# Patient Record
Sex: Male | Born: 1965
Health system: Southern US, Community
[De-identification: ages and names within clinical notes are randomized; demographics above are authoritative.]

## PROBLEM LIST (undated history)

## (undated) DIAGNOSIS — F32A Depression, unspecified: Secondary | ICD-10-CM

## (undated) DIAGNOSIS — T753XXA Motion sickness, initial encounter: Secondary | ICD-10-CM

## (undated) DIAGNOSIS — F419 Anxiety disorder, unspecified: Secondary | ICD-10-CM

## (undated) DIAGNOSIS — M792 Neuralgia and neuritis, unspecified: Secondary | ICD-10-CM

## (undated) DIAGNOSIS — R29898 Other symptoms and signs involving the musculoskeletal system: Secondary | ICD-10-CM

## (undated) DIAGNOSIS — K645 Perianal venous thrombosis: Secondary | ICD-10-CM

## (undated) DIAGNOSIS — E785 Hyperlipidemia, unspecified: Secondary | ICD-10-CM

## (undated) DIAGNOSIS — M5416 Radiculopathy, lumbar region: Secondary | ICD-10-CM

## (undated) DIAGNOSIS — R454 Irritability and anger: Secondary | ICD-10-CM

## (undated) DIAGNOSIS — T7840XA Allergy, unspecified, initial encounter: Secondary | ICD-10-CM

## (undated) DIAGNOSIS — R42 Dizziness and giddiness: Secondary | ICD-10-CM

## (undated) DIAGNOSIS — G545 Neuralgic amyotrophy: Secondary | ICD-10-CM

## (undated) DIAGNOSIS — M1990 Unspecified osteoarthritis, unspecified site: Secondary | ICD-10-CM

## (undated) DIAGNOSIS — M5126 Other intervertebral disc displacement, lumbar region: Secondary | ICD-10-CM

## (undated) DIAGNOSIS — I10 Essential (primary) hypertension: Secondary | ICD-10-CM

## (undated) DIAGNOSIS — Z972 Presence of dental prosthetic device (complete) (partial): Secondary | ICD-10-CM

## (undated) DIAGNOSIS — F329 Major depressive disorder, single episode, unspecified: Secondary | ICD-10-CM

## (undated) HISTORY — DX: Hyperlipidemia, unspecified: E78.5

## (undated) HISTORY — DX: Essential (primary) hypertension: I10

## (undated) HISTORY — DX: Major depressive disorder, single episode, unspecified: F32.9

## (undated) HISTORY — DX: Allergy, unspecified, initial encounter: T78.40XA

## (undated) HISTORY — DX: Perianal venous thrombosis: K64.5

## (undated) HISTORY — DX: Anxiety disorder, unspecified: F41.9

## (undated) HISTORY — DX: Irritability and anger: R45.4

## (undated) HISTORY — DX: Depression, unspecified: F32.A

---

## 1998-12-15 ENCOUNTER — Encounter: Payer: Self-pay | Admitting: Family Medicine

## 1998-12-15 LAB — CONVERTED CEMR LAB
Blood Glucose, Fasting: 87 mg/dL
RBC count: 5.25 10*6/uL
TSH: 0.74 microintl units/mL

## 1999-11-17 ENCOUNTER — Encounter: Payer: Self-pay | Admitting: Family Medicine

## 1999-11-17 LAB — CONVERTED CEMR LAB: Rapid Strep: POSITIVE

## 2000-07-22 ENCOUNTER — Encounter: Payer: Self-pay | Admitting: Family Medicine

## 2000-07-22 LAB — CONVERTED CEMR LAB: TSH: 1.08 microintl units/mL

## 2004-05-08 ENCOUNTER — Ambulatory Visit: Payer: Self-pay | Admitting: Family Medicine

## 2004-10-16 ENCOUNTER — Ambulatory Visit: Payer: Self-pay | Admitting: Family Medicine

## 2004-10-30 ENCOUNTER — Ambulatory Visit: Payer: Self-pay | Admitting: Family Medicine

## 2005-03-22 ENCOUNTER — Ambulatory Visit: Payer: Self-pay | Admitting: Family Medicine

## 2005-10-29 ENCOUNTER — Ambulatory Visit: Payer: Self-pay | Admitting: Family Medicine

## 2005-10-29 LAB — CONVERTED CEMR LAB
Blood Glucose, Fasting: 101 mg/dL
TSH: 1.29 microintl units/mL

## 2005-12-05 ENCOUNTER — Ambulatory Visit: Payer: Self-pay | Admitting: Family Medicine

## 2005-12-05 LAB — CONVERTED CEMR LAB: Rapid Strep: NEGATIVE

## 2007-08-13 ENCOUNTER — Ambulatory Visit: Payer: Self-pay | Admitting: Family Medicine

## 2007-08-13 ENCOUNTER — Encounter: Payer: Self-pay | Admitting: Family Medicine

## 2007-08-13 DIAGNOSIS — F341 Dysthymic disorder: Secondary | ICD-10-CM

## 2007-08-13 DIAGNOSIS — T7840XA Allergy, unspecified, initial encounter: Secondary | ICD-10-CM | POA: Insufficient documentation

## 2007-08-13 DIAGNOSIS — R0602 Shortness of breath: Secondary | ICD-10-CM

## 2007-08-17 ENCOUNTER — Emergency Department (HOSPITAL_COMMUNITY): Admission: EM | Admit: 2007-08-17 | Discharge: 2007-08-17 | Payer: Self-pay | Admitting: Emergency Medicine

## 2007-08-17 ENCOUNTER — Encounter: Payer: Self-pay | Admitting: Family Medicine

## 2007-08-21 ENCOUNTER — Ambulatory Visit: Payer: Self-pay | Admitting: Family Medicine

## 2007-09-03 ENCOUNTER — Encounter: Payer: Self-pay | Admitting: Family Medicine

## 2007-10-16 ENCOUNTER — Encounter: Payer: Self-pay | Admitting: Family Medicine

## 2007-11-19 ENCOUNTER — Encounter: Payer: Self-pay | Admitting: Family Medicine

## 2007-12-17 ENCOUNTER — Telehealth: Payer: Self-pay | Admitting: Family Medicine

## 2007-12-22 ENCOUNTER — Telehealth: Payer: Self-pay | Admitting: Family Medicine

## 2008-05-05 ENCOUNTER — Encounter: Payer: Self-pay | Admitting: Family Medicine

## 2008-05-31 ENCOUNTER — Ambulatory Visit: Payer: Self-pay | Admitting: Family Medicine

## 2008-08-26 ENCOUNTER — Ambulatory Visit: Payer: Self-pay | Admitting: Family Medicine

## 2008-12-16 ENCOUNTER — Ambulatory Visit: Payer: Self-pay | Admitting: Family Medicine

## 2008-12-27 ENCOUNTER — Telehealth: Payer: Self-pay | Admitting: Family Medicine

## 2009-04-27 ENCOUNTER — Ambulatory Visit: Payer: Self-pay | Admitting: Family Medicine

## 2009-05-04 ENCOUNTER — Ambulatory Visit: Payer: Self-pay | Admitting: Family Medicine

## 2009-05-04 DIAGNOSIS — E785 Hyperlipidemia, unspecified: Secondary | ICD-10-CM

## 2009-05-06 ENCOUNTER — Ambulatory Visit: Payer: Self-pay | Admitting: Family Medicine

## 2009-05-09 LAB — CONVERTED CEMR LAB
AST: 21 units/L (ref 0–37)
Albumin: 4.4 g/dL (ref 3.5–5.2)
Alkaline Phosphatase: 47 units/L (ref 39–117)
BUN: 18 mg/dL (ref 6–23)
Basophils Absolute: 0 10*3/uL (ref 0.0–0.1)
CO2: 30 meq/L (ref 19–32)
Calcium: 9.4 mg/dL (ref 8.4–10.5)
Cholesterol: 234 mg/dL — ABNORMAL HIGH (ref 0–200)
Creatinine, Ser: 1 mg/dL (ref 0.4–1.5)
Direct LDL: 173.9 mg/dL
Eosinophils Absolute: 0 10*3/uL (ref 0.0–0.7)
GFR calc non Af Amer: 86.33 mL/min (ref >60)
Glucose, Bld: 111 mg/dL — ABNORMAL HIGH (ref 70–99)
HDL: 45.7 mg/dL (ref >39.00)
Hemoglobin: 14.2 g/dL (ref 13.0–17.0)
Lymphocytes Relative: 13.5 % (ref 12.0–46.0)
MCHC: 33.3 g/dL (ref 30.0–36.0)
Monocytes Relative: 4.4 % (ref 3.0–12.0)
Neutro Abs: 5.7 10*3/uL (ref 1.4–7.7)
Neutrophils Relative %: 81.8 % — ABNORMAL HIGH (ref 43.0–77.0)
Platelets: 288 10*3/uL (ref 150.0–400.0)
RDW: 12.2 % (ref 11.5–14.6)
Sodium: 138 meq/L (ref 135–145)
Total Bilirubin: 0.9 mg/dL (ref 0.3–1.2)
Triglycerides: 86 mg/dL (ref 0.0–149.0)

## 2009-05-11 ENCOUNTER — Ambulatory Visit: Payer: Self-pay | Admitting: Family Medicine

## 2009-06-10 ENCOUNTER — Ambulatory Visit: Payer: Self-pay | Admitting: Family Medicine

## 2009-06-12 LAB — CONVERTED CEMR LAB
ALT: 26 units/L (ref 0–53)
AST: 25 units/L (ref 0–37)

## 2009-06-27 ENCOUNTER — Ambulatory Visit: Payer: Self-pay | Admitting: Family Medicine

## 2009-06-27 DIAGNOSIS — K645 Perianal venous thrombosis: Secondary | ICD-10-CM

## 2009-06-27 DIAGNOSIS — H60399 Other infective otitis externa, unspecified ear: Secondary | ICD-10-CM | POA: Insufficient documentation

## 2009-07-25 ENCOUNTER — Ambulatory Visit: Payer: Self-pay | Admitting: Family Medicine

## 2009-07-25 LAB — CONVERTED CEMR LAB
AST: 26 units/L (ref 0–37)
Total CHOL/HDL Ratio: 5
VLDL: 13.4 mg/dL (ref 0.0–40.0)

## 2009-07-28 ENCOUNTER — Ambulatory Visit: Payer: Self-pay | Admitting: Family Medicine

## 2009-11-01 ENCOUNTER — Encounter (INDEPENDENT_AMBULATORY_CARE_PROVIDER_SITE_OTHER): Payer: Self-pay | Admitting: *Deleted

## 2009-11-04 ENCOUNTER — Ambulatory Visit: Payer: Self-pay | Admitting: Family Medicine

## 2009-11-04 DIAGNOSIS — R454 Irritability and anger: Secondary | ICD-10-CM | POA: Insufficient documentation

## 2010-01-20 ENCOUNTER — Telehealth: Payer: Self-pay | Admitting: Family Medicine

## 2010-01-20 ENCOUNTER — Encounter: Payer: Self-pay | Admitting: Family Medicine

## 2010-03-14 ENCOUNTER — Ambulatory Visit: Payer: Self-pay | Admitting: Family Medicine

## 2010-03-14 DIAGNOSIS — J019 Acute sinusitis, unspecified: Secondary | ICD-10-CM

## 2010-04-25 NOTE — Assessment & Plan Note (Signed)
Summary: INNER EAR/CLE   Vital Signs:  Patient profile:   45 year old male Weight:      216 pounds Temp:     98.5 degrees F oral Pulse rate:   64 / minute Pulse rhythm:   regular BP sitting:   108 / 68  (left arm) Cuff size:   large  Vitals Entered By: Sydell Axon LPN (June 27, 1608 12:24 PM) CC: Severe pain in right ear   History of Present Illness: His right ear hurts when he bites and swallows.  He has no fever or chills, no headache no congestion, no ST, no rhinitis. He has some hay fever and sneezing. No cough. Since reading Protein [employer]wer, he started using it and has lost lots of weight and has pain with buring and knife/needle sticking on the rectum. It is a sensation he has all the time. Sittiing hurts, not sure if this started with the dietary changes. He is eating lots of protein and trying to limit carbs to 40mg . Hard to do.  He thinks he is getting plenty of fiber but admits to having a hard stool occasionally.  Problems Prior to Update: 1)  Health Maintenance Exam  (ICD-V70.0) 2)  Special Screening Malignant Neoplasm of Prostate  (ICD-V76.44) 3)  Hyperlipidemia  (ICD-272.4) 4)  Asthma (DR [facility])  (ICD-493.90) 5)  Allergic Rhinitis, Vasomotor  (ICD-477.9) 6)  Depression/anxiety  (ICD-300.4) 7)  Sob  (ICD-786.05) 8)  Allergy, Environmental  (ICD-995.3)  Medications Prior to Update: 1)  Fluoxetine Hcl 20 Mg  Caps (Fluoxetine Hcl) .... Take One By Mouth Daily 2)  Advair Diskus 100-50 Mcg/dose Aepb (Fluticasone-Salmeterol) .... One Inh Two Times A Day 3)  Optivar 0.05 % Soln (Azelastine Hcl) .... One Drop Each Eye As Needed 4)  Proair Hfa 108 (90 Base) Mcg/act Aers (Albuterol Sulfate) .... As Needed 5)  Prednisone 10 Mg Tabs (Prednisone) .... 5 Tabs By Mouth Today, Then Decrease By 1 Every Two Days Until Finally On 1/2 Tab For Four Days. 6)  Simvastatin 40 Mg Tabs (Simvastatin) .... One Tab By Mouth At Night  Allergies: No Known Drug Allergies  Physical  Exam  General:  Well-developed,well-nourished,in no acute distress; alert,appropriate and cooperative throughout examination, noncongested. Head:  normocephalic, atraumatic, and no abnormalities observed. Sinuses NT. Eyes:  Conjunctiva clear bilaterally.  Ears:  External ear exam shows no significant lesions or deformities but has significant discomfort with mobilization of the pinna on the right. .  Otoscopic examination reveals clear canal on the left, mildly inflame rweasonably torturous canal on the right, tympanic membranes are intact bilaterally without bulging, retraction, inflammation or discharge. Hearing is grossly normal bilaterally. Nose:  nares are clear. Mouth:  pharynx pink and moist, no erythema, and no exudates.   Lungs:  Normal respiratory effort, chest expands symmetrically. Lungs are clear to auscultation, no crackles or wheezes. Heart:  Normal rate and regular rhythm. S1 and S2 normal without gallop, murmur, click, rub or other extra sounds. Abdomen:  Bowel sounds positive,abdomen soft and non-tender without masses, organomegaly or hernias noted. Rectal:  No external abnormalities noted. Normal sphincter tone. No rectal masses but significant discomfort with digital exam.  Anoscopy shows fairly circumferentail internal hemm with irritation in the cana, c/w discomfort he experiences in location.   Impression & Recommendations:  Problem # 1:  OTITIS EXTERNA, RIGHT (ICD-380.10) Assessment New  Use Cortisporin Otic His updated medication list for this problem includes:    Cortisporin 3.5-10000-1 Soln (Neomycin-polymyxin-hc) .Marland Kitchen... 2 gtts right  ear three times a day for minimum of one week.  Discussed symptomatic treatment and preventive measures.   Problem # 2:  HEMORRHOIDS, INTERNAL, THROMBOSED (ICD-455.1) Assessment: New  Use Ab=nusol and Sitz baths. Add more fiober to diet...suggested Citrucel.  Orders: Anoscopy (28413)  Complete Medication List: 1)  Fluoxetine  Hcl 20 Mg Caps (Fluoxetine hcl) .... Take one by mouth daily 2)  Advair Diskus 100-50 Mcg/dose Aepb (Fluticasone-salmeterol) .... One inh two times a day 3)  Optivar 0.05 % Soln (Azelastine hcl) .... One drop each eye as needed 4)  Proair Hfa 108 (90 Base) Mcg/act Aers (Albuterol sulfate) .... As needed 5)  Simvastatin 40 Mg Tabs (Simvastatin) .... One tab by mouth at night 6)  Anusol-hc 25 Mg Supp (Hydrocortisone acetate) .... One supp per rectum for one week then as needed 7)  Cortisporin 3.5-10000-1 Soln (Neomycin-polymyxin-hc) .... 2 gtts right ear three times a day for minimum of one week.  Patient Instructions: 1)  Use Cortisporin as dir. 2)  Use anusol HC and Citrucel, as dir. Prescriptions: CORTISPORIN 3.5-10000-1 SOLN (NEOMYCIN-POLYMYXIN-HC) 2 gtts right ear three times a day for minimum of one week.  #1 bottle x 0   Entered and Authorized by:   Shaune Leeks MD   Signed by:   Shaune Leeks MD on 06/27/2009   Method used:   [facility]        [facility]  [id number]ret[facility]       [address]       [place], [place]  [address]       Ph: [phone] or [phone]       Fax: [phone]   RxID:   [id number] ANUSOL-HC 25 MG SUPP (HYDROCORTISONE ACETATE) one supp per rectum for one week then as needed  #30 x 6   Entered and Authorized by:   Shaune Leeks MD   Signed by:   Shaune Leeks MD on 06/27/2009   Method used:   [facility]        [facility]  [address] (retail[facility]       [address]       [place], [place]  [address]       Ph: [phone] or [phone]       Fax: [phone]   RxID:   [id number]   Current Allergies (reviewed today): No known allergies

## 2010-04-25 NOTE — Assessment & Plan Note (Signed)
Summary: ONE WEEK FOLLOW UP / LFW   Vital Signs:  Patient profile:   45 year old male Weight:      223 pounds Temp:     98.5 degrees F oral Pulse rate:   76 / minute Pulse rhythm:   regular BP sitting:   130 / 80  (left arm) Cuff size:   large  Vitals Entered By: Sydell Axon LPN (May 04, 2009 8:50 AM) CC: One week follow-up   History of Present Illness: Pt here for f/u RAD and URI. He is breathing much better and starting to get congestion up. He took Ceftin and prednisone. He has been on Asmanex but still has mild wheeze. I had him use Advair for one week. He also would like to get his Chol checked next week. Has appt.  Problems Prior to Update: 1)  Sinusitis - Acute-nos  (ICD-461.9) 2)  L Achilles Tendinitis  (ICD-726.71) 3)  Benign Positional Vertigo  (ICD-386.11) 4)  Asthma (DR [facility])  (ICD-493.90) 5)  Allergic Rhinitis, Vasomotor  (ICD-477.9) 6)  Vasovagal Syncope  (ICD-780.2) 7)  Depression/anxiety  (ICD-300.4) 8)  Sob  (ICD-786.05) 9)  Allergy, Environmental  (ICD-995.3)  Medications Prior to Update: 1)  Fluoxetine Hcl 20 Mg  Caps (Fluoxetine Hcl) .... Take One By Mouth Daily 2)  Asmanex 60 Metered Doses 220 Mcg/inh Aepb (Mometasone Furoate) .... As Needed 3)  Optivar 0.05 % Soln (Azelastine Hcl) .... One Drop Each Eye As Needed 4)  Proair Hfa 108 (90 Base) Mcg/act Aers (Albuterol Sulfate) .... As Needed 5)  Ceftin 500 Mg Tabs (Cefuroxime Axetil) .... One Tab By Mouth Two Times A Day 6)  Prednisone 50 Mg Tabs (Prednisone) .... One Tab By Mouth in Am  Allergies: No Known Drug Allergies  Physical Exam  General:  Well-developed,well-nourished,in no acute distress; alert,appropriate and cooperative throughout examination, congested...greatly improved. Head:  normocephalic, atraumatic, and no abnormalities observed. Sinuses NT. Eyes:  Conjunctiva clear bilaterally.  Ears:  R ear normal and L ear normal.   Nose:  nares are minimally  boggy but clear Mouth:   pharynx pink and moist, no erythema, and no exudates.   Neck:  No deformities, masses, or tenderness noted. Lungs:  CTA with very min fine exp wheeze on left (worse on forced exp) no prolonged exp phase or labored breathing  no rales or crackles Heart:  Normal rate and regular rhythm. S1 and S2 normal without gallop, murmur, click, rub or other extra sounds.   Impression & Recommendations:  Problem # 1:  SINUSITIS - ACUTE-NOS (ICD-461.9) Assessment Improved Resolved. His updated medication list for this problem includes:    Ceftin 500 Mg Tabs (Cefuroxime axetil) ..... One tab by mouth two times a day  Problem # 2:  ASTHMA (DR [facility]) (ICD-493.90) Assessment: Improved  Pulse again with steroid. Change Asmanex to Advair. The following medications were removed from the medication list:    Prednisone 50 Mg Tabs (Prednisone) ..... One tab by mouth in am His updated medication list for this problem includes:    Advair Diskus 100-50 Mcg/dose Aepb (Fluticasone-salmeterol) ..... One inh two times a day    Proair Hfa 108 (90 Base) Mcg/act Aers (Albuterol sulfate) .Marland Kitchen... As needed    Prednisone 10 Mg Tabs (Prednisone) .Marland KitchenMarland KitchenMarland KitchenMarland Kitchen 5 tabs by mouth today, then decrease by 1 every two days until finally on 1/2 tab for four days.  Pulmonary Functions Reviewed: O2 sat: 99 (04/27/2009)  Problem # 3:  HYPERLIPIDEMIA (ICD-272.4) Assessment: New Will recheck  next week and discuss when here.  Problem # 4:  DEPRESSION/ANXIETY (ICD-300.4) Assessment: Improved Doing well at present.  Complete Medication List: 1)  Fluoxetine Hcl 20 Mg Caps (Fluoxetine hcl) .... Take one by mouth daily 2)  Advair Diskus 100-50 Mcg/dose Aepb (Fluticasone-salmeterol) .... One inh two times a day 3)  Optivar 0.05 % Soln (Azelastine hcl) .... One drop each eye as needed 4)  Proair Hfa 108 (90 Base) Mcg/act Aers (Albuterol sulfate) .... As needed 5)  Ceftin 500 Mg Tabs (Cefuroxime axetil) .... One tab by mouth two times a  day 6)  Prednisone 10 Mg Tabs (Prednisone) .... 5 tabs by mouth today, then decrease by 1 every two days until finally on 1/2 tab for four days.  Patient Instructions: 1)  RTC for fasting labs prior to next appt next week. Prescriptions: PREDNISONE 10 MG TABS (PREDNISONE) 5 tabs by mouth today, then decrease by 1 every two days until finally on 1/2 tab for four days.  #27 x 0   Entered and Authorized by:   Shaune Leeks MD   Signed by:   Shaune Leeks MD on 05/04/2009   Method used:   [facility]        [facility]  [address] [facility]       [address]       [place], [place]  [address]       Ph: [phone] or [phone]       Fax: [phone]   RxID:   [id number] ADVAIR DISKUS 100-50 MCG/DOSE AEPB (FLUTICASONE-SALMETEROL) one inh two times a day  #1 discus x 12   Entered and Authorized by:   Shaune Leeks MD   Signed by:   Shaune Leeks MD on 05/04/2009   Method used:   [facility]        [facility]  [address] ([facility]ail)       [address]       [place], [place]  [address]       Ph: [phone] or [phone]       Fax: [phone]   RxID:   [id number]   Current Allergies (reviewed today): No known allergies

## 2010-04-25 NOTE — Assessment & Plan Note (Signed)
Summary: discuss meds/alc   Vital Signs:  Patient profile:   45 year old male Height:      75 inches Weight:      216.75 pounds BMI:     27.19 Temp:     98.2 degrees F oral Pulse rate:   80 / minute Pulse rhythm:   regular BP sitting:   110 / 80  (left arm) Cuff size:   large  Vitals Entered By: Delilah Shan CMA Duncan Dull) (11/16/09 4:08 PM) CC: Discuss meds   History of Present Illness: H/o depression after sig family upheaval several years ago, on prozac since then.  No depressive symptoms now.  No h/o mania.  continues to have episodic irritability.   "I'll have a drop in sugar or something and I get very irritable."  "I get heated all over, like I have a short fuse."  Worse after lifting weights/exertion or if eating high carb diet.  Predates the SSRI use.  Present in all of adulthood.  It gets better after eating.  No documented hypoglycemia.  At this point, it is unclear how much benefit patient is getting from SSRI.   Allergies: No Known Drug Allergies  Past History:  Family History: Last updated: 11-16-2009 Father:DECEASED Mar 19, 1998 CHRISTMAS EVE ::ETOH/CAD/ DM--COMPLICATIONS  Mother: A 67  CATARACTS Siblings: 7 BROTHERS// 4 SISTERS SOME with HTN  SOME w/ Kidney Probs CV: +PGF CABG X 2 //  +FATHER HBP:+ FATHER ; +SISTERS; + OLDER BROTHER DM: + FATHER GOUT/ARTHRITIS: PROSTATE CANCER: BREAST/OVARIAN/UTERINE CANCER: NEGATIVE COLON CANCER: DEPRESSION : NEGATIVE FATHERS SIDE ETOH ABUSE: + FATHER OTHER: NEGATIVE STROKE HYPOGYLCEMIA : + SELF AND 2 SISTERS  Social History: Last updated: 11/16/09 Marital Status: Married LIVES WITH WIFE Children: 1 CHILD Occupation: ELECTRICAL ENGINEER  --[place] non smoker minimal alcohol  [employer] since 1993  Past Medical History: depression rhinitis due to chemicals at work  Family History: Father:DECEASED March 19, 1998 CHRISTMAS EVE ::ETOH/CAD/ DM--COMPLICATIONS  Mother: A 29  CATARACTS Siblings: 7 BROTHERS// 4 SISTERS  SOME with HTN  SOME w/ Kidney Probs CV: +PGF CABG X 2 //  +FATHER HBP:+ FATHER ; +SISTERS; + OLDER BROTHER DM: + FATHER GOUT/ARTHRITIS: PROSTATE CANCER: BREAST/OVARIAN/UTERINE CANCER: NEGATIVE COLON CANCER: DEPRESSION : NEGATIVE FATHERS SIDE ETOH ABUSE: + FATHER OTHER: NEGATIVE STROKE HYPOGYLCEMIA : + SELF AND 2 SISTERS  Social History: Reviewed history from 12/16/2008 and no changes required. 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  Otherwise negative.    Physical Exam  General:  GEN: nad, alert and oriented, affect wnl HEENT: mucous membranes moist, rhinitis noted NECK: supple w/o LA CV: rrr.  no murmur PULM: ctab, no inc wob ABD: soft, +bs EXT: no edema SKIN: no acute rash    Impression & Recommendations:  Problem # 1:  IRRITABILITY (UUV-253.66) Dec SSRI and monitor glucose during events.  I am unsure if this is related to low glucose or another cause.   No SI/HI. OK for outpatient follow up. He will report back with symptoms.  At this point, patient would like to lower dose of SSRI  and I think this is appropriate.   Complete Medication List: 1)  Fluoxetine Hcl 20 Mg Caps (Fluoxetine hcl) .... Take 1/2 tab by mouth daily 2)  Advair Diskus 100-50 Mcg/dose Aepb (Fluticasone-salmeterol) .... One inh two times a day 3)  Optivar 0.05 %  Soln (Azelastine hcl) .... One drop each eye as needed 4)  Proair Hfa 108 (90 Base) Mcg/act Aers (Albuterol sulfate) .... As needed 5)  Simvastatin 40 Mg Tabs (Simvastatin) .... Take 1/2  by mouth at night  Patient Instructions: 1)  Check your sugar if you notice symptoms and cut the fluoxetine to 10mg  a day (1/2 of a 20mg  tab).  Call back and let me know how you are doing in a few weeks.  Current Allergies (reviewed today): No known allergies

## 2010-04-25 NOTE — Assessment & Plan Note (Signed)
Summary: LAB FIRST THEN APPT FOR CHOL/JRR   Vital Signs:  Patient profile:   45 year old male Weight:      212.75 pounds Temp:     98.8 degrees F oral Pulse rate:   64 / minute Pulse rhythm:   regular BP sitting:   108 / 74  (left arm) Cuff size:   large  Vitals Entered By: Sydell Axon LPN (Jul 29, 1322 3:23 PM) CC: Follow-up on cholesterol, taking only 1/2 of his Simvastatin   History of Present Illness: Pt here for followup of chol after starting on Simvastatin. However he is omnly on 1/2 pill as he also started careful Carb restriction via [employer] and has been very compliant. He has no complaints today and feels well.  Problems Prior to Update: 1)  Hemorrhoids, Internal, Thrombosed  (ICD-455.1) 2)  Otitis Externa, Right  (ICD-380.10) 3)  Health Maintenance Exam  (ICD-V70.0) 4)  Special Screening Malignant Neoplasm of Prostate  (ICD-V76.44) 5)  Hyperlipidemia  (ICD-272.4) 6)  Asthma (DR [facility])  (ICD-493.90) 7)  Allergic Rhinitis, Vasomotor  (ICD-477.9) 8)  Depression/anxiety  (ICD-300.4) 9)  Sob  (ICD-786.05) 10)  Allergy, Environmental  (ICD-995.3)  Medications Prior to Update: 1)  Fluoxetine Hcl 20 Mg  Caps (Fluoxetine Hcl) .... Take One By Mouth Daily 2)  Advair Diskus 100-50 Mcg/dose Aepb (Fluticasone-Salmeterol) .... One Inh Two Times A Day 3)  Optivar 0.05 % Soln (Azelastine Hcl) .... One Drop Each Eye As Needed 4)  Proair Hfa 108 (90 Base) Mcg/act Aers (Albuterol Sulfate) .... As Needed 5)  Simvastatin 40 Mg Tabs (Simvastatin) .... One Tab By Mouth At Night 6)  Anusol-Hc 25 Mg Supp (Hydrocortisone Acetate) .... One Supp Per Rectum For One Week Then As Needed 7)  Cortisporin 3.5-10000-1 Soln (Neomycin-Polymyxin-Hc) .... 2 Gtts Right Ear Three Times A Day For Minimum of One Week.  Allergies: No Known Drug Allergies  Physical Exam  General:  Well-developed,well-nourished,in no acute distress; alert,appropriate and cooperative throughout examination,  noncongested. Head:  normocephalic, atraumatic, and no abnormalities observed. Sinuses NT. Eyes:  Conjunctiva clear bilaterally.  Ears:  External ear exam shows no significant lesions or deformities but has significant discomfort with mobilization of the pinna on the right. .  Otoscopic examination reveals clear canal on the left, mildly inflame rweasonably torturous canal on the right, tympanic membranes are intact bilaterally without bulging, retraction, inflammation or discharge. Hearing is grossly normal bilaterally. Nose:  nares are clear. Mouth:  pharynx pink and moist, no erythema, and no exudates.   Neck:  No deformities, masses, or tenderness noted. Lungs:  Normal respiratory effort, chest expands symmetrically. Lungs are clear to auscultation, no crackles or wheezes. Heart:  Normal rate and regular rhythm. S1 and S2 normal without gallop, murmur, click, rub or other extra sounds.   Impression & Recommendations:  Problem # 1:  HYPERLIPIDEMIA (ICD-272.4) Trigs and LDL better, HDL lower but he is willing to incr tioi whole pill. He has had no probs with Simvastatin. Will recheck 9-10/11. He feels vbetter on the low carb diet and has lost signif weight. His updated medication list for this problem includes:    Simvastatin 40 Mg Tabs (Simvastatin) .Marland Kitchen... Take 1/2  by mouth at night  Labs Reviewed: SGOT: 26 (07/25/2009)   SGPT: 28 (07/25/2009)   HDL:38.80 (07/25/2009), 45.70 (05/06/2009)  Chol:204 (07/25/2009), 234 (05/06/2009)  Trig:67.0 (07/25/2009), 86.0 (05/06/2009)  Complete Medication List: 1)  Fluoxetine Hcl 20 Mg Caps (Fluoxetine hcl) .... Take one by mouth  daily 2)  Advair Diskus 100-50 Mcg/dose Aepb (Fluticasone-salmeterol) .... One inh two times a day 3)  Optivar 0.05 % Soln (Azelastine hcl) .... One drop each eye as needed 4)  Proair Hfa 108 (90 Base) Mcg/act Aers (Albuterol sulfate) .... As needed 5)  Simvastatin 40 Mg Tabs (Simvastatin) .... Take 1/2  by mouth at  night 6)  Anusol-hc 25 Mg Supp (Hydrocortisone acetate) .... One supp per rectum for one week then as needed  Patient Instructions: 1)  RTC 9-10/11 for recheck SGOT,SGPT, Chol Prof prior. He will call.  Current Allergies (reviewed today): No known allergies

## 2010-04-25 NOTE — Assessment & Plan Note (Signed)
Summary: DISCUSS CHOLESTEROL / LFW   Vital Signs:  Patient profile:   45 year old male Weight:      225.75 pounds Temp:     98.4 degrees F oral Pulse rate:   60 / minute Pulse rhythm:   regular BP sitting:   116 / 80  (left arm) Cuff size:   large  Vitals Entered By: Sydell Axon LPN (2009-06-04 8:45 AM) CC: Discuss choloesterol   History of Present Illness: Pt here for followup from breathing difficulties. Will do Comp Exam now, had labs done. His breathing is great. His asthma is related to a particular room at [facility] and has learned to avoid it. This last time he had to be in there 12 hrs a day for a few and had his latest exacerbation.  Preventive Screening-Counseling & Management  Alcohol-Tobacco     Alcohol drinks/day: 0     Smoking Status: current     Packs/Day: 0.5     Year Started: 1986     Year Quit: 2006     Pack years: 15  Caffeine-Diet-Exercise     Caffeine use/day: 5     Does Patient Exercise: no  Problems Prior to Update: 1)  Health Maintenance Exam  (ICD-V70.0) 2)  Special Screening Malignant Neoplasm of Prostate  (ICD-V76.44) 3)  Hyperlipidemia  (ICD-272.4) 4)  Asthma (DR [facility])  (ICD-493.90) 5)  Allergic Rhinitis, Vasomotor  (ICD-477.9) 6)  Depression/anxiety  (ICD-300.4) 7)  Sob  (ICD-786.05) 8)  Allergy, Environmental  (ICD-995.3)  Medications Prior to Update: 1)  Fluoxetine Hcl 20 Mg  Caps (Fluoxetine Hcl) .... Take One By Mouth Daily 2)  Advair Diskus 100-50 Mcg/dose Aepb (Fluticasone-Salmeterol) .... One Inh Two Times A Day 3)  Optivar 0.05 % Soln (Azelastine Hcl) .... One Drop Each Eye As Needed 4)  Proair Hfa 108 (90 Base) Mcg/act Aers (Albuterol Sulfate) .... As Needed 5)  Ceftin 500 Mg Tabs (Cefuroxime Axetil) .... One Tab By Mouth Two Times A Day 6)  Prednisone 10 Mg Tabs (Prednisone) .... 5 Tabs By Mouth Today, Then Decrease By 1 Every Two Days Until Finally On 1/2 Tab For Four Days.  Allergies: No Known Drug Allergies  Past  History:  Family History: Last updated: 04-Jun-2009 Father:DECEASED 03-30-1998 CHRISTMAS EVE ::ETOH/CAD/ DM--COMPLICATIONS  Mother: A 67  CATARRACTS Siblings: 7 BROTHERS// 4 SISTERS SOME with HTN  SOME w/ Kidney Probs CV: +PGF CABG X 2 //  +FATHER HBP:+ FATHER ; +SISTERS; + OLDER BROTHER DM: + FATHER GOUT/ARTHRITIS: PROSTATE CANCER: BREAST/OVARIAN/UTERINE CANCER: NEGATIVE COLON CANCER: DEPRESSION : NEGATIVE FATHERS SIDE ETOH ABUSE: + FATHER OTHER: NEGATIVE STROKE HYPOGYLCEMIA : + SELF AND 2 SISTERS  Social History: Last updated: 12/16/2008 Marital Status: Married LIVES WITH WIFE Children: 1 CHILD Occupation: ELECTRICAL ENGINEER  --[place] non smoker  Risk Factors: Alcohol Use: 0 (Jun 04, 2009) Caffeine Use: 5 (2009-06-04) Exercise: no (06/04/2009)  Risk Factors: Smoking Status: current (2009-06-04) Packs/Day: 0.5 (Jun 04, 2009)  Family History: Father:DECEASED 1998-03-30 CHRISTMAS EVE ::ETOH/CAD/ DM--COMPLICATIONS  Mother: A 59  CATARRACTS Siblings: 7 BROTHERS// 4 SISTERS SOME with HTN  SOME w/ Kidney Probs CV: +PGF CABG X 2 //  +FATHER HBP:+ FATHER ; +SISTERS; + OLDER BROTHER DM: + FATHER GOUT/ARTHRITIS: PROSTATE CANCER: BREAST/OVARIAN/UTERINE CANCER: NEGATIVE COLON CANCER: DEPRESSION : NEGATIVE FATHERS SIDE ETOH ABUSE: + FATHER OTHER: NEGATIVE STROKE HYPOGYLCEMIA : + SELF AND 2 SISTERS  Social History: Packs/Day:  0.5 Caffeine use/day:  5 Does Patient Exercise:  no  Review of Systems General:  Denies chills,  fatigue, fever, sweats, weakness, and weight loss. Eyes:  Denies blurring, discharge, and eye pain. ENT:  Denies decreased hearing, ear discharge, earache, and ringing in ears. CV:  Denies chest pain or discomfort, fainting, fatigue, palpitations, shortness of breath with exertion, and swelling of feet. Resp:  Complains of shortness of breath and wheezing; denies cough; occas with RAD. GI:  Denies abdominal pain, bloody stools, change in bowel habits,  constipation, dark tarry stools, diarrhea, indigestion, loss of appetite, nausea, vomiting, vomiting blood, and yellowish skin color. GU:  Denies discharge, dysuria, and urinary frequency. MS:  Denies joint pain, joint redness, joint swelling, low back pain, muscle aches, cramps, and stiffness. Derm:  Denies dryness, itching, and rash. Neuro:  Denies numbness, poor balance, tingling, and tremors.  Physical Exam  General:  Well-developed,well-nourished,in no acute distress; alert,appropriate and cooperative throughout examination, congested...greatly improved and basck to normal.. Head:  normocephalic, atraumatic, and no abnormalities observed. Sinuses NT. Eyes:  Conjunctiva clear bilaterally.  Ears:  R ear normal and L ear normal.   Nose:  nares are clear. Mouth:  pharynx pink and moist, no erythema, and no exudates.   Neck:  No deformities, masses, or tenderness noted. Chest Wall:  No deformities, masses, tenderness or gynecomastia noted. Breasts:  No masses or gynecomastia noted Lungs:  Normal respiratory effort, chest expands symmetrically. Lungs are clear to auscultation, no crackles or wheezes. Heart:  Normal rate and regular rhythm. S1 and S2 normal without gallop, murmur, click, rub or other extra sounds. Abdomen:  Bowel sounds positive,abdomen soft and non-tender without masses, organomegaly or hernias noted. Rectal:  No external abnormalities noted. Normal sphincter tone. No rectal masses or tenderness. G neg. Genitalia:  Testes bilaterally descended without nodularity, tenderness or masses. No scrotal masses or lesions. No penis lesions or urethral discharge. Prostate:  Prostate gland firm and smooth, no enlargement, nodularity, tenderness, mass, asymmetry or induration. 10-20gms. Msk:  No deformity or scoliosis noted of thoracic or lumbar spine.   Pulses:  R and L carotid,radial,femoral,dorsalis pedis and posterior tibial pulses are full and equal bilaterally Extremities:  No  clubbing, cyanosis, edema, or deformity noted with normal full range of motion of all joints.   Neurologic:  No cranial nerve deficits noted. Station and gait are normal. Sensory, motor and coordinative functions appear intact. Skin:  Intact without suspicious lesions or rashes, multiple cystic-like lesions under the skin in various locations. Cervical Nodes:  No lymphadenopathy noted Inguinal Nodes:  No significant adenopathy Psych:  Cognition and judgment appear intact. Alert and cooperative with normal attention span and concentration. No apparent delusions, illusions, hallucinations   Impression & Recommendations:  Problem # 1:  HEALTH MAINTENANCE EXAM (ICD-V70.0) Assessment Comment Only Tdap today.  Problem # 2:  HYPERLIPIDEMIA (ICD-272.4) Assessment: Deteriorated  Start Simva 40 His updated medication list for this problem includes:    Simvastatin 40 Mg Tabs (Simvastatin) ..... One tab by mouth at night  Labs Reviewed: SGOT: 21 (05/06/2009)   SGPT: 29 (05/06/2009)   HDL:45.70 (05/06/2009)  Chol:234 (05/06/2009)  Trig:86.0 (05/06/2009)  Problem # 3:  SPECIAL SCREENING MALIGNANT NEOPLASM OF PROSTATE (ICD-V76.44) Assessment: Unchanged Stable PSA and exam.  Problem # 4:  ASTHMA (DR [facility]) (ICD-493.90) Assessment: Improved  Back to nml. Cont Advair. Avoid room at work. His updated medication list for this problem includes:    Advair Diskus 100-50 Mcg/dose Aepb (Fluticasone-salmeterol) ..... One inh two times a day    Proair Hfa 108 (90 Base) Mcg/act Aers (Albuterol sulfate) .Marland Kitchen... As needed  Prednisone 10 Mg Tabs (Prednisone) .Marland KitchenMarland KitchenMarland KitchenMarland Kitchen 5 tabs by mouth today, then decrease by 1 every two days until finally on 1/2 tab for four days.  Pulmonary Functions Reviewed: O2 sat: 99 (04/27/2009)  Problem # 5:  DEPRESSION/ANXIETY (ICD-300.4) Assessment: Unchanged Stable and well controlled.  Complete Medication List: 1)  Fluoxetine Hcl 20 Mg Caps (Fluoxetine hcl) .... Take one by  mouth daily 2)  Advair Diskus 100-50 Mcg/dose Aepb (Fluticasone-salmeterol) .... One inh two times a day 3)  Optivar 0.05 % Soln (Azelastine hcl) .... One drop each eye as needed 4)  Proair Hfa 108 (90 Base) Mcg/act Aers (Albuterol sulfate) .... As needed 5)  Prednisone 10 Mg Tabs (Prednisone) .... 5 tabs by mouth today, then decrease by 1 every two days until finally on 1/2 tab for four days. 6)  Simvastatin 40 Mg Tabs (Simvastatin) .... One tab by mouth at night  Other Orders: Tdap => 72yrs IM (65784) Admin 1st Vaccine (69629)  Patient Instructions: 1)  SGOT, SGPT 272.  4  6 WEEKS 2)  See me in 3 mos, SGOT, SGPT, CHOL PROFILE  272. 4    prior  Prescriptions: SIMVASTATIN 40 MG TABS (SIMVASTATIN) one tab by mouth at night  #30 x 12   Entered and Authorized by:   Shaune Leeks MD   Signed by:   Shaune Leeks MD on 05/11/2009   Method used:   [facility]        [facility]  [place] (retail)       [address]       [place], [place]  [address]       Ph: [phone] or [phone]       Fax: [phone]   RxID:   [id number]   Current Allergies (reviewed today): No known allergies    Tetanus/Td Vaccine    Vaccine Type: Tdap    Site: left deltoid    Mfr: GlaxoSmithKline    Dose: 0.5 ml    Route: IM    Given by: Sydell Axon LPN    Exp. Date: 05/21/2011    Lot #: [id number]    VIS given: 02/11/07 version given May 11, 2009.

## 2010-04-25 NOTE — Letter (Signed)
Summary: Dean Bishop letter  [place] [facility]  [address] [place], [place] [address]   Phone: [phone]  Fax: [phone]       11/01/2009 MRN: [id number]  [place] [address] [place], [place]  [address]  Dear Dean Bishop,  [facility] [place], and [facility] announce the retirement of Dean Bishop, M.D., from full-time practice at the [facility] office effective September 22, 2009 and his plans of returning part-time.  It is important to Dean Bishop and to our practice that you understand that [facility] has seven physicians in our office for your health care needs.  We will continue to offer the same exceptional care that you have today.    Dean Bishop has spoken to many of you about his plans for retirement and returning part-time in the fall.   We will continue to work with you through the transition to schedule appointments for you in the office and meet the high standards that Dean Bishop is committed to.   Again, it is with great pleasure that we share the news that Dean Bishop will return to [facility] in October of 2011 with a reduced schedule.    If you have any questions, or would like to request an appointment with one of our physicians, please call us at [phone] and press the option for Scheduling an appointment.  We take pleasure in providing you with excellent patient care and look forward to seeing you at your next office visit.  Our [facility] Physicians are:  Dean Bishop, M.D. Dean Bishop, M.D. Dean Bishop, M.D. Dean Bishop, M.D. Dean Bishop, M.D. Dean Bishop, M.D. We proudly welcomed Dean Bishop, M.D. and Dean Bishop, M.D. to the practice in July/August 2011.  Sincerely,  [facility]

## 2010-04-25 NOTE — Assessment & Plan Note (Signed)
Summary: SINUS INFECTION/RBH   Vital Signs:  Patient profile:   45 year old male Weight:      227 pounds O2 Sat:      99 % on Room air Temp:     97.7 degrees F oral Pulse rate:   88 / minute Pulse rhythm:   regular BP sitting:   112 / 72  (left arm) Cuff size:   large  Vitals Entered By: Sydell Axon LPN (April 27, 2009 9:17 AM)  O2 Flow:  Room air CC: Asthma, head congestion, difficulty breathing and lungs burn   History of Present Illness: Pt being seen as same day visit for congestion. He sees Dr Barnetta Chapel, seen last time last summer. He is having sinus congestion which then goes into his lungs. He uses Asthmanex regularly daily, then he has been ubsing Albuterol last night and again this AM. He has used it as much as 4 times a day over the wknd. He has no fever or chills, heaqdache frontally and periorbitally, no ear pain, no rhinitis but nasal congestion greenish yellow when he blows, no ST, cough productive greenish gray, lots of SOB with wheezing, no N/V.    Problems Prior to Update: 1)  L Achilles Tendinitis  (ICD-726.71) 2)  Benign Positional Vertigo  (ICD-386.11) 3)  Asthma (DR [facility])  (ICD-493.90) 4)  Allergic Rhinitis, Vasomotor  (ICD-477.9) 5)  Vasovagal Syncope  (ICD-780.2) 6)  Depression/anxiety  (ICD-300.4) 7)  Sob  (ICD-786.05) 8)  Allergy, Environmental  (ICD-995.3)  Medications Prior to Update: 1)  Fluoxetine Hcl 20 Mg  Caps (Fluoxetine Hcl) .... Take One By Mouth Daily 2)  Asmanex 60 Metered Doses 220 Mcg/inh Aepb (Mometasone Furoate) .... As Needed 3)  Optivar 0.05 % Soln (Azelastine Hcl) .... One Drop Each Eye As Needed 4)  Proair Hfa 108 (90 Base) Mcg/act Aers (Albuterol Sulfate) .... As Needed 5)  Zithromax Z-Pak 250 Mg Tabs (Azithromycin) .... Take By Mouth As Directed 6)  Prednisone 10 Mg Tabs (Prednisone) .... Take By Mouth As Directed  Allergies: No Known Drug Allergies  Physical Exam  General:  Well-developed,well-nourished,in no acute  distress; alert,appropriate and cooperative throughout examination, congested Head:  normocephalic, atraumatic, and no abnormalities observed. Sinuses t3ender max. Eyes:  Conjunctiva clear bilaterally.  Ears:  R ear normal and L ear normal.   Nose:  nares are boggy but clear Mouth:  pharynx pink and moist, no erythema, and no exudates.   Neck:  No deformities, masses, or tenderness noted. Lungs:  CTA with min fine exp wheeze (worse on forced exp) no prolonged exp phase or labored breathing  harsh bs with occ rhonchi at bases  no rales or crackles Heart:  Normal rate and regular rhythm. S1 and S2 normal without gallop, murmur, click, rub or other extra sounds.   Impression & Recommendations:  Problem # 1:  SINUSITIS - ACUTE-NOS (ICD-461.9) Assessment New  See instructions. The following medications were removed from the medication list:    Zithromax Z-pak 250 Mg Tabs (Azithromycin) .Marland Kitchen... Take by mouth as directed His updated medication list for this problem includes:    Ceftin 500 Mg Tabs (Cefuroxime axetil) ..... One tab by mouth two times a day  Instructed on treatment. Call if symptoms persist or worsen.   Problem # 2:  ASTHMA (DR [facility]) (ICD-493.90) Assessment: Deteriorated  See instructions. The following medications were removed from the medication list:    Prednisone 10 Mg Tabs (Prednisone) .Marland Kitchen... Take by mouth as directed His updated  medication list for this problem includes:    Asmanex 60 Metered Doses 220 Mcg/inh Aepb (Mometasone furoate) .Marland Kitchen... As needed    Proair Hfa 108 (90 Base) Mcg/act Aers (Albuterol sulfate) .Marland Kitchen... As needed    Prednisone 50 Mg Tabs (Prednisone) ..... One tab by mouth in am  Pulmonary Functions Reviewed: O2 sat: 99 (04/27/2009)  Complete Medication List: 1)  Fluoxetine Hcl 20 Mg Caps (Fluoxetine hcl) .... Take one by mouth daily 2)  Asmanex 60 Metered Doses 220 Mcg/inh Aepb (Mometasone furoate) .... As needed 3)  Optivar 0.05 % Soln  (Azelastine hcl) .... One drop each eye as needed 4)  Proair Hfa 108 (90 Base) Mcg/act Aers (Albuterol sulfate) .... As needed 5)  Ceftin 500 Mg Tabs (Cefuroxime axetil) .... One tab by mouth two times a day 6)  Prednisone 50 Mg Tabs (Prednisone) .... One tab by mouth in am  Patient Instructions: 1)  Take Ceftin  2)  Take Guaifenesin by [facility] or [facility] and getting MUCOUS RELIEF EXPECTORANT (400mg ), take 11/2 tabs by mouth AM and NOON. 3)  Drink lots of fluids anytime taking Guaifenesin.  4)  Take prednisone 5)  Use Advair foir one week  6)  25 mins spent with pt. Prescriptions: PREDNISONE 50 MG TABS (PREDNISONE) one tab by mouth in AM  #5 x 0   Entered and Authorized by:   Shaune Leeks MD   Signed by:   Shaune Leeks MD on 04/27/2009   Method used:   [facility]        [facility]  [place] (retail)       [address]       [place], [place]  [address]       Ph: [phone] or [phone]       Fax: [phone]   RxID:   [id number] CEFTIN 500 MG TABS (CEFUROXIME AXETIL) one tab by mouth two times a day  #20 x 0   Entered and Authorized by:   Shaune Leeks MD   Signed by:   Shaune Leeks MD on 04/27/2009   Method used:   [facility]        [facility]  [place] (retail)       [address]       [place], [place]  [address]       Ph: [phone] or [phone]       Fax: [phone]   RxID:   [id number]   Current Allergies (reviewed today): No known allergies

## 2010-04-25 NOTE — Miscellaneous (Signed)
Summary: update med list Fluoxetine 10mg   Medications Added FLUOXETINE HCL 10 MG CAPS (FLUOXETINE HCL) Take 1 capsule by mouth once a day       Clinical Lists Changes  Medications: Added new medication of FLUOXETINE HCL 10 MG CAPS (FLUOXETINE HCL) Take 1 capsule by mouth once a day - Signed Removed medication of FLUOXETINE HCL 20 MG  CAPS (FLUOXETINE HCL) Take 1/2 tab by mouth daily Rx of FLUOXETINE HCL 10 MG CAPS (FLUOXETINE HCL) Take 1 capsule by mouth once a day;  #90 x 3;  Signed;  Entered by: Lewanda Rife LPN;  Authorized by: Crawford Givens MD;  Method used: [facility]oned [facility]  [place] [place], [place]  [address], Ph: [phone] or [phone], Fax: [phone]    Prescriptions: FLUOXETINE HCL 10 MG CAPS (FLUOXETINE HCL) Take 1 capsule by mouth once a day  #90 x 3   Entered by:   Lewanda Rife LPN   Authorized by:   Crawford Givens MD   Signed by:   Lewanda Rife LPN on 13/24/4010   Method used:   Telephoned to ...       [facility]  [place] (retail)       [address]       [place], [place]  [address]       Ph: [phone] or [phone]       Fax: [phone]   RxID:   [id number]   Current Allergies: No known allergies

## 2010-04-25 NOTE — Progress Notes (Signed)
Summary: Fluoxetine HCL 20mg  verify instructions and needs refill  Phone Note Refill Request Call back at [phone] Message from:  [facility] on January 20, 2010 10:54 AM  Refills Requested: Medication #1:  FLUOXETINE HCL 20 MG  CAPS Take 1/2 tab by mouth daily [facility] request refill on Fluoxetine Hcl 20mg  with instructions one capsule by mouth daily. When pt saw Dr Para March on 11/04/09 the med list changed instructions to 1/2 tablet by mouth daily. There was no last refill date sent by pharmacy.Please advise.    Method Requested: Telephone to Pharmacy Initial call taken by: Lewanda Rife LPN,  January 20, 2010 10:56 AM  Follow-up for Phone Call        this should be fluoxetine 10mg  a day (1/2 of a 20mg  tab).  please send in #[phone] with 3rf thanks.  Follow-up by: Crawford Givens MD,  January 20, 2010 1:22 PM  Additional Follow-up for Phone Call Additional follow up Details #1::        [facility] spoke with Leonette Most as instructed. Leonette Most said Fluoxetine does not come in tablet form only capsule. Fluoxetine 10mg  one capsule by mouth daily #90 x3. Lewanda Rife LPN  January 20, 2010 1:39 PM

## 2010-04-27 NOTE — Assessment & Plan Note (Signed)
Summary: ?SINUS INFECTION/CLE   Vital Signs:  Patient profile:   45 year old male Height:      75 inches Weight:      226.50 pounds BMI:     28.41 Temp:     98.2 degrees F oral Pulse rate:   76 / minute Pulse rhythm:   regular BP sitting:   130 / 90  (left arm) Cuff size:   large  Vitals Entered By: Delilah Shan CMA [facility]) (March 14, 2010 11:12 AM) CC: ? sinus infection   History of Present Illness: R ear pain.  Pain in R max sinus area and forehead, ST.  Muscles sore in neck.  Still with good range of motion in neck.  Feeling hot off and on.  Getting worse over the last few days.  Pain with talking.  No known fevers.  Took some otc sinus meds for itching eyes with some relief.  Taking tylenol.  No cough.  No sputum.  Sx worse at night.    Allergies (verified): No Known Drug Allergies  Review of Systems       See HPI.  Otherwise negative.    Physical Exam  General:  GEN: nad, alert and oriented HEENT: mucous membranes moist, TM w/o erythema, nasal epithelium injected, OP with cobblestoning NECK: supple w/o LA CV: rrr. PULM: ctab, no inc wob ABD: soft, +bs EXT: no edema  R max sinus tender to palpation    Impression & Recommendations:  Problem # 1:  SINUSITIS - ACUTE-NOS (ICD-461.9) Start antibiotics and supportive tx o/w.  D/w patient and he understood.  Nontoxic.  Okay for outpatient follow up.   His updated medication list for this problem includes:    Amoxicillin 875 Mg Tabs (Amoxicillin) .Marland Kitchen... 1 by mouth two times a day  Orders: Prescription Created Electronically [id number])  Complete Medication List: 1)  Advair Diskus 100-50 Mcg/dose Aepb (Fluticasone-salmeterol) .... One inh two times a day 2)  Optivar 0.05 % Soln (Azelastine hcl) .... One drop each eye as needed 3)  Proair Hfa 108 (90 Base) Mcg/act Aers (Albuterol sulfate) .... As needed 4)  Simvastatin 40 Mg Tabs (Simvastatin) .... Take 1/2  by mouth at night 5)  Fluoxetine Hcl 10 Mg Caps (Fluoxetine  hcl) .... Take 1 capsule by mouth once a day 6)  Amoxicillin 875 Mg Tabs (Amoxicillin) .Marland Kitchen.. 1 by mouth two times a day  Patient Instructions: 1)  Start the antibiotics today.   2)  Get plenty of rest, drink lots of clear liquids, and use Tylenol or Ibuprofen for fever and comfort. Let us know if you aren't improving.  Take care.  Prescriptions: AMOXICILLIN 875 MG TABS (AMOXICILLIN) 1 by mouth two times a day  #20 x 0   Entered and Authorized by:   Crawford Givens MD   Signed by:   Crawford Givens MD on 03/14/2010   Method used:   [facility]        [facility]  [id number]ail)       [address]       [place], [place]  [address]       Ph: [phone] or [phone]       Fax: [phone]   RxID:   [id number]    Orders Added: 1)  Est. Patient Level III [01601] 2)  Prescription Created Electronically [phone]    Current Allergies (reviewed today): No known allergies

## 2010-06-15 ENCOUNTER — Encounter: Payer: Self-pay | Admitting: Family Medicine

## 2010-06-15 ENCOUNTER — Ambulatory Visit (INDEPENDENT_AMBULATORY_CARE_PROVIDER_SITE_OTHER): Payer: 59 | Admitting: Family Medicine

## 2010-06-15 VITALS — BP 118/70 | HR 80 | Temp 98.4°F | Ht 74.0 in | Wt 217.1 lb

## 2010-06-15 DIAGNOSIS — H6690 Otitis media, unspecified, unspecified ear: Secondary | ICD-10-CM | POA: Insufficient documentation

## 2010-06-15 MED ORDER — AMOXICILLIN 875 MG PO TABS: 875.0000 mg | ORAL_TABLET | Freq: Two times a day (BID) | ORAL | Status: DC

## 2010-06-15 NOTE — Progress Notes (Signed)
Usually happens once or twice a year.  It happens after being in certain areas [place].  Has been in those areas and cough/sob was increasing.  Needing inhaler several times a day.  Wheezing more- some better with SABA.  Tired.  This is typical for an exacerbation for the patient.  Occ temps in upper 90s, but not >100.4.  Some sputum, better today.  Prev with discolored sputum. L ear pain last few days.   PMH and SH reviewed  ROS: See HPI, otherwise noncontributory.  Meds, vitals, and allergies reviewed.   GEN: nad, alert and oriented HEENT: mucous membranes moist, L tm with erythema and bulging, R tm wnl, L max sinus ttp, OP with cobblestoning NECK: supple w/o LA CV: rrr. PULM: ctab, no inc wob EXT: no edema SKIN: no acute rash

## 2010-06-15 NOTE — Patient Instructions (Signed)
Start the antibiotics today and try to rest over the weekend.  Use your inhalers and let me know if you aren't improving.  Take care.

## 2010-06-15 NOTE — Assessment & Plan Note (Signed)
Supportive tx and start abx today.  Fu prn.  Out of work today and tomorrow.  Okay to keep using inhalers.  Will not use systemic steroids since he is controlling his sx with prn SABA use in addition to advair.  Nontoxic and okay for outpatient fu.

## 2010-08-11 ENCOUNTER — Other Ambulatory Visit: Payer: Self-pay | Admitting: Family Medicine

## 2010-08-15 ENCOUNTER — Telehealth: Payer: Self-pay | Admitting: *Deleted

## 2010-08-15 MED ORDER — SCOPOLAMINE 1 MG/3DAYS TD PT72: 1.0000 | MEDICATED_PATCH | TRANSDERMAL | Status: DC

## 2010-08-15 NOTE — Telephone Encounter (Signed)
Pt is going [place] fishing for 2 days and is asking that transderm scop patches be called [facility].  He takes meclizine for dizziness but say that doesn't help with his motion sickness.

## 2010-08-15 NOTE — Telephone Encounter (Signed)
Sent!

## 2010-10-17 ENCOUNTER — Encounter: Payer: Self-pay | Admitting: Family Medicine

## 2010-10-17 ENCOUNTER — Ambulatory Visit (INDEPENDENT_AMBULATORY_CARE_PROVIDER_SITE_OTHER): Payer: 59 | Admitting: Family Medicine

## 2010-10-17 VITALS — BP 122/88 | HR 84 | Temp 98.5°F | Wt 218.0 lb

## 2010-10-17 DIAGNOSIS — R454 Irritability and anger: Secondary | ICD-10-CM

## 2010-10-17 NOTE — Progress Notes (Signed)
Had been working long hours.  More irritable last night.  Inc in stress at today, there was a series of problems related to trouble shooting a task at work.  He got more frustrated.  "I'm not normally like this."  "I had to walk off, I was so frustrated."  His sugar was 105 during an episode.  Neither one of [place] now think it's sugar related.  This was similar to prev episodes.    No SI/HI.  We talked about possible dx, likely anxiety/irritability.  He is an admitted perfectionist.  His goal is to prevent these episodes from happening.    Meds, vitals, and allergies reviewed.   ROS: See HPI.  Otherwise, noncontributory.  GEN: nad, alert and oriented HEENT: mucous membranes moist NECK: supple w/o LA, no tmg CV: rrr.  no murmur PULM: ctab, no inc wob EXT: no edema SKIN: no acute rash Affect wnl, judgement intact.

## 2010-10-17 NOTE — Patient Instructions (Signed)
I'll check your thyroid labs and then send notice.  Take care.  I would look into the EAP at work.  Glad to see you today.

## 2010-10-18 ENCOUNTER — Encounter: Payer: Self-pay | Admitting: Family Medicine

## 2010-10-18 ENCOUNTER — Telehealth: Payer: Self-pay | Admitting: Family Medicine

## 2010-10-18 LAB — TSH: TSH: 0.44 u[IU]/mL (ref 0.35–5.50)

## 2010-10-18 MED ORDER — FLUOXETINE HCL 20 MG PO TABS: 20.0000 mg | ORAL_TABLET | Freq: Every day | ORAL | Status: DC

## 2010-10-18 NOTE — Telephone Encounter (Signed)
.  left message to have patient return my call.  

## 2010-10-18 NOTE — Assessment & Plan Note (Signed)
Check TSH, address if abnormal.  If wnl, likely dx is anxiety/fatigue related.  He'll check with EAP, consider inc in SSRI and work to decrease sleep disruptions, work stress. No SI/HI. >25 min spent with face to face with patient, >50% counseling.

## 2010-10-18 NOTE — Telephone Encounter (Signed)
Please call pt.  TSH wnl.  I think this is likely anxiety, not a thyroid problem.  I would increase the fluoxetine to 20mg  a day.  He can take 2 of the 10mg  tabs at the same time of day to finish up what he has.  If he needs as new rx, please call in 20mg  tabs, 1 a day, #90 with 1rf.  I would have him look into the EAP at work like we talked about.  Thanks.

## 2010-10-19 MED ORDER — FLUOXETINE HCL 20 MG PO TABS: 20.0000 mg | ORAL_TABLET | Freq: Every day | ORAL | Status: DC

## 2010-10-19 NOTE — Telephone Encounter (Signed)
Spoke with patient and advised results rx sent to pharmacy by e-script  

## 2010-12-20 LAB — DIFFERENTIAL
Basophils Relative: 1
Eosinophils Relative: 6 — ABNORMAL HIGH
Monocytes Relative: 7
Neutrophils Relative %: 53

## 2010-12-20 LAB — CBC
HCT: 42.9
Hemoglobin: 14.8
MCHC: 34.4
MCV: 85
Platelets: 244
RBC: 5.05
RDW: 13.2
WBC: 5.5

## 2010-12-20 LAB — URINALYSIS, ROUTINE W REFLEX MICROSCOPIC
Bilirubin Urine: NEGATIVE
Glucose, UA: NEGATIVE
Hgb urine dipstick: NEGATIVE
Ketones, ur: NEGATIVE
Nitrite: NEGATIVE
Protein, ur: NEGATIVE
Specific Gravity, Urine: 1.009
Urobilinogen, UA: 0.2
pH: 6.5

## 2010-12-20 LAB — POCT CARDIAC MARKERS
CKMB, poc: <1 — ABNORMAL LOW
Myoglobin, poc: 63.6
Operator id: 285491
Troponin i, poc: <0.05

## 2010-12-20 LAB — POCT I-STAT, CHEM 8
Calcium, Ion: 1.07 — ABNORMAL LOW
Chloride: 107
Glucose, Bld: 95
HCT: 45
Hemoglobin: 15.3
TCO2: 26

## 2010-12-20 LAB — POTASSIUM: Potassium: 4

## 2011-01-08 ENCOUNTER — Other Ambulatory Visit: Payer: Self-pay | Admitting: Family Medicine

## 2011-01-08 NOTE — Telephone Encounter (Signed)
Sent!

## 2011-01-08 NOTE — Telephone Encounter (Signed)
will route to PCP and [place].

## 2011-01-18 ENCOUNTER — Encounter: Payer: Self-pay | Admitting: Family Medicine

## 2011-01-18 ENCOUNTER — Ambulatory Visit (INDEPENDENT_AMBULATORY_CARE_PROVIDER_SITE_OTHER): Payer: 59 | Admitting: Family Medicine

## 2011-01-18 VITALS — BP 140/90 | HR 60 | Temp 98.0°F | Wt 226.8 lb

## 2011-01-18 DIAGNOSIS — R030 Elevated blood-pressure reading, without diagnosis of hypertension: Secondary | ICD-10-CM

## 2011-01-18 DIAGNOSIS — M609 Myositis, unspecified: Secondary | ICD-10-CM

## 2011-01-18 DIAGNOSIS — I10 Essential (primary) hypertension: Secondary | ICD-10-CM | POA: Insufficient documentation

## 2011-01-18 DIAGNOSIS — IMO0001 Reserved for inherently not codable concepts without codable children: Secondary | ICD-10-CM

## 2011-01-18 NOTE — Progress Notes (Signed)
  Subjective:    Patient ID: Dean Bishop, male    DOB: [birth year], 45 y.o.   MRN: [id number]  HPI Former pt of mine now Dr Lianne Bushy who is here for acute appt for since yesterday morning has arthritic discomfort of arms and ache of the arms with no change in activity or intake except he ran out of chol medication last week. He has not started it back again yet. Taking 3 IBP in the AM resolves the problem, 3 more mid afternoon resolves again but then has sxs in the middle of the night. Night before he had sxs at 2AM, last night 3AM. He has not done no activity with arms other than typing. He denies bites, ticks etc. No rash, no swelling. No shots lately.  He quit drinking diet Mountain Dew 3 a day, 16oz two weeks ago but he says they were decaff diet mountain dew.   Review of Systems  Constitutional: Negative for fever, chills, diaphoresis, appetite change, fatigue and unexpected weight change.  HENT: Negative for hearing loss, ear pain, tinnitus and ear discharge.   Eyes: Negative for pain, discharge and visual disturbance.  Respiratory: Negative for cough, shortness of breath and wheezing.   Cardiovascular: Negative for chest pain and palpitations.       No SOB w/ exertion  Gastrointestinal: Negative for nausea, vomiting, abdominal pain, diarrhea, constipation and blood in stool.       No heartburn or swallowing problems.  Genitourinary: Negative for dysuria, frequency and difficulty urinating.       No nocturia  Musculoskeletal: Positive for myalgias (throughout both arms, right > left.). Negative for back pain and arthralgias.  Skin: Negative for rash.       No itching or dryness.  Neurological: Negative for tremors and numbness.       No tingling or balance problems.  Hematological: Negative for adenopathy. Does not bruise/bleed easily.  Psychiatric/Behavioral: Negative for dysphoric mood and agitation.       Objective:   Physical Exam  Constitutional: He is oriented to person,  place, and time. He appears well-developed and well-nourished. No distress.  HENT:  Head: Normocephalic and atraumatic.  Right Ear: External ear normal.  Left Ear: External ear normal.  Nose: Nose normal.  Mouth/Throat: Oropharynx is clear and moist.  Eyes: Conjunctivae and EOM are normal. Pupils are equal, round, and reactive to light. Right eye exhibits no discharge. Left eye exhibits no discharge.  Neck: Normal range of motion. Neck supple.  Cardiovascular: Normal rate and regular rhythm.   Pulmonary/Chest: Effort normal and breath sounds normal. He has no wheezes.  Musculoskeletal: Normal range of motion. He exhibits no edema and no tenderness.       Strength nml but testing triggered his sxs of arms bilat mildly.  Lymphadenopathy:    He has no cervical adenopathy.  Neurological: He is alert and oriented to person, place, and time. He has normal reflexes. He displays normal reflexes. No cranial nerve deficit. He exhibits normal muscle tone. Coordination normal.  Skin: He is not diaphoretic.          Assessment & Plan:

## 2011-01-18 NOTE — Progress Notes (Signed)
Addended by: Alvina Chou on: 01/18/2011 04:12 PM   Modules accepted: Orders

## 2011-01-18 NOTE — Patient Instructions (Addendum)
RTC one week for recheck.Take IBP 200mg  3 tabs after brfst lunch and supper. Labs today. Ice any area of acute focal irritation when they occur. Recheck BP next time.

## 2011-01-18 NOTE — Progress Notes (Signed)
Addended by: Alvina Chou on: 01/18/2011 03:11 PM   Modules accepted: Orders

## 2011-01-18 NOTE — Assessment & Plan Note (Signed)
Will recheck BP next week and start treating if remains elevated. BP Readings from Last 3 Encounters:  01/18/11 140/90  10/17/10 122/88  06/15/10 118/70

## 2011-01-18 NOTE — Assessment & Plan Note (Signed)
Cannot think of acute problem. Assume some type of unusual viral disease and refer to Neuro if no improvement with regular NSAIDs. Also told to ice focal areas if they occur.

## 2011-01-19 LAB — HEPATIC FUNCTION PANEL
Albumin: 4.3 g/dL (ref 3.5–5.2)
Bilirubin, Direct: 0 mg/dL (ref 0.0–0.3)
Total Protein: 7.1 g/dL (ref 6.0–8.3)

## 2011-01-19 LAB — CBC WITH DIFFERENTIAL/PLATELET
Basophils Relative: 0.3 % (ref 0.0–3.0)
Eosinophils Absolute: 0.3 10*3/uL (ref 0.0–0.7)
MCHC: 33.9 g/dL (ref 30.0–36.0)
MCV: 88.6 fl (ref 78.0–100.0)
Monocytes Absolute: 0.3 10*3/uL (ref 0.1–1.0)
Neutrophils Relative %: 55.9 % (ref 43.0–77.0)
RBC: 4.79 Mil/uL (ref 4.22–5.81)
RDW: 13.4 % (ref 11.5–14.6)

## 2011-01-19 LAB — RENAL FUNCTION PANEL
Albumin: 4.3 g/dL (ref 3.5–5.2)
BUN: 22 mg/dL (ref 6–23)
Glucose, Bld: 83 mg/dL (ref 70–99)
Phosphorus: 3.6 mg/dL (ref 2.3–4.6)
Potassium: 4.6 mEq/L (ref 3.5–5.1)

## 2011-01-21 ENCOUNTER — Other Ambulatory Visit: Payer: Self-pay | Admitting: Family Medicine

## 2011-01-22 NOTE — Telephone Encounter (Signed)
Received refill request electronically from pharmacy. See recent lab results. Is it okay to refill medication?

## 2011-01-22 NOTE — Telephone Encounter (Signed)
He can start back on the medicine if the aches have resolved.  If they haven't, then don't start it and notify us.  Thanks.

## 2011-01-23 MED ORDER — SIMVASTATIN 40 MG PO TABS: 20.0000 mg | ORAL_TABLET | Freq: Every day | ORAL | Status: DC

## 2011-01-23 NOTE — Telephone Encounter (Signed)
Spoke with wife and advised.

## 2011-01-23 NOTE — Telephone Encounter (Signed)
Patient does not have an upcoming appt regarding his labs.  Also, there seems to be some conflict in his dosage of Simvastatin.  The wife says that the Rx bottle says to take one 40 mg tablet a day.  She says his last print-out from his OV says to take 1/2 tablet (40 mg) a day.  Can you help to clear this up?

## 2011-01-23 NOTE — Telephone Encounter (Signed)
Thank you for the correction. It should be 1/2 tab a day as previously taken. Please have him take 1/2 tab a day, to equal 20mg  a day. I have updated the EMR.  Please find out if the aches have resolved.  He can start back on the simvastatin if the aches have resolved. If they haven't, then don't start it and let me know.

## 2011-01-25 ENCOUNTER — Ambulatory Visit (INDEPENDENT_AMBULATORY_CARE_PROVIDER_SITE_OTHER): Payer: 59 | Admitting: Family Medicine

## 2011-01-25 ENCOUNTER — Encounter: Payer: Self-pay | Admitting: Family Medicine

## 2011-01-25 VITALS — BP 118/82 | HR 74 | Temp 98.0°F | Wt 228.1 lb

## 2011-01-25 DIAGNOSIS — M791 Myalgia, unspecified site: Secondary | ICD-10-CM

## 2011-01-25 DIAGNOSIS — M609 Myositis, unspecified: Secondary | ICD-10-CM

## 2011-01-25 DIAGNOSIS — IMO0001 Reserved for inherently not codable concepts without codable children: Secondary | ICD-10-CM

## 2011-01-25 LAB — HEPATIC FUNCTION PANEL
ALT: 55 U/L — ABNORMAL HIGH (ref 0–53)
AST: 36 U/L (ref 0–37)
Albumin: 4.4 g/dL (ref 3.5–5.2)
Alkaline Phosphatase: 53 U/L (ref 39–117)
Bilirubin, Direct: 0 mg/dL (ref 0.0–0.3)
Total Protein: 7.2 g/dL (ref 6.0–8.3)

## 2011-01-25 NOTE — Patient Instructions (Signed)
Don't change your meds for now.  See Shirlee Limerick about your referral before your leave today. You can get your results through our phone system.  Follow the instructions on the blue card. Take care.

## 2011-01-25 NOTE — Progress Notes (Signed)
Myalgias, aches in arms.  Nonstop pain in arms. Pain and dec in ROM in R shoulder.  Burning/shock feeling in L arm, worse in AM.  Wakes up at night with pain.  Saw Dr. Hetty Ely last week with this, sx had started recently before that visit.  No trigger known.  Off cholesterol medicine, off it before the pain started.  Was drinking decaf soda, but cut back before this started. No other changes in meds.  No tick bites recently.  No rash.  No fevers.  No pain in legs now, prev with some B quad discomfort that self resolved.  Ibuprofen helps some with the pain but does nothing for the shocks.  No back pain.   Meds, vitals, and allergies reviewed.   ROS: See HPI.  Otherwise, noncontributory.  nad ncat Mmm rrr ctab  He can use his L arm to assist/complete PROM at the R shoulder, but he can't fully extend the arm R superiorly o/w.  No cuff impingement on testing.

## 2011-01-26 ENCOUNTER — Ambulatory Visit (INDEPENDENT_AMBULATORY_CARE_PROVIDER_SITE_OTHER): Payer: 59 | Admitting: Neurology

## 2011-01-26 ENCOUNTER — Encounter: Payer: Self-pay | Admitting: Neurology

## 2011-01-26 DIAGNOSIS — G540 Brachial plexus disorders: Secondary | ICD-10-CM

## 2011-01-26 DIAGNOSIS — S143XXA Injury of brachial plexus, initial encounter: Secondary | ICD-10-CM

## 2011-01-26 MED ORDER — TRAMADOL HCL 50 MG PO TABS: 50.0000 mg | ORAL_TABLET | Freq: Four times a day (QID) | ORAL | Status: DC | PRN

## 2011-01-26 NOTE — Progress Notes (Signed)
Dear Dr. Para March,  Thank you for having me see Dean Bishop in consultation today at [facility] for his problem with bilateral arm tingling and shoulder pain.  As you may recall, he is a 45 y.o. year old male with a history of hyperlipidemia who presents with sudden onset of bilateral pain worse in the right shoulder but also involving the left shoulder.  He then developed weakness of the right shoulder muscles, having difficulty lifting his arm above his head on the right.  The symptoms started about 12 days ago.  He has not had any weakness of his left arm, but does have some mild tingling on that side.  He has had no trauma or injury to the arm preceding this.  He may have had a mild fever just preceding this.    Past Medical History  Diagnosis Date  . Asthma     Dr. Barnetta Chapel  . Anxiety   . Depression   . Internal thrombosed hemorrhoids   . Hyperlipidemia   . Irritability   . Infective otitis externa, unspecified   . Acute sinusitis, unspecified   . Shortness of breath   . Special screening for malignant neoplasm of prostate   . 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  History   Social History  . Marital Status: Married    Spouse Name: N/A    Number of Children: 1  . Years of Education: N/A   Occupational History  . Acupuncturist     [employer]   Social History Main Topics  . Smoking status: Former Smoker    Types: Cigarettes    Quit date: 01/25/2001  . Smokeless tobacco: Never Used   Comment: quit over 10 years ago  . Alcohol Use: 0.0 oz/week     Minimal  . Drug Use: No  . Sexually Active: None   Other Topics Concern  . None   Social History Narrative   Acupuncturist, [employer] grad, Married 1993.     Family History  Problem Relation Age of Onset  . Alcohol abuse Father   . Diabetes Father   . Heart disease Father   . Heart disease Paternal Grandfather     CABG x 2  . Cancer Neg Hx     . Stroke Neg Hx     No history of neurologic disease.  ROS:  13 systems were reviewed and are notable for recent weight gain.  No new breathing problems.  All other review of systems are unremarkable.   Examination:  Filed Vitals:   01/26/11 1517  BP: 138/82  Pulse: 72  Height: 6\' 2"  (1.88 m)  Weight: 227 lb (102.967 kg)     In general, well appearing man in NAD.  Cardiovascular: The patient has a regular rate and rhythm and no carotid bruits.  Fundoscopy:  Disks are flat. Vessel caliber within normal limits.  Mental status:   The patient is oriented to person, place and time. Recent and remote memory are intact. Attention span and concentration are normal. Language including repetition, naming, following commands are intact. Fund of knowledge of current and historical events, as well as vocabulary are normal.  Cranial Nerves: Possible right horner's syndrome with right sided anisocoria and ptosis.  Visual fields full to confrontation. Extraocular movements are intact without nystagmus. Facial sensation and muscles of mastication are intact. Muscles of facial expression are symmetric. Hearing intact to bilateral  finger rub. Tongue protrusion, uvula, palate midline.  Shoulder shrug intact  Motor:  Has decreased bulk in his right shoulder.  3/5 weakness of shoulder abduction above head on right, + scapular winging, shoulder internal and external rotation 4/5 on the right.  Other muscles 5/5.      Reflexes:   Biceps  Triceps Brachioradialis Knee Ankle  Right 2+  2+  2+   2+ 2+  Left  2+  2+  2+   2+ 2+  Toes down  Coordination:  Normal finger to nose.  No dysdiadokinesia.  Sensation is intact to temperature and vibration.  Gait and Station are normal.  Tandem gait is intact.  Romberg is negative   Impression/Recs: I am concerned the patient has a brachial plexopathy on the right.  I cannot explain the left sided symptoms, but certainly all his concrete findings are on  the right.  I want to first rule out a compressive lesion, so I am going to get a MRI with and without contrast of his right brachial plexus as well as his C-spine. The fact he has a right sided Horner's syndrome makes me more worried about a compressive lesion. If this is negative then I would assume this is an idiopathic brachial plexopathy(Parsonage-Turner syndrome).  In that case I would suggest treatment with high dose steroids(although the efficacy of this is controversial).  I would will likely arrange an EMG/NCS after the results of the MRI.    We will see the patient back in 2 weeks.  Thank you for having [place] see Dean Bishop in consultation.  Feel free to contact me with any questions.  Lupita Raider Modesto Charon, MD [facility], [place] [address] [place], [place] [address] Phone: [phone] Fax: [phone].

## 2011-01-26 NOTE — Patient Instructions (Signed)
Your MRI is scheduled for Sunday, November 4th at 9:00am.  Please arrive to [facility] by 8:45am.

## 2011-01-27 NOTE — Assessment & Plan Note (Signed)
Unclear source, but I don't think this is ortho in origin.  Recheck lfts, check CK and refer to neuro. D/w pt and he agrees.

## 2011-01-28 ENCOUNTER — Ambulatory Visit (HOSPITAL_COMMUNITY)
Admission: RE | Admit: 2011-01-28 | Discharge: 2011-01-28 | Disposition: A | Discharge status: Home / Self Care | Payer: 59 | Source: Ambulatory Visit | Attending: Neurology | Admitting: Neurology

## 2011-01-28 ENCOUNTER — Other Ambulatory Visit: Payer: Self-pay | Admitting: Neurology

## 2011-01-28 DIAGNOSIS — R209 Unspecified disturbances of skin sensation: Secondary | ICD-10-CM | POA: Insufficient documentation

## 2011-01-28 DIAGNOSIS — M898X9 Other specified disorders of bone, unspecified site: Secondary | ICD-10-CM | POA: Insufficient documentation

## 2011-01-28 DIAGNOSIS — R29898 Other symptoms and signs involving the musculoskeletal system: Secondary | ICD-10-CM | POA: Insufficient documentation

## 2011-01-28 DIAGNOSIS — G540 Brachial plexus disorders: Secondary | ICD-10-CM

## 2011-01-28 DIAGNOSIS — M4802 Spinal stenosis, cervical region: Secondary | ICD-10-CM | POA: Insufficient documentation

## 2011-01-28 DIAGNOSIS — L723 Sebaceous cyst: Secondary | ICD-10-CM | POA: Insufficient documentation

## 2011-01-28 DIAGNOSIS — M4180 Other forms of scoliosis, site unspecified: Secondary | ICD-10-CM | POA: Insufficient documentation

## 2011-01-28 DIAGNOSIS — M5020 Other cervical disc displacement, unspecified cervical region: Secondary | ICD-10-CM | POA: Insufficient documentation

## 2011-01-28 MED ORDER — GADOBENATE DIMEGLUMINE 529 MG/ML IV SOLN
20.0000 mL | Freq: Once | INTRAVENOUS | Status: AC | PRN
  Administered 2011-01-28: 20 mL via INTRAVENOUS

## 2011-01-29 ENCOUNTER — Other Ambulatory Visit: Payer: Self-pay

## 2011-01-29 ENCOUNTER — Telehealth: Payer: Self-pay | Admitting: Neurology

## 2011-01-29 ENCOUNTER — Telehealth: Payer: Self-pay

## 2011-01-29 DIAGNOSIS — G540 Brachial plexus disorders: Secondary | ICD-10-CM

## 2011-01-29 NOTE — Telephone Encounter (Signed)
Message copied by Lelon Huh on Mon Jan 29, 2011 10:41 AM ------      Message from: Denton Meek H      Created: Mon Jan 29, 2011  9:34 AM       HI Christasia Angeletti.  Could you call Leanne Willis's office for an EMG and see if they can do it today?  Otherwise next Monday would work as well.  If you could call mr. Loncar that would be be great.

## 2011-01-29 NOTE — Telephone Encounter (Signed)
Message copied by Robbi Garter on Mon Jan 29, 2011  9:51 AM ------      Message from: Gall[place], [place] M      Created: Mon Jan 29, 2011  9:38 AM      Regarding: RE: MRI Chest       Yeah I did know that part, I am assuming the radiologist must of ordered that when he was there for the other MRI's so I don't know of how we could have done that any different since it was a Sunday            ----- Message -----         From: Robbi Garter         Sent: 01/29/2011   9:23 AM           To: Tiffany Jannet Mantis, CMA      Subject: MRI Chest                                                Apparently a MRI Chest was added yesterday at time of scan. [employer] denied because it should have been authorized prior the exam. Just wanted to let you know that if additional scans are ordered during the original procedure, they cannot be retroauthorized.

## 2011-01-29 NOTE — Telephone Encounter (Signed)
Pt informed of appt for ncs/emg on 02/05/11 at 11:30am

## 2011-02-04 ENCOUNTER — Inpatient Hospital Stay (HOSPITAL_COMMUNITY): Admission: RE | Admit: 2011-02-04 | Payer: 59 | Source: Ambulatory Visit

## 2011-02-05 ENCOUNTER — Other Ambulatory Visit: Payer: Self-pay

## 2011-02-05 ENCOUNTER — Telehealth: Payer: Self-pay | Admitting: Neurology

## 2011-02-05 MED ORDER — HYDROCODONE-ACETAMINOPHEN 5-325 MG PO TABS: 1.0000 | ORAL_TABLET | Freq: Four times a day (QID) | ORAL | Status: AC | PRN

## 2011-02-05 NOTE — Telephone Encounter (Signed)
Pt has no more refills left on his pain meds, but he states they aren't working anyway. He is going out of town to [place] tomorrow morning and needs a different medication before then.

## 2011-02-05 NOTE — Telephone Encounter (Signed)
Sounds like EMG revealed Parsonage Turner.  I have called in a script for Vicodin 5/325 for the patient for his pain.

## 2011-02-07 ENCOUNTER — Encounter: Payer: Self-pay | Admitting: Neurology

## 2011-02-13 ENCOUNTER — Telehealth: Payer: Self-pay | Admitting: Neurology

## 2011-02-13 NOTE — Telephone Encounter (Signed)
Pt had a "major setback" last night and isn't sure if he needs to come in for an appointment. He wants you to call him before he makes any decisions.

## 2011-02-13 NOTE — Telephone Encounter (Signed)
could you book Dean Bishop in tomorrow at 9:00 a.m. thx.  he knows as I spoke to him.

## 2011-02-14 ENCOUNTER — Other Ambulatory Visit (INDEPENDENT_AMBULATORY_CARE_PROVIDER_SITE_OTHER): Payer: 59

## 2011-02-14 ENCOUNTER — Encounter: Payer: Self-pay | Admitting: Neurology

## 2011-02-14 ENCOUNTER — Ambulatory Visit (INDEPENDENT_AMBULATORY_CARE_PROVIDER_SITE_OTHER): Payer: 59 | Admitting: Neurology

## 2011-02-14 VITALS — BP 118/84 | HR 70 | Wt 226.5 lb

## 2011-02-14 DIAGNOSIS — R200 Anesthesia of skin: Secondary | ICD-10-CM

## 2011-02-14 DIAGNOSIS — R209 Unspecified disturbances of skin sensation: Secondary | ICD-10-CM

## 2011-02-14 LAB — SEDIMENTATION RATE: Sed Rate: 5 mm/hr (ref 0–22)

## 2011-02-14 MED ORDER — PREDNISONE 10 MG PO TABS: ORAL_TABLET | ORAL | Status: DC

## 2011-02-14 NOTE — Patient Instructions (Addendum)
Go to the basement to have your labs drawn today.  The prescription for the Prednisone was sent to your pharmacy.   prednisone 10mg  tabs  take 6 tabs daily for 2 weeks, then take 5 tabs daily for 2 weeks, then take 4 tabs daily for 1 week, then take 3 tabs daily for 1 week, then take 2 tabs daily for 1 week, then take 1 tab daily for 1 week, then stop.

## 2011-02-14 NOTE — Progress Notes (Signed)
Dear Dr. Para March,  I saw  [name] back in [facility] for his problem with idiopathic brachial neuropathy.  As you may recall, he is a 45 y.o. year old male with a benign medical history who presented to me on 11/2 with a 12 day history of weakness in the right arm and pain and tingling in bilateral arms.  He had significant weakness of the right shoulder musculature, particularly in the rhomboids as well as having scapular winging.  I felt he likely had a brachial plexopathy and an urgent MRI of his chest and C-spine did not reveal any obvious enhancement, but did reveal a paralabral cyst which I felt was unlikely to be causing his symptoms.  He had an EMG that confirmed a brachial plexopathy on the right, with involvement of the dorsal scapular nerve and long thoracic nerve.  It was felt to be primarily demyelinating.  The patient indicates that the pain comes and goes and he does not feel he has gotten any weaker, although the numbness has gotten worse in his right arm.  He did have a strange spell of worsening weakness lasting about 30 minutes in his whole right arm, with bilateral arm tingling, and worsening pain.  He says he may have had some problems speaking.  Interestingly, he felt he had problems with memory and word finding earlier in the day.  He has not had any further spells.    Notably, patient has had several tick bites over the last several months.   Medical history, social history, and family history were reviewed and have not changed since the last clinic visit.  Current Outpatient Prescriptions on File Prior to Visit  Medication Sig Dispense Refill  . ADVAIR DISKUS 100-50 MCG/DOSE AEPB TAKE 1 PUFF BY MOUTH TWICE A DAY  1 each  4  . azelastine (OPTIVAR) 0.05 % ophthalmic solution Place 1 drop into both eyes as needed.        Marland Kitchen FLUoxetine (PROZAC) 20 MG tablet Take 1 tablet (20 mg total) by mouth daily.  90 tablet  1  . HYDROcodone-acetaminophen (NORCO) 5-325 MG  per tablet Take 1 tablet by mouth every 6 (six) hours as needed for pain.  60 tablet  1  . PROAIR HFA 108 (90 BASE) MCG/ACT inhaler INHALE 2 PUFFS EVERY 6HRS AS NEEDED FOR COUGH AND WHEEZING  8.5 g  5  . traMADol (ULTRAM) 50 MG tablet Take 1 tablet (50 mg total) by mouth every 6 (six) hours as needed for pain. Maximum dose= 8 tablets per day  30 tablet  1    No Known Allergies  ROS:  13 systems were reviewed and are notable for no fevers or no chills, but he was diaphoretic during one of the spells of worsening arm weakness.  All other review of systems are unremarkable.  Exam: . Filed Vitals:   02/14/11 0849  BP: 118/84  Pulse: 70  Weight: 226 lb 8 oz (102.74 kg)    In general, well appearing man in NAD.  Mental status:   The patient is oriented to person, place and time. Recent and remote memory are intact. Attention span and concentration are normal. Language including repetition, naming, following commands are intact. Fund of knowledge of current and historical events, as well as vocabulary are normal.  Cranial Nerves: Pupils are equally round and reactive to light. Visual fields full to confrontation. Extraocular movements are intact without nystagmus. Facial sensation and muscles of mastication are intact. Muscles of  facial expression are symmetric but he has a right sided ptosis that I believe is secondary to a Horner's despite his lack of pupillary abnormalities. Hearing intact to bilateral finger rub. Tongue protrusion, uvula, palate midline.  Shoulder shrug intact  Motor:  Examination of his upper extremity revealed. Right scapular winging, severe weakness of the rhomboids, with intact trapezius, seratus anterior, pec major and pec minor, supraspinatus and infraspinatus, lat dorsi but ?weakness right teres major.  Intact left upper extremity.  Reflexes:  2+ thoughout  Gait:  Normal gait and station.  Romberg negative.  Impression/Recs:  Likely idiopathic right brachial  plexopathy with right long thoracic nerve and dorsal scapular nerve affected.  I am concerned about this spell of worsening.  I am not convinced it is a different process than his plexopathy, although I guess it is possible that it could be centrally mediated.  While I recommended an MRI brain and MRA head and neck, he declined for now, which I think is ok.  I would like to start him on steroids 60mg  daily and then a slow taper as this may help in idiopathic brachial plexopathy.  In addition, I would like to get Lyme serology as there are case reports of this being associated with IBP.  I will get an ESR and CRP at the same time.   We will see the patient back in 6 weeks.  Lupita Raider Modesto Charon, MD [facility], [place]

## 2011-03-04 NOTE — Progress Notes (Signed)
Lyme Screen -, sent for scaning.

## 2011-03-07 ENCOUNTER — Encounter: Payer: Self-pay | Admitting: Neurology

## 2011-04-03 ENCOUNTER — Ambulatory Visit (INDEPENDENT_AMBULATORY_CARE_PROVIDER_SITE_OTHER): Payer: 59 | Admitting: Neurology

## 2011-04-03 ENCOUNTER — Encounter: Payer: Self-pay | Admitting: Neurology

## 2011-04-03 VITALS — BP 118/76 | HR 68 | Wt 232.0 lb

## 2011-04-03 DIAGNOSIS — G545 Neuralgic amyotrophy: Secondary | ICD-10-CM | POA: Insufficient documentation

## 2011-04-03 NOTE — Progress Notes (Signed)
Dear Dr. Para March,   I saw Dean Bishop back in [facility] for his problem with idiopathic brachial neuropathy. As you may recall, he is a 46 y.o. year old male with a benign medical history who presented to me on 11/2 with a 12 day history of weakness in the right arm and pain and tingling in bilateral arms. He had significant weakness of the right shoulder musculature, particularly in the rhomboids as well as having scapular winging. I felt he likely had a brachial plexopathy and an urgent MRI of his chest and C-spine did not reveal any obvious enhancement, but did reveal a paralabral cyst which I felt was unlikely to be causing his symptoms. He had an EMG that confirmed a brachial plexopathy on the right, with involvement of the dorsal scapular nerve and long thoracic nerve. It was felt to be primarily demyelinating.  At his last clinic visit I started him on steroids, because of a spell of worsening weakness and bilateral arm tingling.  While I though this was unusual I did not want to risk interval worsening of his condition.  Since that time he has been on a steroid taper and has almost completed it.  He has had improvement of his symptoms with no burning pain.  He still has numbness in his bilateral arms.  He feels the same amount of weakness.  He has had 12 lbs of weight gain due to the steroids.     Medical history, social history, and family history were reviewed and have not changed since the last clinic visit.  Current Outpatient Prescriptions on File Prior to Visit  Medication Sig Dispense Refill  . ADVAIR DISKUS 100-50 MCG/DOSE AEPB TAKE 1 PUFF BY MOUTH TWICE A DAY  1 each  4  . azelastine (OPTIVAR) 0.05 % ophthalmic solution Place 1 drop into both eyes as needed.        Marland Kitchen FLUoxetine (PROZAC) 20 MG tablet Take 1 tablet (20 mg total) by mouth daily.  90 tablet  1  . predniSONE (DELTASONE) 10 MG tablet start with 60mg  daily, reduce as directed.  180 tablet  3  . PROAIR HFA  108 (90 BASE) MCG/ACT inhaler INHALE 2 PUFFS EVERY 6HRS AS NEEDED FOR COUGH AND WHEEZING  8.5 g  5  . traMADol (ULTRAM) 50 MG tablet Take 1 tablet (50 mg total) by mouth every 6 (six) hours as needed for pain. Maximum dose= 8 tablets per day  30 tablet  1    No Known Allergies  ROS:  13 systems were reviewed and are notable for weight gain as above..  All other review of systems are unremarkable.  Exam: . Filed Vitals:   04/03/11 1104  BP: 118/76  Pulse: 68  Weight: 232 lb (105.235 kg)    In general, well appearing man.    Cranial Nerves: Mildly anisocoric, right pupil smaller than left, both reactive. Right ptosis.   Extraocular movements are intact without nystagmus.Muscles of facial expression are symmetric. Shoulder shrug intact  Motor:  Still significant weakness of rhomboids on right with scapular winging.  No other strength abnormalities.  Reflexes:  2+ thoughout.  Gait:  Normal gait and station.   Impression/Recs:  Idiopathic brachial plexopathy(Parsonage-Turner syndrome), stable.  He can continue to wean off the steroids.  It may take many months for an improvement(some do not improve at all but I am hopeful of improvement).  If he has worsening symptoms he will call me, but hopefully we don't have  to restart the steroids.   We will see the patient back in 6 months.  He can of course return earlier if he has any worsening symptoms.  Dean Raider Modesto Charon, MD [facility], [place]

## 2011-04-03 NOTE — Patient Instructions (Signed)
We will notify you for your 6 month follow up.

## 2011-05-17 ENCOUNTER — Telehealth: Payer: Self-pay

## 2011-05-17 NOTE — Telephone Encounter (Signed)
Pt calling saying for the past couple of weeks has been having pain in the center of his spine just below his neck and to the left side.  It aches and hurts to cough and sneeze.  He is not sure if it is just strained or if he should come back to see you, his regular doctor or just take some more ibuprofen.  He has been taking ibuprofen over the past 2 weeks and it is not helping really now, he then started taking the pain med you gave and it is not helping much now.  It is getting worse in intensity.

## 2011-05-18 NOTE — Telephone Encounter (Signed)
I left a message for the patient at his home and mobile numbers letting him know that we have an appointment for him to see Dr. Modesto Charon on 2/25 at 3:30 pm.

## 2011-05-18 NOTE — Telephone Encounter (Signed)
Probably makes sense to bring him in to see me.

## 2011-05-21 ENCOUNTER — Encounter: Payer: Self-pay | Admitting: Neurology

## 2011-05-21 ENCOUNTER — Ambulatory Visit (INDEPENDENT_AMBULATORY_CARE_PROVIDER_SITE_OTHER): Payer: 59 | Admitting: Neurology

## 2011-05-21 VITALS — BP 122/80 | HR 68 | Ht 74.0 in

## 2011-05-21 DIAGNOSIS — G545 Neuralgic amyotrophy: Secondary | ICD-10-CM

## 2011-05-22 NOTE — Progress Notes (Signed)
Dear Dr. Para March,  I saw  [name] back in [facility] for his problem with burning spine pain and neck pain.  As you may recall, he is a 46 y.o. year old male with a history of right idiopathic brachial plexopathy who began several weeks ago to have worsening neck and burning spine pain.  He had to start taking tramadol to stop the pain.  He feels that the pain is continuous, although his neck pain is worse with neck movement.  He denies weakness, change in bowel or bladder function or leg weakness.  He feels like his right shoulder weakness has not improved.  The pain seems to be worse at night, or at least it wakes him from sleep.   Medical history, social history, and family history were reviewed and have not changed since the last clinic visit.  Current Outpatient Prescriptions on File Prior to Visit  Medication Sig Dispense Refill  . ADVAIR DISKUS 100-50 MCG/DOSE AEPB TAKE 1 PUFF BY MOUTH TWICE A DAY  1 each  4  . azelastine (OPTIVAR) 0.05 % ophthalmic solution Place 1 drop into both eyes as needed.        Marland Kitchen FLUoxetine (PROZAC) 20 MG tablet Take 1 tablet (20 mg total) by mouth daily.  90 tablet  1  . meclizine (ANTIVERT) 12.5 MG tablet Take 12.5 mg by mouth. The patient did not know the strength. Takes only a few times a year prn vertigo.       Marland Kitchen PROAIR HFA 108 (90 BASE) MCG/ACT inhaler INHALE 2 PUFFS EVERY 6HRS AS NEEDED FOR COUGH AND WHEEZING  8.5 g  5  . traMADol (ULTRAM) 50 MG tablet Take 1 tablet (50 mg total) by mouth every 6 (six) hours as needed for pain. Maximum dose= 8 tablets per day  30 tablet  1  . predniSONE (DELTASONE) 10 MG tablet start with 60mg  daily, reduce as directed.  180 tablet  3    No Known Allergies  ROS:  13 systems were reviewed and are notable for continues to have burning pain at times in his bilateral arms.  All other review of systems are unremarkable.  Exam: . Filed Vitals:   05/21/11 1527  BP: 122/80  Pulse: 68  Height: 6\' 2"   (1.88 m)    In general, well appearing man.  Mental status:   The patient is oriented to person, place and time. Recent and remote memory are intact. Attention span and concentration are normal. Language including repetition, naming, following commands are intact. Fund of knowledge of current and historical events, as well as vocabulary are normal.  Cranial Nerves: Pupils are equally round and reactive to light. Visual fields full to confrontation. Extraocular movements are intact without nystagmus. Facial sensation and muscles of mastication are intact. Muscles of facial expression are symmetric, but has right ptosis(Horner's). Hearing intact to bilateral finger rub. Tongue protrusion, uvula, palate midline.  Shoulder shrug intact  Motor:  Normal bulk and tone, no drift and 5/5 muscle strength bilaterally. Except right scapular winging and weak right rhomboids.  Reflexes:  2+ thoughout, toes down.  Coordination:  Normal finger to nose  Sensory testing:  I cannot find a sensory disturbance over his upper back.    Gait:  Normal gait and station.  Romberg negative.  Back:  His spine paraspinal muscles are tender to the touch around T3,  no step deformity or clear spine tenderness  Impression/Recommendations: Neuropathic pain with muscle spasm in upper back.  I am not sure how this relates to his Parsonage-Turner syndrome.  This could represent inflammation of his dorsal rami.  We will just continue to treat it with tramadol for now.  We may need to image this part of the spine if it gets worse.  I have asked him to take tramadol 100mg  at night to see if he can sleep through the night.   Lupita Raider Modesto Charon, MD [facility], [place]

## 2011-07-17 ENCOUNTER — Other Ambulatory Visit: Payer: Self-pay | Admitting: Family Medicine

## 2011-07-17 NOTE — Telephone Encounter (Signed)
Electronic refill request

## 2011-07-18 NOTE — Telephone Encounter (Signed)
Sent!

## 2011-07-29 ENCOUNTER — Encounter (HOSPITAL_COMMUNITY): Payer: Self-pay | Admitting: Emergency Medicine

## 2011-07-29 ENCOUNTER — Emergency Department (HOSPITAL_COMMUNITY)
Admission: EM | Admit: 2011-07-29 | Discharge: 2011-07-29 | Disposition: A | Discharge status: Home / Self Care | Payer: 59 | Source: Home / Self Care | Attending: Emergency Medicine | Admitting: Emergency Medicine

## 2011-07-29 DIAGNOSIS — J209 Acute bronchitis, unspecified: Secondary | ICD-10-CM

## 2011-07-29 MED ORDER — AZITHROMYCIN 250 MG PO TABS: 250.0000 mg | ORAL_TABLET | Freq: Every day | ORAL | Status: AC

## 2011-07-29 NOTE — Discharge Instructions (Signed)
Bronchitis Bronchitis is a problem of the air tubes leading to your lungs. This problem makes it hard for air to get in and out of the lungs. You may cough a lot because your air tubes are narrow. Going without care can cause lasting (chronic) bronchitis. HOME CARE   Drink enough fluids to keep your pee (urine) clear or pale yellow.   Use a cool mist humidifier.   Quit smoking if you smoke. If you keep smoking, the bronchitis might not get better.   Only take medicine as told by your doctor.  GET HELP RIGHT AWAY IF:   Coughing keeps you awake.   You start to wheeze.   You become more sick or weak.   You have a hard time breathing or get short of breath.   You cough up blood.   Coughing lasts more than 2 weeks.   You have a fever.   Your baby is older than 3 months with a rectal temperature of 102 F (38.9 C) or higher.   Your baby is 3 months old or younger with a rectal temperature of 100.4 F (38 C) or higher.  MAKE SURE YOU:  Understand these instructions.   Will watch your condition.   Will get help right away if you are not doing well or get worse.  Document Released: 08/29/2007 Document Revised: 03/01/2011 Document Reviewed: 02/11/2009 Exit[employer] Patient Information 2012 [employer]. 

## 2011-07-29 NOTE — ED Notes (Signed)
Uri symptoms for 2 weeks, evaluated by physician prior to this nurse

## 2011-07-29 NOTE — ED Provider Notes (Signed)
History     CSN: [id number]  Arrival date & time 07/29/11  1043   First MD Initiated Contact with Patient 07/29/11 1044      No chief complaint on file.   (Consider location/radiation/quality/duration/timing/severity/associated sxs/prior treatment) HPI Comments: Have been having sinus pressure and congestion for about 2-3 weeks know I feel like the office congestion is more down to my chest. Been coughing up phlegm and have been using more my albuterol. Typically after uses a few times a week and have been using this every day several times a day. He been feeling short of breath with some wheezing. No fevers, still have some upper congestion and sinus congestion postnasal dripping but that has improved "some". "I get this at least once or twice a year with this congestion whether or allergies turn into bronchitis"  Patient is a 46 y.o. male presenting with cough. The history is provided by the patient.  Cough This is a recurrent problem. The current episode started more than 1 week ago. The problem occurs constantly. The problem has not changed since onset.The cough is productive of sputum. There has been no fever. Associated symptoms include headaches, rhinorrhea, shortness of breath and wheezing. Pertinent negatives include no chills, no sweats, no ear congestion, no sore throat and no myalgias. His past medical history does not include bronchitis, pneumonia or bronchiectasis.    Past Medical History  Diagnosis Date  . Asthma     Dr. Barnetta Chapel  . Anxiety   . Depression   . Internal thrombosed hemorrhoids   . Hyperlipidemia   . Irritability   . Infective otitis externa, unspecified   . Acute sinusitis, unspecified   . Shortness of breath   . Special screening for malignant neoplasm of prostate   . 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  Family History  Problem Relation Age of Onset  . Alcohol abuse Father   . Diabetes  Father   . Heart disease Father   . Heart disease Paternal Grandfather     CABG x 2  . Cancer Neg Hx   . Stroke Neg Hx     History  Substance Use Topics  . Smoking status: Former Smoker    Types: Cigarettes    Quit date: 01/25/2001  . Smokeless tobacco: Former Neurosurgeon    Quit date: 03/26/2000   Comment: quit over 10 years ago  . Alcohol Use: 0.0 oz/week     Minimal      Review of Systems  Constitutional: Negative for chills and activity change.  HENT: Positive for rhinorrhea, voice change and sinus pressure. Negative for sore throat, mouth sores and trouble swallowing.   Eyes: Positive for itching.  Respiratory: Positive for cough, shortness of breath and wheezing. Negative for stridor.   Musculoskeletal: Negative for myalgias.  Neurological: Positive for headaches. Negative for weakness and light-headedness.    Allergies  Review of patient's allergies indicates no known allergies.  Home Medications   Current Outpatient Rx  Name Route Sig Dispense Refill  . ADVAIR DISKUS 100-50 MCG/DOSE IN AEPB  TAKE 1 PUFF BY MOUTH TWICE A DAY 1 each 4  . AZELASTINE HCL 0.05 % OP SOLN Both Eyes Place 1 drop into both eyes as needed.      . AZITHROMYCIN 250 MG PO TABS Oral Take 1 tablet (250 mg total) by mouth daily. Take first 2 tablets together, then 1 every day until finished.  6 tablet 0  . FLUOXETINE HCL 20 MG PO CAPS  TAKE 1 CAPSULE BY MOUTH DAILY 90 capsule 1  . IBUPROFEN 200 MG PO TABS Oral Take 200 mg by mouth every 6 (six) hours as needed.    Marland Kitchen MECLIZINE HCL 12.5 MG PO TABS Oral Take 12.5 mg by mouth. The patient did not know the strength. Takes only a few times a year prn vertigo.     Marland Kitchen PREDNISONE 10 MG PO TABS  start with 60mg  daily, reduce as directed. 180 tablet 3  . PROAIR HFA 108 (90 BASE) MCG/ACT IN AERS  INHALE 2 PUFFS EVERY 6HRS AS NEEDED FOR COUGH AND WHEEZING 8.5 g 5    NEEDS REFILLS  . TRAMADOL HCL 50 MG PO TABS Oral Take 1 tablet (50 mg total) by mouth every 6 (six)  hours as needed for pain. Maximum dose= 8 tablets per day 30 tablet 1    BP 141/98  Pulse 82  Temp(Src) 97.5 F (36.4 C) (Oral)  Resp 16  SpO2 96%  Physical Exam  Nursing note and vitals reviewed. Constitutional: He appears well-developed and well-nourished.  HENT:  Head: Normocephalic.  Right Ear: Tympanic membrane normal.  Left Ear: Tympanic membrane normal.  Mouth/Throat: Uvula is midline, oropharynx is clear and moist and mucous membranes are normal.  Eyes: Conjunctivae are normal.  Neck: Neck supple. No JVD present.  Cardiovascular:  No murmur heard. Pulmonary/Chest: Effort normal. No respiratory distress. He has no decreased breath sounds. He has no wheezes. He has no rhonchi. He has no rales. He exhibits no tenderness.  Abdominal: Soft. He exhibits no distension. There is no tenderness.  Lymphadenopathy:    He has no cervical adenopathy.  Neurological: He is alert.  Skin: Skin is warm.    ED Course  Procedures (including critical care time)  Labs Reviewed - No data to display No results found.   1. Bronchitis, acute       MDM   Patient with symptoms of sinusitis and bronchitis ongoing for 2 weeks. Relatively normal lung exam patient agree to hold him further evaluation such as x-rays at this point and treat with antihistamines and course of macrolide antibiotic. Agree with treatment plan and followup care as necessary or no improvement is noted       Jimmie Molly, MD 07/29/11 1433

## 2011-09-17 ENCOUNTER — Encounter: Payer: Self-pay | Admitting: Family Medicine

## 2011-09-17 ENCOUNTER — Ambulatory Visit (INDEPENDENT_AMBULATORY_CARE_PROVIDER_SITE_OTHER): Payer: 59 | Admitting: Family Medicine

## 2011-09-17 VITALS — BP 112/80 | HR 77 | Temp 98.4°F | Wt 230.0 lb

## 2011-09-17 DIAGNOSIS — T148XXA Other injury of unspecified body region, initial encounter: Secondary | ICD-10-CM

## 2011-09-17 DIAGNOSIS — W57XXXA Bitten or stung by nonvenomous insect and other nonvenomous arthropods, initial encounter: Secondary | ICD-10-CM

## 2011-09-17 MED ORDER — MECLIZINE HCL 12.5 MG PO TABS: 12.5000 mg | ORAL_TABLET | Freq: Three times a day (TID) | ORAL | Status: DC | PRN

## 2011-09-17 MED ORDER — DOXYCYCLINE HYCLATE 100 MG PO TABS: 100.0000 mg | ORAL_TABLET | Freq: Two times a day (BID) | ORAL | Status: AC

## 2011-09-17 NOTE — Assessment & Plan Note (Signed)
Nonspecific exam but with recent sx and tick bite I would treat.  He understood. Nontoxic, okay for outpatient fu.

## 2011-09-17 NOTE — Patient Instructions (Addendum)
Start the doxycycline today.  Be careful about getting sunburned on the medicine.  Take care.  This should gradually improve.

## 2011-09-17 NOTE — Progress Notes (Signed)
Last week had some abd pain.  He found an attached tick shortly thereafter.  Then had some back aches and sweats.  "It's like I had the flu."  Fatigued.  Some better today overall.  Tick was partially engorged.  No rash but slightly red at the bite site.    Meds, vitals, and allergies reviewed.   ROS: See HPI.  Otherwise, noncontributory.  nad ncat Mmm rrr ctab abd soft, not ttp Ext w/o edema Healing tick site barely visible in R groin, no sig local reaction.

## 2011-10-15 ENCOUNTER — Other Ambulatory Visit: Payer: Self-pay | Admitting: Family Medicine

## 2011-12-14 ENCOUNTER — Ambulatory Visit (INDEPENDENT_AMBULATORY_CARE_PROVIDER_SITE_OTHER): Payer: 59 | Admitting: Family Medicine

## 2011-12-14 ENCOUNTER — Encounter: Payer: Self-pay | Admitting: Family Medicine

## 2011-12-14 VITALS — BP 130/90 | HR 70 | Temp 97.9°F | Wt 235.0 lb

## 2011-12-14 DIAGNOSIS — R42 Dizziness and giddiness: Secondary | ICD-10-CM

## 2011-12-14 MED ORDER — DIAZEPAM 5 MG PO TABS: 2.5000 mg | ORAL_TABLET | Freq: Three times a day (TID) | ORAL | Status: DC | PRN

## 2011-12-14 NOTE — Progress Notes (Signed)
Taking meclizine for the last week due to vertigo.  This episode is worse than his prev episodes.  With prev episodes, he's have much more improvement with the medicine.   Waking up dizzy.  It takes up to an hour or longer for the medicine to have any effect.  Still feels seasick and nauseated.  No syncope.  Not lightheaded.  Room spins with head turning, and he gets nauseated.  He thinks the rooms is always spinning the same direction.  He noted prev that he does better turning his head when standing up, much worse with head turning when supine.    Wife drove him up here today.    Meds, vitals, and allergies reviewed.   ROS: See HPI.  Otherwise, noncontributory.  nad ncat Tm wnl  Nasal and OP exam wnl PERRL, EOMI No vertigo induced with eye tracking but noted with head turning rrr ctab No bruit in neck CN 2-12 wnl B, S/S/DTR wnl x4

## 2011-12-14 NOTE — Patient Instructions (Addendum)
Stop the meclizine for now.  Take valium as needed and keep using the exercises at home.  If not better, I want you to go to vestibular rehab.  See Shirlee Limerick about your referral before you leave today. Don't drive or go to work until your vertigo is resolved.  Take care.

## 2011-12-16 DIAGNOSIS — R42 Dizziness and giddiness: Secondary | ICD-10-CM | POA: Insufficient documentation

## 2011-12-16 NOTE — Assessment & Plan Note (Signed)
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 Add on valium prn.  No tinnitus to suggest meniere's.  Will refer to vestibular rehab and he'll continue home exercises.

## 2011-12-17 ENCOUNTER — Telehealth: Payer: Self-pay | Admitting: *Deleted

## 2011-12-17 ENCOUNTER — Telehealth: Payer: Self-pay

## 2011-12-17 NOTE — Telephone Encounter (Signed)
letter printed, can return if vertigo is improved.

## 2011-12-17 NOTE — Telephone Encounter (Signed)
Patient advised. Note left at front desk for pick up.  

## 2011-12-17 NOTE — Telephone Encounter (Signed)
Wife says he will not be going to the rehab classes any longer.

## 2011-12-17 NOTE — Telephone Encounter (Signed)
Pt seen 12/14/11; pt left v/m requesting letter to return to work.Please advise.

## 2011-12-17 NOTE — Telephone Encounter (Signed)
Please cancel the referral for vestibular rehab.

## 2012-02-08 ENCOUNTER — Encounter: Payer: Self-pay | Admitting: Family Medicine

## 2012-02-08 ENCOUNTER — Ambulatory Visit (INDEPENDENT_AMBULATORY_CARE_PROVIDER_SITE_OTHER): Payer: 59 | Admitting: Family Medicine

## 2012-02-08 VITALS — BP 112/76 | HR 85 | Temp 98.6°F | Wt 236.0 lb

## 2012-02-08 DIAGNOSIS — B349 Viral infection, unspecified: Secondary | ICD-10-CM

## 2012-02-08 DIAGNOSIS — B9789 Other viral agents as the cause of diseases classified elsewhere: Secondary | ICD-10-CM

## 2012-02-08 MED ORDER — FLUTICASONE-SALMETEROL 100-50 MCG/DOSE IN AEPB: INHALATION_SPRAY | RESPIRATORY_TRACT | Status: DC

## 2012-02-08 MED ORDER — ALBUTEROL SULFATE HFA 108 (90 BASE) MCG/ACT IN AERS: INHALATION_SPRAY | RESPIRATORY_TRACT | Status: DC

## 2012-02-08 NOTE — Patient Instructions (Addendum)
Your pressure was fine today (and has been at prev visits). Use the advair twice a day, rinse after use.   Use the albuterol as needed.  Take care.  Try rest your voice.  Drink plenty of fluids.

## 2012-02-08 NOTE — Progress Notes (Signed)
Did a health fair at work.  BP was 140/90.  Recheck was 130/90 that day.  BP improved today.   Yesterday with L ear pain, cough, congestion. Baseline using SABA 2-3 times a week.  Still on advair but only once a day.  SABA helps with wheeze.  Voice is altered.  Some sputum.    Meds, vitals, and allergies reviewed.   ROS: See HPI.  Otherwise, noncontributory.  GEN: nad, alert and oriented HEENT: mucous membranes moist, tm w/o erythema, L TM with chronic changes, nasal exam w/o erythema, clear discharge noted,  OP with cobblestoning NECK: supple w/o LA CV: rrr.   PULM: ctab except for mild UAN, no inc wob EXT: no edema SKIN: no acute rash

## 2012-02-09 DIAGNOSIS — B349 Viral infection, unspecified: Secondary | ICD-10-CM | POA: Insufficient documentation

## 2012-02-09 NOTE — Assessment & Plan Note (Signed)
Likely viral, nontoxic, inc advair to bid and use SABA prn.  F/u prn.  Should resolve.  He can get a flu shot when feeling better, discussed. Usually done at work but he missed the day for that.

## 2012-02-24 ENCOUNTER — Other Ambulatory Visit: Payer: Self-pay | Admitting: Family Medicine

## 2012-05-05 ENCOUNTER — Other Ambulatory Visit: Payer: Self-pay | Admitting: Family Medicine

## 2012-05-05 NOTE — Telephone Encounter (Signed)
Electronic refill request.  Please advise. 

## 2012-05-06 NOTE — Telephone Encounter (Signed)
Sent!

## 2012-07-07 ENCOUNTER — Other Ambulatory Visit: Payer: Self-pay | Admitting: Family Medicine

## 2012-08-25 ENCOUNTER — Ambulatory Visit: Payer: 59 | Admitting: Family Medicine

## 2012-10-07 ENCOUNTER — Encounter: Payer: Self-pay | Admitting: Family Medicine

## 2012-10-07 ENCOUNTER — Ambulatory Visit (INDEPENDENT_AMBULATORY_CARE_PROVIDER_SITE_OTHER): Payer: 59 | Admitting: Family Medicine

## 2012-10-07 VITALS — BP 108/68 | HR 68 | Temp 98.6°F | Ht 74.0 in | Wt 235.8 lb

## 2012-10-07 DIAGNOSIS — J029 Acute pharyngitis, unspecified: Secondary | ICD-10-CM | POA: Insufficient documentation

## 2012-10-07 LAB — POCT RAPID STREP A (OFFICE): Rapid Strep A Screen: NEGATIVE

## 2012-10-07 NOTE — Assessment & Plan Note (Signed)
With mild throat injection / neg RST Disc symptomatic care - see instructions on AVS  Warned that this may be an early uri  Update if not starting to improve in a week or if worsening

## 2012-10-07 NOTE — Progress Notes (Signed)
Subjective:    Patient ID: Dean Bishop, male    DOB: [birth year], 47 y.o.   MRN: [id number]  HPI Here with a sore throat  Started yesterday- abruptly and he gargled with salt water No sinus or ear symptoms Hurts to swallow this am   Traveled to [place] 2 wk ago-? Exp to  No one is sick at home   No fever now or at home  No rash  No tick bites   Patient Active Problem List   Diagnosis Date Noted  . Viral syndrome 02/09/2012  . Vertigo 12/16/2011  . 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 Elevated BP 01/18/2011  . IRRITABILITY 11/04/2009  . HEMORRHOIDS, INTERNAL, THROMBOSED 06/27/2009  . HYPERLIPIDEMIA 05/04/2009  . DEPRESSION/ANXIETY 08/13/2007  . SOB 08/13/2007  . ALLERGY, ENVIRONMENTAL 08/13/2007   Past Medical History  Diagnosis Date  . Asthma     Dr. Barnetta Chapel  . Anxiety   . Depression   . Internal thrombosed hemorrhoids   . Hyperlipidemia   . Irritability   . Infective otitis externa, unspecified   . Acute sinusitis, unspecified   . Shortness of breath   . Special screening for malignant neoplasm of prostate   . 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 History  Substance Use Topics  . Smoking status: Former Smoker    Types: Cigarettes    Quit date: 01/25/2001  . Smokeless tobacco: Former Neurosurgeon    Quit date: 03/26/2000     Comment: quit over 10 years ago  . Alcohol Use: 0.0 oz/week     Comment: Minimal   Family History  Problem Relation Age of Onset  . Alcohol abuse Father   . Diabetes Father   . Heart disease Father   . Heart disease Paternal Grandfather     CABG x 2  . Cancer Neg Hx   . Stroke Neg Hx    No Known Allergies Current Outpatient Prescriptions on File Prior to Visit  Medication Sig Dispense Refill  . albuterol (PROAIR HFA) 108 (90 BASE) MCG/ACT inhaler INHALE 2 PUFFS EVERY 6HRS AS NEEDED FOR COUGH AND WHEEZING  1 Inhaler  12  . azelastine (OPTIVAR) 0.05 % ophthalmic solution Place  1 drop into both eyes as needed.        . Fluticasone-Salmeterol (ADVAIR DISKUS) 100-50 MCG/DOSE AEPB INHALE 1 DOSE BY MOUTH TWICE DAILY. RINSE MOUTH AFTER USE  60 each  12  . ibuprofen (ADVIL,MOTRIN) 200 MG tablet Take 200 mg by mouth every 6 (six) hours as needed.      . meclizine (ANTIVERT) 12.5 MG tablet Take 1-2 tablets (12.5-25 mg total) by mouth 3 (three) times daily as needed for dizziness.  30 tablet  3   No current facility-administered medications on file prior to visit.      Review of Systems Review of Systems  Constitutional: Negative for fever, appetite change, fatigue and unexpected weight change.  ENt neg for congestion or sinus or ear pain  Eyes: Negative for pain and visual disturbance.  Respiratory: Negative for cough and shortness of breath.   Cardiovascular: Negative for cp or palpitations    Gastrointestinal: Negative for nausea, diarrhea and constipation.  Genitourinary: Negative for urgency and frequency.  Skin: Negative for pallor or rash   Neurological: Negative for weakness, light-headedness, numbness and headaches.  Hematological: Negative for adenopathy. Does not bruise/bleed easily.  Psychiatric/Behavioral: Negative for dysphoric mood. The patient is not nervous/anxious.  Objective:   Physical Exam  Constitutional: He appears well-developed and well-nourished. No distress.  HENT:  Head: Normocephalic and atraumatic.  Right Ear: External ear normal.  Left Ear: External ear normal.  Nose: Nose normal.  Mouth/Throat: Oropharynx is clear and moist. No oropharyngeal exudate.  Throat is mildly injected posteriorly No sinus tenderness Some clear post nasal drip  Eyes: Conjunctivae and EOM are normal. Pupils are equal, round, and reactive to light. Right eye exhibits no discharge. Left eye exhibits no discharge.  Neck: Neck supple.  Cardiovascular: Normal rate and regular rhythm.   Pulmonary/Chest: Effort normal and breath sounds normal. No  respiratory distress. He has no wheezes. He has no rales.  Lymphadenopathy:    He has no cervical adenopathy.  Neurological: He is alert.  Skin: Skin is warm and dry. No rash noted. No erythema.  Psychiatric: He has a normal mood and affect.          Assessment & Plan:

## 2012-10-07 NOTE — Patient Instructions (Addendum)
I think you have a viral sore throat - this may or may not turn into a cold  Drink lots of fluids Tylenol and salt water gargles for sore throat Update if not starting to improve in a week or if worsening

## 2012-10-29 ENCOUNTER — Encounter: Payer: Self-pay | Admitting: Family Medicine

## 2012-10-29 ENCOUNTER — Ambulatory Visit (INDEPENDENT_AMBULATORY_CARE_PROVIDER_SITE_OTHER): Payer: 59 | Admitting: Family Medicine

## 2012-10-29 VITALS — BP 126/86 | HR 69 | Temp 98.4°F | Ht 74.0 in | Wt 239.2 lb

## 2012-10-29 DIAGNOSIS — B9689 Other specified bacterial agents as the cause of diseases classified elsewhere: Secondary | ICD-10-CM

## 2012-10-29 DIAGNOSIS — J019 Acute sinusitis, unspecified: Secondary | ICD-10-CM

## 2012-10-29 MED ORDER — AMOXICILLIN-POT CLAVULANATE 875-125 MG PO TABS: 1.0000 | ORAL_TABLET | Freq: Two times a day (BID) | ORAL | Status: DC

## 2012-10-29 NOTE — Patient Instructions (Addendum)
Drink lots of fluids  Sudafed is ok for symptoms  Try nasal saline spray and breathing steam also  Take the augmentin as directed  Update if not starting to improve in a week or if worsening

## 2012-10-29 NOTE — Progress Notes (Signed)
Subjective:    Patient ID: Dean Bishop, male    DOB: Nov 29, 1965, 47 y.o.   MRN: [id number]  HPI Here with sinus symptoms - worried about a sinus infection Was here mid July with ST- it got better  Now very congested and pain in R side of his face - hurts to touch face under eye Thinks his glands are swollen  Not blowing a lot of mucous out - sudafed helps along with an otc antihistamine  Has allergies also and asthma  Asthma is under control No cough with this illness No fever   Ear hurts on the R side   Patient Active Problem List   Diagnosis Date Noted  . Viral pharyngitis 10/07/2012  . Viral syndrome 02/09/2012  . Vertigo 12/16/2011  . 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 Elevated BP 01/18/2011  . IRRITABILITY 11/04/2009  . HEMORRHOIDS, INTERNAL, THROMBOSED 06/27/2009  . HYPERLIPIDEMIA 05/04/2009  . DEPRESSION/ANXIETY 08/13/2007  . SOB 08/13/2007  . ALLERGY, ENVIRONMENTAL 08/13/2007   Past Medical History  Diagnosis Date  . Asthma     Dr. Barnetta Chapel  . Anxiety   . Depression   . Internal thrombosed hemorrhoids   . Hyperlipidemia   . Irritability   . Infective otitis externa, unspecified   . Acute sinusitis, unspecified   . Shortness of breath   . Special screening for malignant neoplasm of prostate   . 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 History  Substance Use Topics  . Smoking status: Former Smoker    Types: Cigarettes    Quit date: 01/25/2001  . Smokeless tobacco: Former Neurosurgeon    Quit date: 03/26/2000     Comment: quit over 10 years ago  . Alcohol Use: 0.0 oz/week     Comment: Minimal   Family History  Problem Relation Age of Onset  . Alcohol abuse Father   . Diabetes Father   . Heart disease Father   . Heart disease Paternal Grandfather     CABG x 2  . Cancer Neg Hx   . Stroke Neg Hx    No Known Allergies Current Outpatient Prescriptions on File Prior to Visit  Medication Sig  Dispense Refill  . albuterol (PROAIR HFA) 108 (90 BASE) MCG/ACT inhaler INHALE 2 PUFFS EVERY 6HRS AS NEEDED FOR COUGH AND WHEEZING  1 Inhaler  12  . azelastine (OPTIVAR) 0.05 % ophthalmic solution Place 1 drop into both eyes as needed.        Marland Kitchen FLUoxetine (PROZAC) 20 MG capsule Take 10 mg by mouth every other day.       . Fluticasone-Salmeterol (ADVAIR DISKUS) 100-50 MCG/DOSE AEPB INHALE 1 DOSE BY MOUTH TWICE DAILY. RINSE MOUTH AFTER USE  60 each  12  . ibuprofen (ADVIL,MOTRIN) 200 MG tablet Take 200 mg by mouth every 6 (six) hours as needed.      . meclizine (ANTIVERT) 12.5 MG tablet Take 1-2 tablets (12.5-25 mg total) by mouth 3 (three) times daily as needed for dizziness.  30 tablet  3   No current facility-administered medications on file prior to visit.    Review of Systems Review of Systems  Constitutional: Negative for fever, appetite change,  and unexpected weight change. pos for fatigue ENT pos for congestion/ facial pain / ear pain / neg for ear drainage Eyes: Negative for pain and visual disturbance.  Respiratory: Negative for cough and shortness of breath.  neg for wheeze Cardiovascular: Negative  for cp or palpitations    Gastrointestinal: Negative for nausea, diarrhea and constipation.  Genitourinary: Negative for urgency and frequency.  Skin: Negative for pallor or rash   Neurological: Negative for weakness, light-headedness, numbness and headaches.  Hematological: Negative for adenopathy. Does not bruise/bleed easily.  Psychiatric/Behavioral: Negative for dysphoric mood. The patient is not nervous/anxious.         Objective:   Physical Exam  Constitutional: He appears well-developed and well-nourished. No distress.  HENT:  Head: Normocephalic and atraumatic.  Left Ear: External ear normal.  Mouth/Throat: Oropharynx is clear and moist. No oropharyngeal exudate.  Nares are injected and congested  R ethmoid and maxillary sinus tenderness TM R is dull and slt pink with  effusion Clear post nasal drip noted  Eyes: Conjunctivae and EOM are normal. Pupils are equal, round, and reactive to light. Right eye exhibits no discharge. Left eye exhibits no discharge.  Neck: Normal range of motion. Neck supple. No JVD present. No thyromegaly present.  Cardiovascular: Normal rate, regular rhythm and intact distal pulses.   Pulmonary/Chest: Effort normal and breath sounds normal. No respiratory distress. He has no wheezes. He has no rales.  Lymphadenopathy:    He has no cervical adenopathy.  Neurological: He is alert. He has normal reflexes. No cranial nerve deficit. He exhibits normal muscle tone. Coordination normal.  Skin: Skin is warm and dry. No rash noted. No erythema. No pallor.  Psychiatric: He has a normal mood and affect.          Assessment & Plan:

## 2012-10-30 NOTE — Assessment & Plan Note (Signed)
Cover with augmentin  Disc symptomatic care - see instructions on AVS  Update if not starting to improve in a week or if worsening   

## 2012-12-08 ENCOUNTER — Encounter: Payer: Self-pay | Admitting: Family Medicine

## 2012-12-08 ENCOUNTER — Ambulatory Visit (INDEPENDENT_AMBULATORY_CARE_PROVIDER_SITE_OTHER): Payer: 59 | Admitting: Family Medicine

## 2012-12-08 VITALS — BP 130/90 | HR 65 | Temp 98.4°F | Wt 241.5 lb

## 2012-12-08 DIAGNOSIS — J020 Streptococcal pharyngitis: Secondary | ICD-10-CM

## 2012-12-08 DIAGNOSIS — J029 Acute pharyngitis, unspecified: Secondary | ICD-10-CM

## 2012-12-08 LAB — POCT RAPID STREP A (OFFICE): Rapid Strep A Screen: NEGATIVE

## 2012-12-08 MED ORDER — FLUTICASONE PROPIONATE 50 MCG/ACT NA SUSP: 2.0000 | Freq: Every day | NASAL | Status: DC

## 2012-12-08 NOTE — Patient Instructions (Signed)
Use the flonase and let me know if this doesn't improve.  Take care.

## 2012-12-08 NOTE — Progress Notes (Signed)
Saw Tower about 1 month ago.  Ear ache and ST.  Prev treated with augmentin, taken and completed. Sinus pressure and pain resolved.  He has known ETD recently.  Still with ST for the last month.  Had prev last voice, resolved now, voice back to baseline. Pain swallowing.  ST pain tends to fluctuate.  No fevers recently, none in the last week.  It is unclear if he never improved or caught another illness in the meantime. Taste is at baseline. No metallic taste in his mouth.    He has been rinsing after advair use.    Meds, vitals, and allergies reviewed.   ROS: See HPI.  Otherwise, noncontributory.  GEN: nad, alert and oriented HEENT: mucous membranes moist, tm w/o erythema, nasal exam w/o erythema, clear discharge noted,  OP with cobblestoning, sinuses not ttp x4 NECK: supple w/o LA CV: rrr.   PULM: ctab, no inc wob EXT: no edema SKIN: no acute rash  RST neg.

## 2012-12-09 DIAGNOSIS — J029 Acute pharyngitis, unspecified: Secondary | ICD-10-CM | POA: Insufficient documentation

## 2012-12-09 NOTE — Assessment & Plan Note (Signed)
Likely from postnasal gtt, either viral and or allergic.  Use flonase and fu prn.  Unlikely to benefit from abx. D/w pt.  He agrees.

## 2012-12-22 ENCOUNTER — Encounter: Payer: Self-pay | Admitting: Family Medicine

## 2012-12-29 ENCOUNTER — Telehealth: Payer: Self-pay

## 2012-12-29 DIAGNOSIS — J029 Acute pharyngitis, unspecified: Secondary | ICD-10-CM

## 2012-12-29 NOTE — Telephone Encounter (Signed)
Both are great options. I would go where he can be seen first. Referral is in.

## 2012-12-29 NOTE — Telephone Encounter (Signed)
Pt request ENT referral; throat is still very painful and very hoarse but no fever and pt wants referral in either [place] or [place]; whichever Dr Para March thinks best.

## 2013-01-01 NOTE — Telephone Encounter (Signed)
Appt made and patient notified. MK

## 2013-03-15 ENCOUNTER — Other Ambulatory Visit: Payer: Self-pay | Admitting: Family Medicine

## 2013-03-16 NOTE — Telephone Encounter (Signed)
Verify with patient, would continue as he had been taking it.  Please send as is, notify me if not correct.  Thanks.

## 2013-03-16 NOTE — Telephone Encounter (Signed)
Received refill request electronically. Request does not match med sheet which shows 1/2 every other day. Please advise.

## 2013-03-17 MED ORDER — FLUOXETINE HCL 10 MG PO CAPS: 10.0000 mg | ORAL_CAPSULE | Freq: Every day | ORAL | Status: DC

## 2013-03-17 NOTE — Telephone Encounter (Signed)
Left message on patient's voicemail to return call

## 2013-05-04 ENCOUNTER — Ambulatory Visit (INDEPENDENT_AMBULATORY_CARE_PROVIDER_SITE_OTHER): Payer: 59 | Admitting: Family Medicine

## 2013-05-04 ENCOUNTER — Encounter: Payer: Self-pay | Admitting: Family Medicine

## 2013-05-04 VITALS — BP 140/84 | HR 68 | Temp 97.8°F | Wt 246.5 lb

## 2013-05-04 DIAGNOSIS — J45909 Unspecified asthma, uncomplicated: Secondary | ICD-10-CM

## 2013-05-04 MED ORDER — BECLOMETHASONE DIPROPIONATE 80 MCG/ACT IN AERS: 1.0000 | INHALATION_SPRAY | Freq: Two times a day (BID) | RESPIRATORY_TRACT | Status: DC

## 2013-05-04 NOTE — Progress Notes (Signed)
Pre-visit discussion using our clinic review tool. No additional management support is needed unless otherwise documented below in the visit note.  Had prev seen ENT last year when he had a hoarse voice. It was possibly due to advair.  He needed to restart the advair in the interval and lost his voice again, in a similar way.  SABA isn't working enough to last through his sx. Every time he has restarted advair, he's had trouble with voice loss. No FCNAVD.  Wheezing noted.  Asking about options. He wheezes less and needs less SABA when on advair.   Meds, vitals, and allergies reviewed.   ROS: See HPI.  Otherwise, noncontributory.  nad ncat Tm wnl Nasal and OP exam wnl Hoarse voice Neck supple, no LA rrr ctab No wheeze

## 2013-05-04 NOTE — Patient Instructions (Signed)
Stop advair, change to qvar, and update me next week (sooner if needed).  Take care.  Glad to see you.

## 2013-05-05 DIAGNOSIS — J45909 Unspecified asthma, uncomplicated: Secondary | ICD-10-CM | POA: Insufficient documentation

## 2013-05-05 NOTE — Assessment & Plan Note (Signed)
Likely an issue with the advair powder, not the steroid itself. Already gargling after use.  Would stop advair, change to different delivery of steroid, continue prn SABA in meantime.  Noted that his asthma does improve with advair, just the voice changes get worse.  He agrees. Okay for outpatient f/u.  Call back as needed.

## 2013-09-28 ENCOUNTER — Ambulatory Visit (INDEPENDENT_AMBULATORY_CARE_PROVIDER_SITE_OTHER): Payer: 59 | Admitting: Family Medicine

## 2013-09-28 ENCOUNTER — Encounter: Payer: Self-pay | Admitting: Family Medicine

## 2013-09-28 VITALS — BP 136/96 | HR 74 | Temp 97.9°F | Wt 248.5 lb

## 2013-09-28 DIAGNOSIS — J069 Acute upper respiratory infection, unspecified: Secondary | ICD-10-CM

## 2013-09-28 MED ORDER — AMOXICILLIN-POT CLAVULANATE 875-125 MG PO TABS: 1.0000 | ORAL_TABLET | Freq: Two times a day (BID) | ORAL | Status: DC

## 2013-09-28 NOTE — Patient Instructions (Signed)
Start the flonase today and use the antibiotics if not better in a few days.   Take care.  Glad to see you.

## 2013-09-28 NOTE — Progress Notes (Signed)
Pre visit review using our clinic review tool, if applicable. No additional management support is needed unless otherwise documented below in the visit note.  He is much improved on qvar, no ST now.  Rare SABA use.    Recently with R sided facial pain, R ear pain.  No fevers.  No ST.  Mild congestion and rhinorrhea.  No L ear pain.  No cough.  No sputum.  R side of neck, inferior to angle of the jaw is tender/sore.    Meds, vitals, and allergies reviewed.   ROS: See HPI.  Otherwise, noncontributory.  GEN: nad, alert and oriented HEENT: mucous membranes moist, tm w/o erythema, nasal exam w/o erythema, clear discharge noted,  OP with cobblestoning, R max>frontal sinus ttp NECK: supple w/ shotty R sided LA CV: rrr.   PULM: ctab, no inc wob EXT: no edema SKIN: no acute rash

## 2013-09-29 DIAGNOSIS — J069 Acute upper respiratory infection, unspecified: Secondary | ICD-10-CM | POA: Insufficient documentation

## 2013-09-29 NOTE — Assessment & Plan Note (Signed)
D/w pt.  This could be viral, early in process.  Nontoxic.  He agrees.  Hold abx for now, use nasal steroid and start abx if not getting better.  He agrees.  F/u prn.

## 2013-10-25 ENCOUNTER — Other Ambulatory Visit: Payer: Self-pay | Admitting: Family Medicine

## 2014-02-03 ENCOUNTER — Ambulatory Visit (INDEPENDENT_AMBULATORY_CARE_PROVIDER_SITE_OTHER): Payer: 59 | Admitting: Internal Medicine

## 2014-02-03 ENCOUNTER — Encounter: Payer: Self-pay | Admitting: Internal Medicine

## 2014-02-03 VITALS — BP 120/84 | HR 81 | Temp 98.1°F | Wt 242.0 lb

## 2014-02-03 DIAGNOSIS — H6692 Otitis media, unspecified, left ear: Secondary | ICD-10-CM

## 2014-02-03 MED ORDER — AMOXICILLIN-POT CLAVULANATE 875-125 MG PO TABS: 1.0000 | ORAL_TABLET | Freq: Two times a day (BID) | ORAL | Status: DC

## 2014-02-03 NOTE — Progress Notes (Signed)
Subjective:    Patient ID: Dean Bishop, male    DOB: 02/02/1966, 48 y.o.   MRN: [id number]  HPI  Pt presents to the clinic today with c/o left ear pain. He reports this started within the last 2 weeks. He also has pain in the left side of his throat. He reports the pain is stabbing in nature. He has not had any decreased hearing in that ear or noted any drainage. He denies fever, chills or other URI symptoms. He has not taken anything OTC. He has not had sick contacts that he is aware of.   Review of Systems      Past Medical History  Diagnosis Date  . Asthma     Dr. Barnetta ChapelWhelan  . Anxiety   . Depression   . Internal thrombosed hemorrhoids   . Hyperlipidemia   . Irritability   . Infective otitis externa, unspecified   . Acute sinusitis, unspecified   . Shortness of breath   . Special screening for malignant neoplasm of prostate   . Allergy     Vasomotor / Environmental / Chemicals at work    Current Outpatient Prescriptions  Medication Sig Dispense Refill  . albuterol (PROAIR HFA) 108 (90 BASE) MCG/ACT inhaler INHALE 2 PUFFS EVERY 6HRS AS NEEDED FOR COUGH AND WHEEZING 1 Inhaler 12  . azelastine (OPTIVAR) 0.05 % ophthalmic solution Place 1 drop into both eyes as needed.      . beclomethasone (QVAR) 80 MCG/ACT inhaler Inhale 1-2 puffs into the lungs 2 (two) times daily. 1 Inhaler 12  . FLUoxetine (PROZAC) 10 MG capsule TAKE 1 CAPSULE (10 MG TOTAL) BY MOUTH DAILY. 90 capsule 0  . fluticasone (FLONASE) 50 MCG/ACT nasal spray Place 2 sprays into the nose daily. 16 g 1  . ibuprofen (ADVIL,MOTRIN) 200 MG tablet Take 200 mg by mouth every 6 (six) hours as needed.    . meclizine (ANTIVERT) 12.5 MG tablet Take 1-2 tablets (12.5-25 mg total) by mouth 3 (three) times daily as needed for dizziness. 30 tablet 3   No current facility-administered medications for this visit.    Allergies  Allergen Reactions  . Advair Diskus [Fluticasone-Salmeterol]     Hoarse voice    Family History    Problem Relation Age of Onset  . Alcohol abuse Father   . Diabetes Father   . Heart disease Father   . Heart disease Paternal Grandfather     CABG x 2  . Cancer Neg Hx   . Stroke Neg Hx     History   Social History  . Marital Status: Married    Spouse Name: N/A    Number of Children: 1  . Years of Education: N/A   Occupational History  . Acupuncturistlectrical Engineer     [place]   Social History Main Topics  . Smoking status: Former Smoker    Types: Cigarettes    Quit date: 01/25/2001  . Smokeless tobacco: Former NeurosurgeonUser    Quit date: 03/26/2000     Comment: quit over 10 years ago  . Alcohol Use: 0.0 oz/week     Comment: Minimal  . Drug Use: No  . Sexual Activity: Not on file   Other Topics Concern  . Not on file   Social History Narrative   Acupuncturistlectrical Engineer, [employer] grad, Married 1993.      Constitutional: Denies fever, malaise, fatigue, headache or abrupt weight changes.  HEENT: Pt reports left ear pain. Denies eye pain, eye redness, ringing in  the ears, wax buildup, runny nose, nasal congestion, bloody nose, or sore throat. Respiratory: Denies difficulty breathing, shortness of breath, cough or sputum production.   Cardiovascular: Denies chest pain, chest tightness, palpitations or swelling in the hands or feet.    No other specific complaints in a complete review of systems (except as listed in HPI above).  Objective:   Physical Exam  BP 120/84 mmHg  Pulse 81  Temp(Src) 98.1 F (36.7 C) (Oral)  Wt 242 lb (109.77 kg)  SpO2 98% Wt Readings from Last 3 Encounters:  02/03/14 242 lb (109.77 kg)  09/28/13 248 lb 8 oz (112.719 kg)  05/04/13 246 lb 8 oz (111.812 kg)    General: Appears his stated age, well developed, well nourished in NAD. HEENT: Head: normal shape and size; Left Ears: Tm's red and bulging, distorted light reflex; Nose: mucosa pink and moist, septum midline; Throat/Mouth: Teeth present, mucosa pink and moist, no exudate, lesions or ulcerations  noted.  Cardiovascular: Normal rate and rhythm. S1,S2 noted.  No murmur, rubs or gallops noted.  Pulmonary/Chest: Normal effort and positive vesicular breath sounds. No respiratory distress. No wheezes, rales or ronchi noted.    BMET    Component Value Date/Time   NA 143 01/18/2011 1510   K 4.6 01/18/2011 1510   CL 108 01/18/2011 1510   CO2 29 01/18/2011 1510   GLUCOSE 83 01/18/2011 1510   BUN 22 01/18/2011 1510   CREATININE 0.8 01/18/2011 1510   CALCIUM 9.3 01/18/2011 1510   GFRNONAA 86.33 05/06/2009 1447    Lipid Panel     Component Value Date/Time   CHOL 204* 07/25/2009 1446   TRIG 67.0 07/25/2009 1446   HDL 38.80* 07/25/2009 1446   CHOLHDL 5 07/25/2009 1446   VLDL 13.4 07/25/2009 1446    CBC    Component Value Date/Time   WBC 6.6 01/18/2011 1510   RBC 4.79 01/18/2011 1510   HGB 14.4 01/18/2011 1510   HCT 42.4 01/18/2011 1510   PLT 222.0 01/18/2011 1510   MCV 88.6 01/18/2011 1510   MCHC 33.9 01/18/2011 1510   RDW 13.4 01/18/2011 1510   LYMPHSABS 2.3 01/18/2011 1510   MONOABS 0.3 01/18/2011 1510   EOSABS 0.3 01/18/2011 1510   BASOSABS 0.0 01/18/2011 1510    Hgb A1C No results found for: HGBA1C       Assessment & Plan:   Questionable left otitis media:  eRx for Augmentin BID x 10 days Ibuprofen for pain relief  RTC as needed or if symptoms persist or worsen

## 2014-02-03 NOTE — Addendum Note (Signed)
Addended by: Roena MaladyEVONTENNO, Daijon Wenke Y on: 02/03/2014 04:26 PM   Modules accepted: Orders

## 2014-02-03 NOTE — Progress Notes (Signed)
Pre visit review using our clinic review tool, if applicable. No additional management support is needed unless otherwise documented below in the visit note. 

## 2014-02-03 NOTE — Patient Instructions (Signed)
Otalgia  The most common reason for this in children is an infection of the middle ear. Pain from the middle ear is usually caused by a build-up of fluid and pressure behind the eardrum. Pain from an earache can be sharp, dull, or burning. The pain may be temporary or constant. The middle ear is connected to the nasal passages by a short narrow tube called the Eustachian tube. The Eustachian tube allows fluid to drain out of the middle ear, and helps keep the pressure in your ear equalized.  CAUSES   A cold or allergy can block the Eustachian tube with inflammation and the build-up of secretions. This is especially likely in small children, because their Eustachian tube is shorter and more horizontal. When the Eustachian tube closes, the normal flow of fluid from the middle ear is stopped. Fluid can accumulate and cause stuffiness, pain, hearing loss, and an ear infection if germs start growing in this area.  SYMPTOMS   The symptoms of an ear infection may include fever, ear pain, fussiness, increased crying, and irritability. Many children will have temporary and minor hearing loss during and right after an ear infection. Permanent hearing loss is rare, but the risk increases the more infections a child has. Other causes of ear pain include retained water in the outer ear canal from swimming and bathing.  Ear pain in adults is less likely to be from an ear infection. Ear pain may be referred from other locations. Referred pain may be from the joint between your jaw and the skull. It may also come from a tooth problem or problems in the neck. Other causes of ear pain include:   A foreign body in the ear.   Outer ear infection.   Sinus infections.   Impacted ear wax.   Ear injury.   Arthritis of the jaw or TMJ problems.   Middle ear infection.   Tooth infections.   Sore throat with pain to the ears.  DIAGNOSIS   Your caregiver can usually make the diagnosis by examining you. Sometimes other special studies,  including x-rays and lab work may be necessary.  TREATMENT    If antibiotics were prescribed, use them as directed and finish them even if you or your child's symptoms seem to be improved.   Sometimes PE tubes are needed in children. These are little plastic tubes which are put into the eardrum during a simple surgical procedure. They allow fluid to drain easier and allow the pressure in the middle ear to equalize. This helps relieve the ear pain caused by pressure changes.  HOME CARE INSTRUCTIONS    Only take over-the-counter or prescription medicines for pain, discomfort, or fever as directed by your caregiver. DO NOT GIVE CHILDREN ASPIRIN because of the association of Reye's Syndrome in children taking aspirin.   Use a cold pack applied to the outer ear for 15-20 minutes, 03-04 times per day or as needed may reduce pain. Do not apply ice directly to the skin. You may cause frost bite.   Over-the-counter ear drops used as directed may be effective. Your caregiver may sometimes prescribe ear drops.   Resting in an upright position may help reduce pressure in the middle ear and relieve pain.   Ear pain caused by rapidly descending from high altitudes can be relieved by swallowing or chewing gum. Allowing infants to suck on a bottle during airplane travel can help.   Do not smoke in the house or near children. If you are   unable to quit smoking, smoke outside.   Control allergies.  SEEK IMMEDIATE MEDICAL CARE IF:    You or your child are becoming sicker.   Pain or fever relief is not obtained with medicine.   You or your child's symptoms (pain, fever, or irritability) do not improve within 24 to 48 hours or as instructed.   Severe pain suddenly stops hurting. This may indicate a ruptured eardrum.   You or your children develop new problems such as severe headaches, stiff neck, difficulty swallowing, or swelling of the face or around the ear.  Document Released: 10/28/2003 Document Revised: 06/04/2011  Document Reviewed: 03/03/2008  ExitCare Patient Information 2015 [employer]. This information is not intended to replace advice given to you by your health care provider. Make sure you discuss any questions you have with your health care provider.

## 2014-03-08 ENCOUNTER — Ambulatory Visit (INDEPENDENT_AMBULATORY_CARE_PROVIDER_SITE_OTHER): Payer: 59 | Admitting: Family Medicine

## 2014-03-08 ENCOUNTER — Encounter: Payer: Self-pay | Admitting: Family Medicine

## 2014-03-08 VITALS — BP 142/90 | HR 67 | Temp 98.8°F | Wt 250.5 lb

## 2014-03-08 DIAGNOSIS — H6982 Other specified disorders of Eustachian tube, left ear: Secondary | ICD-10-CM

## 2014-03-08 DIAGNOSIS — H6980 Other specified disorders of Eustachian tube, unspecified ear: Secondary | ICD-10-CM | POA: Insufficient documentation

## 2014-03-08 MED ORDER — FLUTICASONE PROPIONATE 50 MCG/ACT NA SUSP: 2.0000 | Freq: Two times a day (BID) | NASAL | Status: DC

## 2014-03-08 NOTE — Assessment & Plan Note (Signed)
D/w pt.  Inc flonase to 2 sprays into both nostrils 2 (two) times daily and refer to ENT.  He agrees.

## 2014-03-08 NOTE — Progress Notes (Signed)
Pre visit review using our clinic review tool, if applicable. No additional management support is needed unless otherwise documented below in the visit note.  Had L ear pain treated with augmentin in 01/2014.  He had some improvement but didn't fully resolve.  He had persistent hearing difficulty in the meantime.  The prev ear pain got some better, but didn't fully resolve, with ibuprofen.   Voice is hoarse over the last few days.  He can't pop his ears usually, and when he does it is really painful.   No FCNAVD.   ST noted recently but he attributed that to allergies.  No rhinorrhea, not stuffy usually.  No R ear pain.  He had his hearing tested at work prev and had difficulty on the L side.   Never had ear tubes prev.   Already on flonase.   Meds, vitals, and allergies reviewed.   ROS: See HPI.  Otherwise, noncontributory.  nad ncat Weber heard louder on the L side, with air>bone conduction.  R TM wnl L TM w/o erythema but SOM noted.   Nasal exam slightly stuffy OP with minimal cobblestoning Sinuses not ttp x4.  Neck supple, no LA rrr ctab

## 2014-03-08 NOTE — Patient Instructions (Signed)
[name] will call about your referral. Use the flonase more often in the meantime. Take care.  Glad to see you.

## 2014-03-14 ENCOUNTER — Other Ambulatory Visit: Payer: Self-pay | Admitting: Family Medicine

## 2014-04-01 ENCOUNTER — Other Ambulatory Visit: Payer: Self-pay | Admitting: Family Medicine

## 2014-04-01 NOTE — Telephone Encounter (Signed)
Electronic refill request.   No recent CPE.  ? Refill  Last Filled:    90 capsule 0 RF on 10/26/13.  Please advise.

## 2014-04-02 NOTE — Telephone Encounter (Signed)
Okay to continue.  Sent.  Thanks. 

## 2014-07-06 ENCOUNTER — Ambulatory Visit (INDEPENDENT_AMBULATORY_CARE_PROVIDER_SITE_OTHER)
Admission: RE | Admit: 2014-07-06 | Discharge: 2014-07-06 | Disposition: A | Discharge status: Home / Self Care | Payer: 59 | Source: Ambulatory Visit | Attending: Family Medicine | Admitting: Family Medicine

## 2014-07-06 ENCOUNTER — Ambulatory Visit (INDEPENDENT_AMBULATORY_CARE_PROVIDER_SITE_OTHER): Payer: 59 | Admitting: Family Medicine

## 2014-07-06 ENCOUNTER — Encounter: Payer: Self-pay | Admitting: Family Medicine

## 2014-07-06 VITALS — BP 132/88 | HR 80 | Temp 98.8°F | Wt 248.8 lb

## 2014-07-06 DIAGNOSIS — M792 Neuralgia and neuritis, unspecified: Secondary | ICD-10-CM

## 2014-07-06 DIAGNOSIS — G545 Neuralgic amyotrophy: Secondary | ICD-10-CM | POA: Diagnosis not present

## 2014-07-06 DIAGNOSIS — M546 Pain in thoracic spine: Secondary | ICD-10-CM

## 2014-07-06 MED ORDER — PREDNISONE 20 MG PO TABS: ORAL_TABLET | ORAL | Status: DC

## 2014-07-06 NOTE — Assessment & Plan Note (Signed)
Now with radicular sx in the L arm and recently with fasciculations in the R arm, with pain and burning in the midline lower C and upper C spine.  Would check plain films and MRI of both to eval for L radicular arm pain and R arm fasciculations.  I don't know if he has nerve root compression, but the history is concerning.  Normal exam today.  No weakness.  If sudden weakness, then to ER.  He agrees.  Start pred taper in the meantime, steroid cautions given.  We'll likely have to refer after I see the films and the MRI reports.  He agrees with plan. >25 minutes spent in face to face time with patient, >50% spent in counselling or coordination of care.

## 2014-07-06 NOTE — Patient Instructions (Signed)
Go to the lab on the way out.  We'll contact you with your xray report. [name] will call about your referral. Maryclare LabradorWe'll go from there, when I can see the xrays and MRI reports Take prednisone with food in the meantime, but not with ibuprofen.

## 2014-07-06 NOTE — Progress Notes (Signed)
Pre visit review using our clinic review tool, if applicable. No additional management support is needed unless otherwise documented below in the visit note.  H/o R parsonage turner syndrome.  He had some pain as he was getting his strength back.  In the meantime, he has some burning in his back.  He can tell a difference from muscle aches from a different burning in the back, near the spine.  This has been getting worse in the last 1.5 years . Exercise makes it worse.    This weekend he was exercising, on a bike, he turned to look back to the R and felt a lump in his upper back in midline and felt muscles twitching in the R forearm.  Got off the bike, rubbed his back, and the forearm sx resolved.   Then later on he felt radicular burning in the L arm (that comes and goes at baseline).    Meds, vitals, and allergies reviewed.   ROS: See HPI.  Otherwise, noncontributory.  nad ncat Neck supple, normal ROM Not ttp in midline of neck.  S/s wnl and DTRs wnl for BUE rrr ctab S/S wnl for the BLE grossly.

## 2014-07-19 ENCOUNTER — Ambulatory Visit
Admit: 2014-07-19 | Disposition: A | Discharge status: Home / Self Care | Payer: Self-pay | Attending: Family Medicine | Admitting: Family Medicine

## 2014-07-27 ENCOUNTER — Telehealth: Payer: Self-pay | Admitting: *Deleted

## 2014-07-27 NOTE — Telephone Encounter (Signed)
Pt asking for results [facility]

## 2014-07-28 ENCOUNTER — Encounter: Payer: Self-pay | Admitting: *Deleted

## 2014-07-28 NOTE — Telephone Encounter (Signed)
Called pt last night, late entry.  There was an error in reporting- his MRIs didn't come back to me for review.  I have notified office staff about the problem for corrective action to take place about the handling of the reports, though it appears the [facility].  I told the patient this and apologized.  In the meantime, his brother died and I offered my condolences.   He was so distracted (understandibly) with the death of sibling that he forgot to take his prednisone rx.  He does have some degenerative changes on MRI, esp C spine, but his sx are some better now.  Not resolved but better.  Reasonable to take pred course with routine cautions and notify me if not better.  We can refer out for neurosurgery if needed, if not better.  He agrees, he was really gracious about the delay in reporting, and he'll update me as needed.   I thanked him again.

## 2014-09-08 ENCOUNTER — Other Ambulatory Visit: Payer: Self-pay | Admitting: Family Medicine

## 2014-09-08 NOTE — Telephone Encounter (Signed)
Okay to continue.  Has tolerated.  Thanks.  Sent.

## 2014-09-08 NOTE — Telephone Encounter (Signed)
Received refill request electronically Last office visit 07/06/14 See allergy/contraindication. Is it okay to refill medication?

## 2014-09-16 ENCOUNTER — Other Ambulatory Visit: Payer: Self-pay

## 2014-09-16 NOTE — Telephone Encounter (Signed)
Christy pts wife(DPR signed) request refill fluox[facility]. Pt last seen for back issue 06/2014 but do not see recent annual exam. Is it OK to refill and does pt need to come in for CPX. Christy request cb.

## 2014-09-17 MED ORDER — FLUOXETINE HCL 10 MG PO CAPS: ORAL_CAPSULE | ORAL | Status: DC

## 2014-09-17 NOTE — Telephone Encounter (Signed)
Left detailed message on voicemail.  

## 2014-09-17 NOTE — Telephone Encounter (Signed)
Sent.  Would schedule CPE at age 49.  Thanks.

## 2014-10-18 ENCOUNTER — Other Ambulatory Visit: Payer: Self-pay | Admitting: Family Medicine

## 2014-10-18 NOTE — Telephone Encounter (Signed)
Received refill request electronically. Last office visit 07/06/14/acute See allergy/contraindication Is it okay to refill medication?

## 2014-10-18 NOTE — Telephone Encounter (Signed)
Sent. Thanks.   

## 2015-02-03 ENCOUNTER — Encounter: Payer: Self-pay | Admitting: Family Medicine

## 2015-02-03 ENCOUNTER — Ambulatory Visit: Payer: 59 | Admitting: Family Medicine

## 2015-02-03 ENCOUNTER — Ambulatory Visit (INDEPENDENT_AMBULATORY_CARE_PROVIDER_SITE_OTHER): Payer: 59 | Admitting: Family Medicine

## 2015-02-03 VITALS — BP 143/95 | HR 85 | Temp 98.6°F | Ht 73.5 in | Wt 239.2 lb

## 2015-02-03 DIAGNOSIS — J0100 Acute maxillary sinusitis, unspecified: Secondary | ICD-10-CM | POA: Diagnosis not present

## 2015-02-03 DIAGNOSIS — J4520 Mild intermittent asthma, uncomplicated: Secondary | ICD-10-CM

## 2015-02-03 MED ORDER — AMOXICILLIN-POT CLAVULANATE 875-125 MG PO TABS: 1.0000 | ORAL_TABLET | Freq: Two times a day (BID) | ORAL | Status: DC

## 2015-02-03 NOTE — Progress Notes (Signed)
Dr. Karleen Hampshire T. Norleen Xie, MD, CAQ Sports Medicine [facility] [address] [place], [address] Phone: [phone] Fax: [phone]  02/03/2015  Patient: Dean Bishop, MRN: [id number], DOB: 06/01/1965, 49 y.o.  Primary Physician:  Crawford Givens, MD   Chief Complaint  Patient presents with  . Sinusitis   Subjective:   This 49 y.o. male patient presents with runny nose, sneezing, cough, sore throat, malaise and minimal / low-grade fever for a little less than 1 week. Now the primary complaint has become sinus pressure and pain behind the eyes and in the upper, anterior face.   R max and frontal.  Been feeling pretty bad for a couple of days.   Wheezing some right now  The patent denies sore throat as the primary complaint. Denies sthortness of breath/wheezing, high fever, chest pain, significant myalgia, otalgia, abdominal pain, changes in bowel or bladder.  PMH, PHS, Allergies, Problem List, Medications, Family History, and Social History have all been reviewed.  Patient Active Problem List   Diagnosis Date Noted  . ETD (eustachian tube dysfunction) 03/08/2014  . Asthma 05/05/2013  . Vertigo 12/16/2011  . 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 Elevated BP 01/18/2011  . IRRITABILITY 11/04/2009  . HEMORRHOIDS, INTERNAL, THROMBOSED 06/27/2009  . HYPERLIPIDEMIA 05/04/2009  . DEPRESSION/ANXIETY 08/13/2007  . ALLERGY, ENVIRONMENTAL 08/13/2007    Past Medical History  Diagnosis Date  . Asthma     Dr. Barnetta Chapel  . Anxiety   . Depression   . Internal thrombosed hemorrhoids   . Hyperlipidemia   . Irritability   . Infective otitis externa, unspecified   . Acute sinusitis, unspecified   . Shortness of breath   . Special screening for malignant neoplasm of prostate   . 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  Social History   Social History  . Marital Status: Married    Spouse Name:  N/A  . Number of Children: 1  . Years of Education: N/A   Occupational History  . Acupuncturist     [place]   Social History Main Topics  . Smoking status: Former Smoker    Types: Cigarettes    Quit date: 01/25/2001  . Smokeless tobacco: Former Neurosurgeon    Quit date: 03/26/2000     Comment: quit over 10 years ago  . Alcohol Use: 0.0 oz/week     Comment: Minimal  . Drug Use: No  . Sexual Activity: Not on file   Other Topics Concern  . Not on file   Social History Narrative   Acupuncturist, [employer] grad, Married 1993.     Family History  Problem Relation Age of Onset  . Alcohol abuse Father   . Diabetes Father   . Heart disease Father   . Heart disease Paternal Grandfather     CABG x 2  . Cancer Neg Hx   . Stroke Neg Hx     Allergies  Allergen Reactions  . Advair Diskus [Fluticasone-Salmeterol]     Hoarse voice    Medication list reviewed and updated in full in [facility].  ROS as above, eating and drinking - tolerating PO. Urinating normally. No excessive vomitting or diarrhea. O/w as above.  Objective:   Blood pressure 143/95, pulse 85, temperature 98.6 F (37 C), temperature source Oral, height 6' 1.5" (1.867 m), weight 239 lb 4 oz (108.523 kg).  GEN: WDWN, Non-toxic, Atraumatic,  normocephalic. A and O x 3. HEENT: Oropharynx clear without exudate, MMM, no significant LAD, mild rhinnorhea Sinuses: Right Frontal, ethmoid, and maxillary: Tender max Left Frontal, Ethmoid, and maxillary: Tender Ears: TM clear, COL visualized with good landmarks CV: RRR, no m/g/r. Pulm: CTA B, no wheezes, rhonchi, or crackles, normal respiratory effort. EXT: no c/c/e Psych: well oriented, neither depressed nor anxious in appearance  Assessment and Plan:   Acute maxillary sinusitis, recurrence not specified  Asthma, mild intermittent, uncomplicated  Acute sinusitis: ABX as below.   Reviewed symptomatic care as well as ABX in this case.    Follow-up: No  Follow-up on file.  New Prescriptions   AMOXICILLIN-CLAVULANATE (AUGMENTIN) 875-125 MG TABLET    Take 1 tablet by mouth 2 (two) times daily.   Modified Medications   No medications on file   No orders of the defined types were placed in this encounter.    Signed,  Elpidio GaleaSpencer T. Javayah Magaw, MD  Patient's Medications  New Prescriptions   AMOXICILLIN-CLAVULANATE (AUGMENTIN) 875-125 MG TABLET    Take 1 tablet by mouth 2 (two) times daily.  Previous Medications   ALBUTEROL (PROAIR HFA) 108 (90 BASE) MCG/ACT INHALER    INHALE 2 PUFFS EVERY 6HRS AS NEEDED FOR COUGH AND WHEEZING   AZELASTINE (OPTIVAR) 0.05 % OPHTHALMIC SOLUTION    Place 1 drop into both eyes as needed.     FLUOXETINE (PROZAC) 10 MG CAPSULE    TAKE 1 CAPSULE (10 MG TOTAL) BY MOUTH DAILY.   FLUTICASONE (FLONASE) 50 MCG/ACT NASAL SPRAY    Use 2 sprays in each nostril daily   IBUPROFEN (ADVIL,MOTRIN) 200 MG TABLET    Take 200 mg by mouth every 6 (six) hours as needed.   MECLIZINE (ANTIVERT) 12.5 MG TABLET    Take 1-2 tablets (12.5-25 mg total) by mouth 3 (three) times daily as needed for dizziness.   QVAR 80 MCG/ACT INHALER    INHALE 1-2 PUFFS INTO THE LUNGS 2 (TWO) TIMES DAILY.  Modified Medications   No medications on file  Discontinued Medications   PREDNISONE (DELTASONE) 20 MG TABLET    40mg  a day for 4 days, then 20mg  a day for 4 days, then 10mg  a day for 4 days.  With food.  Don't take with ibuprofen.

## 2015-02-03 NOTE — Progress Notes (Signed)
Pre visit review using our clinic review tool, if applicable. No additional management support is needed unless otherwise documented below in the visit note. 

## 2015-03-27 HISTORY — PX: SHOULDER SURGERY: SHX246

## 2015-09-07 ENCOUNTER — Ambulatory Visit (INDEPENDENT_AMBULATORY_CARE_PROVIDER_SITE_OTHER): Payer: 59 | Admitting: Primary Care

## 2015-09-07 VITALS — BP 140/96 | HR 69 | Temp 98.1°F | Ht 73.5 in | Wt 237.8 lb

## 2015-09-07 DIAGNOSIS — H9202 Otalgia, left ear: Secondary | ICD-10-CM | POA: Diagnosis not present

## 2015-09-07 NOTE — Progress Notes (Signed)
Subjective:    Patient ID: Dean Bishop, male    DOB: 01/11/1966, 50 y.o.   MRN: [id number]  HPI  Mr. Wilczynski is a 50 year old male with a history of eustation tube dysfunction who presents today with a chief complaint of ear pain. His pain is located to the left ear which is tender to the touch to his tragus. His pain has been present for the past 4-5 days and was much worse yesterday as today he has noticed moderate improvement. His pain is radiating down to the left side of his neck. Denies fevers, sore throat, cough, swimming, sick contacts. He's using Flonase and ibuprofen with some improvement.   Review of Systems  Constitutional: Negative for fever and chills.  HENT: Positive for ear discharge and postnasal drip. Negative for congestion and sore throat.   Respiratory: Negative for cough and shortness of breath.   Cardiovascular: Negative for chest pain.       Past Medical History  Diagnosis Date  . Asthma     Dr. Barnetta Chapel  . Anxiety   . Depression   . Internal thrombosed hemorrhoids   . Hyperlipidemia   . Irritability   . Infective otitis externa, unspecified   . Acute sinusitis, unspecified   . Shortness of breath   . Special screening for malignant neoplasm of prostate   . Allergy     Vasomotor / Environmental / Chemicals at work     Social History   Social History  . Marital Status: Married    Spouse Name: N/A  . Number of Children: 1  . Years of Education: N/A   Occupational History  . Acupuncturist     [place]   Social History Main Topics  . Smoking status: Former Smoker    Types: Cigarettes    Quit date: 01/25/2001  . Smokeless tobacco: Former Neurosurgeon    Quit date: 03/26/2000     Comment: quit over 10 years ago  . Alcohol Use: 0.0 oz/week     Comment: Minimal  . Drug Use: No  . Sexual Activity: Not on file   Other Topics Concern  . Not on file   Social History Narrative   Acupuncturist, [employer] grad, Married 1993.     No past surgical  history on file.  Family History  Problem Relation Age of Onset  . Alcohol abuse Father   . Diabetes Father   . Heart disease Father   . Heart disease Paternal Grandfather     CABG x 2  . Cancer Neg Hx   . Stroke Neg Hx     Allergies  Allergen Reactions  . Advair Diskus [Fluticasone-Salmeterol]     Hoarse voice    Current Outpatient Prescriptions on File Prior to Visit  Medication Sig Dispense Refill  . albuterol (PROAIR HFA) 108 (90 BASE) MCG/ACT inhaler INHALE 2 PUFFS EVERY 6HRS AS NEEDED FOR COUGH AND WHEEZING 1 Inhaler 12  . azelastine (OPTIVAR) 0.05 % ophthalmic solution Place 1 drop into both eyes as needed.      Marland Kitchen FLUoxetine (PROZAC) 10 MG capsule TAKE 1 CAPSULE (10 MG TOTAL) BY MOUTH DAILY. 90 capsule 3  . fluticasone (FLONASE) 50 MCG/ACT nasal spray Use 2 sprays in each nostril daily 1 g 5  . ibuprofen (ADVIL,MOTRIN) 200 MG tablet Take 200 mg by mouth every 6 (six) hours as needed.    . meclizine (ANTIVERT) 12.5 MG tablet Take 1-2 tablets (12.5-25 mg total) by mouth 3 (three)  times daily as needed for dizziness. 30 tablet 3  . QVAR 80 MCG/ACT inhaler INHALE 1-2 PUFFS INTO THE LUNGS 2 (TWO) TIMES DAILY. 8.7 g 5   No current facility-administered medications on file prior to visit.    BP 140/96 mmHg  Pulse 69  Temp(Src) 98.1 F (36.7 C) (Oral)  Ht 6' 1.5" (1.867 m)  Wt 237 lb 12.8 oz (107.865 kg)  BMI 30.95 kg/m2  SpO2 96%    Objective:   Physical Exam  Constitutional: He appears well-nourished.  HENT:  Right Ear: Tympanic membrane and ear canal normal.  Left Ear: Tympanic membrane and ear canal normal.  Nose: No mucosal edema. Right sinus exhibits no maxillary sinus tenderness and no frontal sinus tenderness. Left sinus exhibits no maxillary sinus tenderness and no frontal sinus tenderness.  Mouth/Throat: Oropharynx is clear and moist.  Eyes: Conjunctivae are normal.  Neck: Neck supple.  Cardiovascular: Normal rate and regular rhythm.   Pulmonary/Chest:  Effort normal and breath sounds normal. He has no wheezes. He has no rales.  Skin: Skin is warm and dry.          Assessment & Plan:  Otalgia:  Located to left tragus and canal for the past 4-5 days. Overall feeling much improved since yesterday when he made the appointment. Exam without evidence of effusion, infection, otitis externa. Will have him continue ibuprofen and Flonase as needed and to notify us if symptoms progress. He does not appear ill, other respiratory exam unremarkable.

## 2015-09-07 NOTE — Progress Notes (Signed)
Pre visit review using our clinic review tool, if applicable. No additional management support is needed unless otherwise documented below in the visit note. 

## 2015-09-07 NOTE — Patient Instructions (Signed)
There is no evidence of infection in your ears today.  Continue use of Flonase. Continue ibuprofen as needed.  It was a pleasure meeting you!

## 2015-10-01 ENCOUNTER — Other Ambulatory Visit: Payer: Self-pay | Admitting: Family Medicine

## 2015-10-03 NOTE — Telephone Encounter (Signed)
Electronic refill request. Last Filled:    90 capsule 3 09/17/2014  Only recent acute OV's, no CPE listed.  Please advise.

## 2015-10-04 NOTE — Telephone Encounter (Signed)
Patient advised.

## 2015-10-04 NOTE — Telephone Encounter (Signed)
Sent.  Schedule CPE when possible.  Thanks.  

## 2015-10-10 ENCOUNTER — Ambulatory Visit (INDEPENDENT_AMBULATORY_CARE_PROVIDER_SITE_OTHER): Payer: 59 | Admitting: Family Medicine

## 2015-10-10 ENCOUNTER — Encounter: Payer: Self-pay | Admitting: Family Medicine

## 2015-10-10 ENCOUNTER — Ambulatory Visit (INDEPENDENT_AMBULATORY_CARE_PROVIDER_SITE_OTHER)
Admission: RE | Admit: 2015-10-10 | Discharge: 2015-10-10 | Disposition: A | Discharge status: Home / Self Care | Payer: 59 | Source: Ambulatory Visit | Attending: Family Medicine | Admitting: Family Medicine

## 2015-10-10 VITALS — BP 142/98 | HR 78 | Temp 98.4°F | Wt 242.5 lb

## 2015-10-10 DIAGNOSIS — M25511 Pain in right shoulder: Secondary | ICD-10-CM

## 2015-10-10 DIAGNOSIS — M25519 Pain in unspecified shoulder: Secondary | ICD-10-CM | POA: Insufficient documentation

## 2015-10-10 MED ORDER — HYDROCODONE-ACETAMINOPHEN 5-325 MG PO TABS: 0.5000 | ORAL_TABLET | Freq: Every evening | ORAL | Status: DC | PRN

## 2015-10-10 NOTE — Progress Notes (Signed)
Pre visit review using our clinic review tool, if applicable. No additional management support is needed unless otherwise documented below in the visit note.  R shoulder pain.  H/o injury years ago, with racquetball.  Pain with throwing a ball at baseline.  Pain with overhead movement.  Prev injected in the shoulder years ago.  This is clearly different from prev pain with Parsonage Turner.  No L sided sx.  No recent trauma.  Can't sleep well at night due to pain.   Meds, vitals, and allergies reviewed.   ROS: Per HPI unless specifically indicated in ROS section   nad ncat Neck supple Low riding distal R clavicle on inspection Pain with int>ext R shoulder ROM but no arm drop Distally nv intact Less pain with scap assist with int and ext rotation.  Grip wnl

## 2015-10-10 NOTE — Assessment & Plan Note (Signed)
Likely cuff irritation, no arm drop.  D/w pt.  Likely scapular dysfunction with the clavicle changes noted on inspection.  +scap assist.  D/w pt.  Check plain films today, he'll take ibuprofen in the day with GI caution and vicodin at night with sedation caution.  Unable to sleep with current level of pain at night.  Anatomy and handout d/w pt re: exercises.  He can likely improve function with rehab exercises.  If not improved, we can refer to PT or Sports meds.  D/w pt.  He agrees.  He'll update me as needed.  See notes on imaging.

## 2015-10-10 NOTE — Patient Instructions (Signed)
Use the shoulder exercises in the meantime.  Ibuprofen 800mg  in the AM. 0.5-1 vicodin at night.  Update me as needed.   We can get you set up with PT and/or Dr. Patsy Lageropland as needed.   Take care.  Glad to see you.

## 2015-10-24 ENCOUNTER — Ambulatory Visit (INDEPENDENT_AMBULATORY_CARE_PROVIDER_SITE_OTHER): Payer: 59 | Admitting: Internal Medicine

## 2015-10-24 ENCOUNTER — Encounter: Payer: Self-pay | Admitting: Internal Medicine

## 2015-10-24 ENCOUNTER — Telehealth: Payer: Self-pay | Admitting: Internal Medicine

## 2015-10-24 VITALS — BP 148/86 | HR 72 | Temp 98.7°F | Wt 244.0 lb

## 2015-10-24 DIAGNOSIS — M545 Low back pain, unspecified: Secondary | ICD-10-CM

## 2015-10-24 MED ORDER — METHOCARBAMOL 750 MG PO TABS: 750.0000 mg | ORAL_TABLET | Freq: Three times a day (TID) | ORAL | 0 refills | Status: DC | PRN

## 2015-10-24 MED ORDER — OXYCODONE-ACETAMINOPHEN 5-325 MG PO TABS: 1.0000 | ORAL_TABLET | Freq: Three times a day (TID) | ORAL | 0 refills | Status: DC | PRN

## 2015-10-24 NOTE — Telephone Encounter (Signed)
Pt wife dropped of jury summons letter for pt to be excused. Please call wife with letter ready for pick up.  Summons in RX tower at front desk

## 2015-10-24 NOTE — Telephone Encounter (Signed)
Mel,   Ok for jury letter giving medical excuse for severe back pain. Unable to sit or stand for extended periods of time. Currently medically treatment with narcotics and muscle relaxers which may impair his ability to concentrate or drive.

## 2015-10-24 NOTE — Progress Notes (Signed)
Subjective:    Patient ID: Dean Bishop, male    DOB: [birth year], 50 y.o.   MRN: [id number]  HPI  Pt presents to the clinic today with a complaint of back pain x 4 days.  He reports he was in [place] last week and walked several miles a day and had right sided back pain at that time, but it  felt different than the current pain.  He then drove 11 hours back from [place], and that afternoon bent over to pick something up and felt the pain.  He describes the pain as a constant aching/burning with intermittent stabbing in the middle of his lower back that worsens when putting pressure on the lower back and with any activity.  He rates the pain at a severity of 10/10 without pain medication and a 3/10 with pain medication.  He reports occasional radiation to either side of his back with movement but denies radiation down the legs.  He denies numbness or tingling in the extremities, saddle anesthesia, or bowel incontinence.  He has tried ice, heat, IcyHot, Ibuprofen, and Norco without relief.  He has also tried his wife's leftover Percocet with some relief.  He reports this has happened before over 10 years ago, and the current symptoms feel the same as that episode.  He states he tried physical therapy with the previous episode but it made his symptoms worse so he did not return for more sessions.   Review of Systems   Past Medical History:  Diagnosis Date  . Acute sinusitis, unspecified   . Allergy    Vasomotor / Environmental / Chemicals at work  . Anxiety   . Asthma    Dr. Barnetta Chapel  . Depression   . Hyperlipidemia   . Infective otitis externa, unspecified   . Internal thrombosed hemorrhoids   . Irritability   . Shortness of breath   . Special screening for malignant neoplasm of prostate     Current Outpatient Prescriptions  Medication Sig Dispense Refill  . albuterol (PROAIR HFA) 108 (90 BASE) MCG/ACT inhaler INHALE 2 PUFFS EVERY 6HRS AS NEEDED FOR COUGH AND WHEEZING 1 Inhaler 12  .  azelastine (OPTIVAR) 0.05 % ophthalmic solution Place 1 drop into both eyes as needed.      Marland Kitchen FLUoxetine (PROZAC) 10 MG capsule Take 1 capsule by mouth  daily 90 capsule 1  . fluticasone (FLONASE) 50 MCG/ACT nasal spray Use 2 sprays in each nostril daily 1 g 5  . HYDROcodone-acetaminophen (NORCO/VICODIN) 5-325 MG tablet Take 0.5-1 tablets by mouth at bedtime as needed (for shoulder pain). 20 tablet 0  . ibuprofen (ADVIL,MOTRIN) 200 MG tablet Take 200 mg by mouth every 6 (six) hours as needed.    . meclizine (ANTIVERT) 12.5 MG tablet Take 1-2 tablets (12.5-25 mg total) by mouth 3 (three) times daily as needed for dizziness. 30 tablet 3  . QVAR 80 MCG/ACT inhaler INHALE 1-2 PUFFS INTO THE LUNGS 2 (TWO) TIMES DAILY. 8.7 g 5  . methocarbamol (ROBAXIN) 750 MG tablet Take 1 tablet (750 mg total) by mouth every 8 (eight) hours as needed for muscle spasms. 30 tablet 0  . oxyCODONE-acetaminophen (PERCOCET/ROXICET) 5-325 MG tablet Take 1 tablet by mouth every 8 (eight) hours as needed for severe pain. 20 tablet 0   No current facility-administered medications for this visit.     Allergies  Allergen Reactions  . Advair Diskus [Fluticasone-Salmeterol]     Hoarse voice    Family History  Problem Relation Age  of Onset  . Alcohol abuse Father   . Diabetes Father   . Heart disease Father   . Heart disease Paternal Grandfather     CABG x 2  . Cancer Neg Hx   . Stroke Neg Hx     Social History   Social History  . Marital status: Married    Spouse name: N/A  . Number of children: 1  . Years of education: N/A   Occupational History  . Sports coach    [employer]   Social History Main Topics  . Smoking status: Former Smoker    Types: Cigarettes    Quit date: 01/25/2001  . Smokeless tobacco: Former Neurosurgeon    Quit date: 03/26/2000     Comment: quit over 10 years ago  . Alcohol use 0.0 oz/week     Comment: Minimal  . Drug use: No  . Sexual activity: Not on file   Other Topics  Concern  . Not on file   Social History Narrative   Acupuncturist, [employer] grad, Married 1993.     GI: Denies bowel incontinence. MSK: Pt reports lower back pain. Neuro: Denies numbness or tingling of the lower extremities or saddle anesthesia.  No other specific complaints in a complete review of systems (except as listed in HPI above).      Objective:   Physical Exam  BP (!) 148/86 (BP Location: Right Arm, Patient Position: Sitting, Cuff Size: Large)   Pulse 72   Temp 98.7 F (37.1 C) (Oral)   Wt 244 lb (110.7 kg)   BMI 31.76 kg/m   General: Appears to be in pain, in no acute distress MSK: Tender to palpation over bony prominences of lumbar spine, no tenderness in paraspinals.  Full lateral flexion of spine, limited extension, unable to assess flexion secondary to pain.  Strength 5/5 bilateral lower extremities, pain in lower back with knee extension. Neuro: Sensation to light touch intact bilateral lower extremities.      Assessment & Plan:   Midline low back pain without sciatica:  Likely lumbar strain  30 mg Toradol injection today Rx for Percocet 1 tablet q8hrs as needed for pain eRx for Robaxin 750 mg TID prn Stretching exercises given Continue ice, heat If pain persists or worsens, will Xray spine  RTC as needed or if symptoms persist or worsen BAITY, REGINA, NP

## 2015-10-24 NOTE — Patient Instructions (Signed)

## 2015-10-24 NOTE — Progress Notes (Signed)
Pre visit review using our clinic review tool, if applicable. No additional management support is needed unless otherwise documented below in the visit note. 

## 2015-10-25 NOTE — Telephone Encounter (Signed)
Letter completed and placed in front office for pick up yesterday---I called pt and lmovm

## 2015-10-31 ENCOUNTER — Telehealth: Payer: Self-pay

## 2015-10-31 ENCOUNTER — Ambulatory Visit (INDEPENDENT_AMBULATORY_CARE_PROVIDER_SITE_OTHER): Payer: 59 | Admitting: Internal Medicine

## 2015-10-31 ENCOUNTER — Encounter: Payer: Self-pay | Admitting: Internal Medicine

## 2015-10-31 ENCOUNTER — Ambulatory Visit (INDEPENDENT_AMBULATORY_CARE_PROVIDER_SITE_OTHER)
Admission: RE | Admit: 2015-10-31 | Discharge: 2015-10-31 | Disposition: A | Discharge status: Home / Self Care | Payer: 59 | Source: Ambulatory Visit | Attending: Internal Medicine | Admitting: Internal Medicine

## 2015-10-31 DIAGNOSIS — M545 Low back pain, unspecified: Secondary | ICD-10-CM

## 2015-10-31 MED ORDER — OXYCODONE-ACETAMINOPHEN 5-325 MG PO TABS: 1.0000 | ORAL_TABLET | Freq: Three times a day (TID) | ORAL | 0 refills | Status: DC | PRN

## 2015-10-31 MED ORDER — PREDNISONE 10 MG PO TABS: ORAL_TABLET | ORAL | 0 refills | Status: DC

## 2015-10-31 NOTE — Telephone Encounter (Signed)
I would try miralax, OTC.  1 dose per day prn. Thanks.

## 2015-10-31 NOTE — Telephone Encounter (Signed)
Patient advised.

## 2015-10-31 NOTE — Telephone Encounter (Signed)
Pt had an additional question that he forgot to ask you..... He wanted to know what he can do or take his constipation problem especially with the back pain--please advise

## 2015-10-31 NOTE — Progress Notes (Signed)
Pre visit review using our clinic review tool, if applicable. No additional management support is needed unless otherwise documented below in the visit note. 

## 2015-10-31 NOTE — Patient Instructions (Signed)

## 2015-10-31 NOTE — Progress Notes (Signed)
Subjective:    Patient ID: Dean Bishop, male    DOB: 1965/05/26, 50 y.o.   MRN: [id number]  HPI  Pt presents to the clinic today for a follow-up visit for back pain.  He was seen on 10/24/15 for back pain that began after bending to pick something up following an 11 hour drive from [place].  He was given a Toradol injection in the office, and prescribed Percocet and Robaxin.  Today he reports continued pain in his lower back.  He describes the pain in the center of his lower spine as a constant burning, and reports sharp pulling/aching radiating from the center of his back to both sides that is worse on the right side.  He rates the pain at a severity of 8/10 without medication and 3/10 with the medication.  He admits the pain is worse with walking, standing or sitting, and extension of the right leg.  He denies numbness or tingling in the lower extremities. He reports he is taking the Percocet and Robaxin every 8-10 hours for pain with some relief and some increased mobility, as well as Ibuprofen with relief.  He has tried heat which he states worsened the symptoms, and ice with some relief.  He denies loss of bowel or bladder.   Review of Systems   Past Medical History:  Diagnosis Date  . Acute sinusitis, unspecified   . Allergy    Vasomotor / Environmental / Chemicals at work  . Anxiety   . Asthma    Dr. Barnetta Chapel  . Depression   . Hyperlipidemia   . Infective otitis externa, unspecified   . Internal thrombosed hemorrhoids   . Irritability   . Shortness of breath   . Special screening for malignant neoplasm of prostate     Current Outpatient Prescriptions  Medication Sig Dispense Refill  . albuterol (PROAIR HFA) 108 (90 BASE) MCG/ACT inhaler INHALE 2 PUFFS EVERY 6HRS AS NEEDED FOR COUGH AND WHEEZING 1 Inhaler 12  . azelastine (OPTIVAR) 0.05 % ophthalmic solution Place 1 drop into both eyes as needed.      Marland Kitchen FLUoxetine (PROZAC) 10 MG capsule Take 1 capsule by mouth  daily 90 capsule  1  . fluticasone (FLONASE) 50 MCG/ACT nasal spray Use 2 sprays in each nostril daily 1 g 5  . ibuprofen (ADVIL,MOTRIN) 200 MG tablet Take 200 mg by mouth every 6 (six) hours as needed.    . meclizine (ANTIVERT) 12.5 MG tablet Take 1-2 tablets (12.5-25 mg total) by mouth 3 (three) times daily as needed for dizziness. 30 tablet 3  . methocarbamol (ROBAXIN) 750 MG tablet Take 1 tablet (750 mg total) by mouth every 8 (eight) hours as needed for muscle spasms. 30 tablet 0  . oxyCODONE-acetaminophen (PERCOCET/ROXICET) 5-325 MG tablet Take 1 tablet by mouth every 8 (eight) hours as needed for severe pain. 20 tablet 0  . QVAR 80 MCG/ACT inhaler INHALE 1-2 PUFFS INTO THE LUNGS 2 (TWO) TIMES DAILY. 8.7 g 5   No current facility-administered medications for this visit.     Allergies  Allergen Reactions  . Advair Diskus [Fluticasone-Salmeterol]     Hoarse voice    Family History  Problem Relation Age of Onset  . Alcohol abuse Father   . Diabetes Father   . Heart disease Father   . Heart disease Paternal Grandfather     CABG x 2  . Cancer Neg Hx   . Stroke Neg Hx     Social History  Social History  . Marital status: Married    Spouse name: N/A  . Number of children: 1  . Years of education: N/A   Occupational History  . Electrical Engineer [employer]   Social History Main Topics  . Smoking status: Former Smoker    Types: Cigarettes    Quit date: 01/25/2001  . Smokeless tobacco: Former NeurosurgeonUser    Quit date: 03/26/2000     Comment: quit over 10 years ago  . Alcohol use 0.0 oz/week     Comment: Minimal  . Drug use: No  . Sexual activity: Not on file   Other Topics Concern  . Not on file   Social History Narrative   Acupuncturistlectrical Engineer, [employer] grad, Married 1993.     MSK: Pt reports lower back pain. Neuro: Denies numbness or tingling of the lower extremities or saddle anesthesia.  No other specific complaints in a complete review of systems (except as listed in HPI  above).      Objective:   Physical Exam  BP (!) 142/88   Pulse 71   Temp 98.1 F (36.7 C) (Oral)   Wt 240 lb (108.9 kg)   SpO2 97%   BMI 31.23 kg/m   General: Appears to be in pain, in no acute distress Skin: No rashes noted. MSK:  Cervical spine nontender to palpation, full AROM and strength, pain in lower back with cervical flexion and extension.  Tender to palpation over bony prominences of lumbar spine and bilateral paraspinals.  Limited flexion of lumbar spine, unable to assess lateral flexion, rotation, or extension secondary to pain.  Strength 5/5 bilateral lower extremities.  Negative SLR bilaterally. Neuro: Sensation to light touch intact bilateral lower extremities.      Assessment & Plan:   Midline low back pain without sciatica:  eRx for Prednisone taper Refill Rx for Percocet 1 tablet q8hrs as needed for pain Continue Robaxin, ice Xray of spine today May need referral to PT vs orthopedics based on xray results  RTC as needed or if symptoms persist or worsen Nicki ReaperBAITY, REGINA, NP

## 2015-11-03 ENCOUNTER — Telehealth: Payer: Self-pay | Admitting: Family Medicine

## 2015-11-03 ENCOUNTER — Other Ambulatory Visit: Payer: Self-pay | Admitting: Internal Medicine

## 2015-11-03 DIAGNOSIS — M545 Low back pain, unspecified: Secondary | ICD-10-CM

## 2015-11-03 NOTE — Telephone Encounter (Signed)
I have placed the referral

## 2015-11-03 NOTE — Telephone Encounter (Signed)
W[facility] called - [facility] Please call [phone] to set up  Thanks

## 2015-11-04 ENCOUNTER — Telehealth: Payer: Self-pay | Admitting: Internal Medicine

## 2015-11-04 NOTE — Telephone Encounter (Signed)
He can try a Fleets enema and then start Mirilax daily while on pain meds

## 2015-11-04 NOTE — Telephone Encounter (Signed)
Pt is aware as instructed 

## 2015-11-04 NOTE — Telephone Encounter (Signed)
Called the patient to make Ortho appointment. Appt scheduled with Dr Yevette Edwardsumonski for 11/14/15. Patient says he hasnt had a bowel movement in about 1 week. He is taking the ducolax, just wanted you to be aware.

## 2015-11-07 ENCOUNTER — Telehealth: Payer: Self-pay

## 2015-11-07 MED ORDER — MECLIZINE HCL 12.5 MG PO TABS: 12.5000 mg | ORAL_TABLET | Freq: Three times a day (TID) | ORAL | 3 refills | Status: DC | PRN

## 2015-11-07 NOTE — Telephone Encounter (Signed)
Cristy (DPR signed) left v/m; pt seen 10/31/15 for back pain; now pt has vertigo and request refill on meclizine to [facility] [place].Please advise. Last refilled # 30 x 3 on 09/17/11.

## 2015-11-07 NOTE — Telephone Encounter (Signed)
Sent.  Thanks.  F/u prn.  

## 2015-11-11 ENCOUNTER — Other Ambulatory Visit: Payer: Self-pay | Admitting: Orthopedic Surgery

## 2015-11-11 DIAGNOSIS — M5416 Radiculopathy, lumbar region: Secondary | ICD-10-CM

## 2015-11-24 ENCOUNTER — Ambulatory Visit: Payer: 59

## 2015-11-26 ENCOUNTER — Ambulatory Visit
Admission: RE | Admit: 2015-11-26 | Discharge: 2015-11-26 | Disposition: A | Discharge status: Home / Self Care | Payer: 59 | Source: Ambulatory Visit | Attending: Orthopedic Surgery | Admitting: Orthopedic Surgery

## 2015-11-26 DIAGNOSIS — M4806 Spinal stenosis, lumbar region: Secondary | ICD-10-CM | POA: Diagnosis not present

## 2015-11-26 DIAGNOSIS — M5126 Other intervertebral disc displacement, lumbar region: Secondary | ICD-10-CM | POA: Diagnosis not present

## 2015-11-26 DIAGNOSIS — M5416 Radiculopathy, lumbar region: Secondary | ICD-10-CM | POA: Diagnosis present

## 2015-11-26 DIAGNOSIS — M5127 Other intervertebral disc displacement, lumbosacral region: Secondary | ICD-10-CM | POA: Diagnosis not present

## 2015-11-26 DIAGNOSIS — M47816 Spondylosis without myelopathy or radiculopathy, lumbar region: Secondary | ICD-10-CM | POA: Insufficient documentation

## 2016-01-04 ENCOUNTER — Ambulatory Visit (INDEPENDENT_AMBULATORY_CARE_PROVIDER_SITE_OTHER): Payer: 59 | Admitting: Family Medicine

## 2016-01-05 ENCOUNTER — Ambulatory Visit (INDEPENDENT_AMBULATORY_CARE_PROVIDER_SITE_OTHER): Payer: 59 | Admitting: Family Medicine

## 2016-01-05 ENCOUNTER — Encounter: Payer: Self-pay | Admitting: Family Medicine

## 2016-01-05 VITALS — BP 130/102 | HR 68 | Temp 98.4°F | Ht 73.5 in | Wt 245.2 lb

## 2016-01-05 DIAGNOSIS — G8929 Other chronic pain: Secondary | ICD-10-CM

## 2016-01-05 DIAGNOSIS — M25511 Pain in right shoulder: Secondary | ICD-10-CM | POA: Diagnosis not present

## 2016-01-05 NOTE — Progress Notes (Signed)
Pre visit review using our clinic review tool, if applicable. No additional management support is needed unless otherwise documented below in the visit note. 

## 2016-01-05 NOTE — Progress Notes (Signed)
Dr. Karleen HampshireSpencer T. Dimitra Woodstock, MD, CAQ Sports Medicine [facility] Sports [facility] [address] [place], [id number] Phone: [phone] Fax: [phone]  01/05/2016  Patient: Dean Bishop, MRN: [id number], DOB: 18-Nov-1965, 50 y.o.  Primary Physician:  Crawford GivensGraham Duncan, MD   Chief Complaint  Patient presents with  . Shoulder Pain    right   Subjective:   Dean Bishop is a 50 y.o. very pleasant male patient who presents with the following:  The patient has a 30 year history of right-sided shoulder pain.  He is right-hand dominant.  He "blew out his shoulder " on the right when he was playing racquetball in college.  He reports that he was unable to use his shoulder effectively at all for approximately one year at that time.  Since that time he has been unable to throw a football, baseball, softball, and is had  Significant difficulty using his right-sided upper extremity.  At this point, he is fairly limited even getting dressed and doing things around the house along with playing with smaller children.  He has no known dislocation.  No known fracture.  At the time of his initial injury, he did not have any formal medical evaluation.  Felt like exploded shoulder a couple of months ago. Then injured his back. Feels like significantly limited range of motion.  He was using a splice water at that time, and since then he has had dramatic pain in his shoulder.  Difficulty with flexion, abduction, and he has markedly abnormal mechanics.  Past Medical History, Surgical History, Social History, Family History, Problem List, Medications, and Allergies have been reviewed and updated if relevant.  Patient Active Problem List   Diagnosis Date Noted  . Pain in joint, shoulder region 10/10/2015  . ETD (eustachian tube dysfunction) 03/08/2014  . Asthma 05/05/2013  . Vertigo 12/16/2011  . 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 Elevated BP 01/18/2011  . IRRITABILITY 11/04/2009  .  HEMORRHOIDS, INTERNAL, THROMBOSED 06/27/2009  . HYPERLIPIDEMIA 05/04/2009  . DEPRESSION/ANXIETY 08/13/2007  . ALLERGY, ENVIRONMENTAL 08/13/2007    Past Medical History:  Diagnosis Date  . Acute sinusitis, unspecified   . Allergy    Vasomotor / Environmental / Chemicals at work  . Anxiety   . Asthma    Dr. Barnetta ChapelWhelan  . Depression   . Hyperlipidemia   . Infective otitis externa, unspecified   . Internal thrombosed hemorrhoids   . Irritability   . Shortness of breath   . Special screening for malignant neoplasm of prostate     No past surgical history on file.  Social History   Social History  . Marital status: Married    Spouse name: N/A  . Number of children: 1  . Years of education: N/A   Occupational History  . 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 Smoking status: Former Smoker    Types: Cigarettes    Quit date: 01/25/2001  . Smokeless tobacco: Former NeurosurgeonUser    Quit date: 03/26/2000     Comment: quit over 10 years ago  . Alcohol use 0.0 oz/week     Comment: Minimal  . Drug use: No  . Sexual activity: Not on file   Other Topics Concern  . Not on file   Social History Narrative   Acupuncturistlectrical Engineer, [employer] grad, Married 1993.    Family History  Problem Relation Age of Onset  . Alcohol abuse Father   . Diabetes  Father   . Heart disease Father   . Heart disease Paternal Grandfather     CABG x 2  . Cancer Neg Hx   . Stroke Neg Hx     Allergies  Allergen Reactions  . Advair Diskus [Fluticasone-Salmeterol]     Hoarse voice    Medication list reviewed and updated in full in [facility].  GEN: No fevers, chills. Nontoxic. Primarily MSK c/o today. MSK: Detailed in the HPI GI: tolerating PO intake without difficulty Neuro: No numbness, parasthesias, or tingling associated. Otherwise the pertinent positives of the ROS are noted above.   Objective:   BP (!) 130/102   Pulse 68   Temp 98.4 F (36.9 C) (Oral)   Ht  6' 1.5" (1.867 m)   Wt 245 lb 4 oz (111.2 kg)   SpO2 95%   BMI 31.92 kg/m    GEN: WDWN, NAD, Non-toxic, Alert & Oriented x 3 HEENT: Atraumatic, Normocephalic.  Ears and Nose: No external deformity. EXTR: No clubbing/cyanosis/edema NEURO: Normal gait.  PSYCH: Normally interactive. Conversant. Not depressed or anxious appearing.  Calm demeanor.   Shoulder: R Inspection: Mild muscle wasting on the R UE and in the scapular region Ecchymosis/edema: neg  AC joint, scapula, clavicle: mild ttp at ac joint Cervical spine: NT, full ROM Abduction: full, 4/5 - dramatically altered scapular mechanics during abduction. Flexion: full, 4/5 - again with altered mechanics. IR, full, lift-off: 5/5 ER at neutral: full, 5/5 AC crossover and compression: pos Neer: pos Hawkins: pos Drop Test: neg Empty Can: pos Supraspinatus insertion: ttp Bicipital groove: ttp Sulcus sign: neg Apprehension: POS O'Brien's: POS Jobe Relocation: neg Crank: POS Load and shift laxity: heavily guarding Scapular dyskinesis: markedly altered scapular dyskinesis with abduction and flexion,  Filmed for the patient and shown face-to-face.  Marketed elevation of the hemi-scapula in abduction.    Radiology: CLINICAL DATA:  Right shoulder pain  EXAM: RIGHT SHOULDER - 2+ VIEW  COMPARISON:  None.  FINDINGS: There is no evidence of fracture or dislocation. There is no evidence of arthropathy or other focal bone abnormality. Soft tissues are unremarkable.  IMPRESSION: Negative.   Electronically Signed   By: Paulina Fusi M.D.   On: 10/11/2015 07:53  Assessment and Plan:   Chronic right shoulder pain - Plan: MR Shoulder Right W Contrast, DG FLUORO GUIDED NEEDLE PLC ASPIRATION/INJECTION LOC  >25 minutes spent in face to face time with patient, >50% spent in counselling or coordination of care   Markedly abnormal shoulder examination  In a patient with a 30 year history of impaired shoulder function.    Physical examination is decidedly abnormal almost in its entirety.  Obtain an MR arthrogram of the right shoulder to evaluate for clinical suspicion of large-scale labral tear, cannot exclude rotator cuff tear,  Acute or chronic, capsular disruption is also distinctly possible.  Evaluate the glenoid.  Anticipate significant internal derangement of the right shoulder, and consultation with shoulder surgeon.  For many years, the patient has failed conservative management.  He has had multiple years of physical therapy, along with  Years and years of oral  Anti-inflammatories and multiple injections of corticosteroid  By other physicians.  Follow-up: depending on MR Arthrogram  Orders Placed This Encounter  Procedures  . MR Shoulder Right W Contrast  . DG FLUORO GUIDED NEEDLE PLC ASPIRATION/INJECTION LOC    Signed,  Keeva Reisen T. Avel Ogawa, MD   Patient's Medications  New Prescriptions   No medications on file  Previous Medications  ALBUTEROL (PROAIR HFA) 108 (90 BASE) MCG/ACT INHALER    INHALE 2 PUFFS EVERY 6HRS AS NEEDED FOR COUGH AND WHEEZING   AZELASTINE (OPTIVAR) 0.05 % OPHTHALMIC SOLUTION    Place 1 drop into both eyes as needed.     FLUOXETINE (PROZAC) 10 MG CAPSULE    Take 1 capsule by mouth  daily   FLUTICASONE (FLONASE) 50 MCG/ACT NASAL SPRAY    Use 2 sprays in each nostril daily   IBUPROFEN (ADVIL,MOTRIN) 200 MG TABLET    Take 200 mg by mouth every 6 (six) hours as needed.   MECLIZINE (ANTIVERT) 12.5 MG TABLET    Take 1-2 tablets (12.5-25 mg total) by mouth 3 (three) times daily as needed for dizziness.   METHOCARBAMOL (ROBAXIN) 750 MG TABLET    Take 1 tablet (750 mg total) by mouth every 8 (eight) hours as needed for muscle spasms.   OXYCODONE-ACETAMINOPHEN (PERCOCET/ROXICET) 5-325 MG TABLET    Take 1 tablet by mouth every 8 (eight) hours as needed for severe pain.   PREDNISONE (DELTASONE) 10 MG TABLET    Take 3 tabs on days 1-3, take 2 tabs on days 4-6, take 1 tab on days 7-9    QVAR 80 MCG/ACT INHALER    INHALE 1-2 PUFFS INTO THE LUNGS 2 (TWO) TIMES DAILY.  Modified Medications   No medications on file  Discontinued Medications   No medications on file

## 2016-01-20 ENCOUNTER — Ambulatory Visit: Admission: RE | Admit: 2016-01-20 | Payer: 59 | Source: Ambulatory Visit

## 2016-01-20 ENCOUNTER — Ambulatory Visit: Payer: 59

## 2016-01-30 ENCOUNTER — Telehealth: Payer: Self-pay

## 2016-01-30 ENCOUNTER — Other Ambulatory Visit: Payer: Self-pay | Admitting: Family Medicine

## 2016-01-30 ENCOUNTER — Ambulatory Visit
Admission: RE | Admit: 2016-01-30 | Discharge: 2016-01-30 | Disposition: A | Discharge status: Home / Self Care | Payer: 59 | Source: Ambulatory Visit | Attending: Family Medicine | Admitting: Family Medicine

## 2016-01-30 DIAGNOSIS — M19011 Primary osteoarthritis, right shoulder: Secondary | ICD-10-CM | POA: Insufficient documentation

## 2016-01-30 DIAGNOSIS — M25511 Pain in right shoulder: Principal | ICD-10-CM

## 2016-01-30 DIAGNOSIS — M7551 Bursitis of right shoulder: Secondary | ICD-10-CM | POA: Insufficient documentation

## 2016-01-30 DIAGNOSIS — S43431A Superior glenoid labrum lesion of right shoulder, initial encounter: Secondary | ICD-10-CM | POA: Diagnosis not present

## 2016-01-30 DIAGNOSIS — G8929 Other chronic pain: Secondary | ICD-10-CM | POA: Diagnosis present

## 2016-01-30 DIAGNOSIS — X58XXXA Exposure to other specified factors, initial encounter: Secondary | ICD-10-CM | POA: Diagnosis not present

## 2016-01-30 MED ORDER — IOPAMIDOL (ISOVUE-300) INJECTION 61%
10.0000 mL | Freq: Once | INTRAVENOUS | Status: AC | PRN
  Administered 2016-01-30: 7 mL via INTRA_ARTERIAL

## 2016-01-30 MED ORDER — SODIUM CHLORIDE 0.9 % IJ SOLN
10.0000 mL | INTRAMUSCULAR | Status: DC | PRN
  Administered 2016-01-30: 10 mL
  Filled 2016-01-30: qty 10

## 2016-01-30 MED ORDER — LIDOCAINE HCL (PF) 1 % IJ SOLN
10.0000 mL | Freq: Once | INTRAMUSCULAR | Status: DC
  Filled 2016-01-30: qty 10

## 2016-01-30 MED ORDER — GADOBENATE DIMEGLUMINE 529 MG/ML IV SOLN
0.1000 mL | Freq: Once | INTRAVENOUS | Status: AC | PRN
  Administered 2016-01-30: 0.1 mL via INTRA_ARTICULAR

## 2016-01-30 NOTE — Telephone Encounter (Signed)
Lindsay left v/m had question about order pt was to have exam done at 9 AM. I called back and [facility] said already taken care of;nothing further needed.

## 2016-02-03 ENCOUNTER — Other Ambulatory Visit: Payer: Self-pay | Admitting: Family Medicine

## 2016-02-03 DIAGNOSIS — S46111A Strain of muscle, fascia and tendon of long head of biceps, right arm, initial encounter: Secondary | ICD-10-CM

## 2016-02-03 DIAGNOSIS — S46219A Strain of muscle, fascia and tendon of other parts of biceps, unspecified arm, initial encounter: Secondary | ICD-10-CM

## 2016-02-03 DIAGNOSIS — S43431A Superior glenoid labrum lesion of right shoulder, initial encounter: Secondary | ICD-10-CM

## 2016-02-03 DIAGNOSIS — M7581 Other shoulder lesions, right shoulder: Secondary | ICD-10-CM

## 2016-02-03 NOTE — Progress Notes (Signed)
Dg Arthro Shoulder Right  Result Date: 01/30/2016 INDICATION: Chronic right shoulder pain. Injury in college 30 years ago with re-injury 4 months ago. EXAM: ARTHROCENTESIS OF right shoulder JOINT UNDER FLUOROSCOPIC GUIDANCE prior to MRI COMPARISON:  Right shoulder series of October 10, 2015 FLUOROSCOPY TIME:  Fluoroscopy Time:  0 minutes, 24 seconds Radiation Exposure Index (if provided by the fluoroscopic device): 70.03 micro Gy per meters squared Number of Acquired Spot Images: 1 COMPLICATIONS: None immediate. PROCEDURE: Informed written consent was obtained from the patient after discussion of the risks, benefits and alternatives to treatment. The patient was placed supine on the fluoroscopy table and the right shoulder was placed in external rotation. The right shoulder joint was localized with fluoroscopy. The skin overlying the anterior aspect of the right shoulder was prepped and draped in usual sterile fashion. A 22 gauge spinal needle was advanced into the anterior medial aspect of the right shoulder joint, after the overlying soft tissues were anesthetized with 1% lidocaine. A fluoroscopic image was saved and sent to PACs. A test injection was made with a solution of Isovue-300 and normal saline to confirm intra-articular positioning of the needle. Subsequently 12 cc of a solution of 0.1 cc of MultiHance, 6 cc of 1% lidocaine, and 6 cc of normal saline was instilled into the right shoulder joint. The needle was removed and a dressing was placed. The patient tolerated procedure well without immediate postprocedural complication. The patient was taken to the MRI suite in good condition. IMPRESSION: Successful fluoroscopic guided injection of the right shoulder joint prior to MRI. Electronically Signed   By: David  SwazilandJordan M.D.   On: 01/30/2016 11:56   Mr Shoulder Right W Contrast  Result Date: 01/30/2016 CLINICAL DATA:  Right shoulder pain and limited range of motion for 5 months since an injury while  performing a swinging motion. EXAM: MR ARTHROGRAM OF THE RIGHT SHOULDER TECHNIQUE: Multiplanar, multisequence MR imaging of the right shoulder was performed following the administration of intra-articular contrast. CONTRAST:  See Injection Documentation. COMPARISON:  None. FINDINGS: Rotator cuff: Mildly increased T2 signal in the supraspinatus and infraspinatus tendons consistent with tendinopathy is identified. No tear. Muscles: No atrophy or focal lesion. Biceps long head: Longitudinal split tearing of the tendon at and just distal to the labrum is identified. Acromioclavicular Joint: Mild degenerative disease is seen. Glenohumeral Joint: Unremarkable. Labrum: There is a tear of the superior labrum extending from the 1 o'clock position anteriorly to the 10 o'clock position posteriorly. A paralabral cyst measuring 3.5 cm transverse by 1.2 cm AP by 1.4 cm craniocaudal is seen at the 10 o'clock position of the glenoid. Bones: No fracture or worrisome lesion. The acromion is type 1. Tiny amount of fluid is seen in the subacromial/subdeltoid bursa. There is no contrast in the bursa. IMPRESSION: Large SLAP tear extends into the intra-articular long head of biceps tendon consistent with a type 4 injury. Associated large paralabral cyst at the 10 o'clock position is identified. Mild appearing supraspinatus and infraspinatus tendinopathy without tear. Mild acromioclavicular osteoarthritis. Small volume of subacromial/subdeltoid fluid consistent with bursitis. Electronically Signed   By: Drusilla Kannerhomas  Dalessio M.D.   On: 01/30/2016 16:15

## 2016-02-21 IMAGING — MR MRI THORACIC SPINE WITHOUT CONTRAST
4 of 6 series · 25 of 48 positions shown · non-contrast
Comparison: Thoracic spine radiographs 07/06/2014. Cervical MRI
from today reported separately. Break L plexus MRI 01/28/2011.

CLINICAL DATA: 49 year old male with pain and burning extending
from the central neck into the upper back. Left upper extremity pain
and muscle twitching in the right upper extremity. Symptoms for 2
years, progressing x2 months. Parsonage Turner syndrome. Subsequent
encounter.

EXAM:
MRI THORACIC SPINE WITHOUT CONTRAST
TECHNIQUE: Multiplanar, multisequence MR imaging of the thoracic spine was
performed. No intravenous contrast was administered.

[Series 5: T2 · sagittal · 3.0mm · 0.91mm/px · 6 of 18 slices shown (1 of 2)]
[im 1/18]
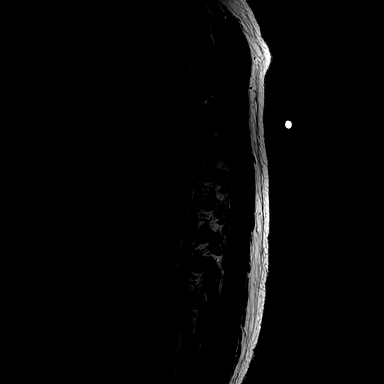
[im 4/18]
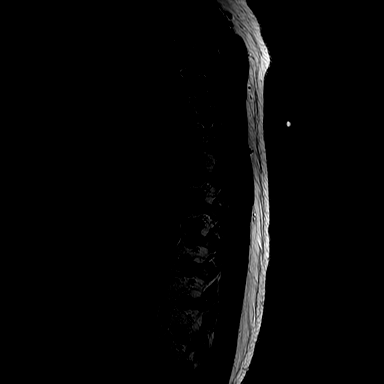
[im 7/18]
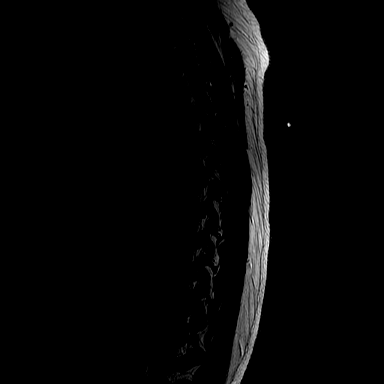
[im 11/18]
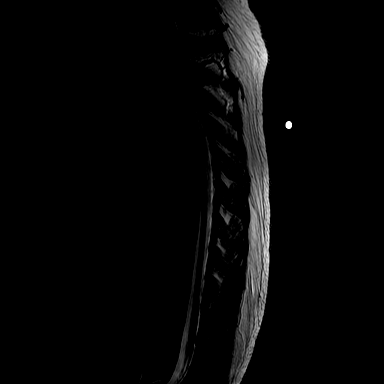
[im 14/18]
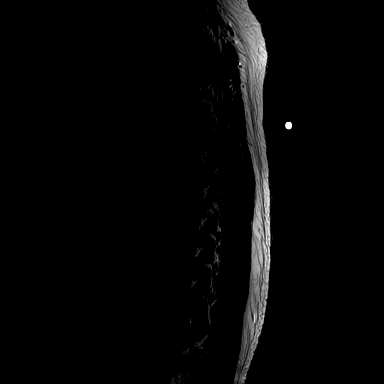
[im 18/18]
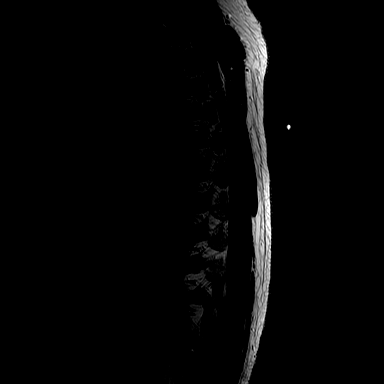

[Series 6: T1 · sagittal · 3.0mm · 0.68mm/px · 6 of 18 slices shown]
[im 1/18]
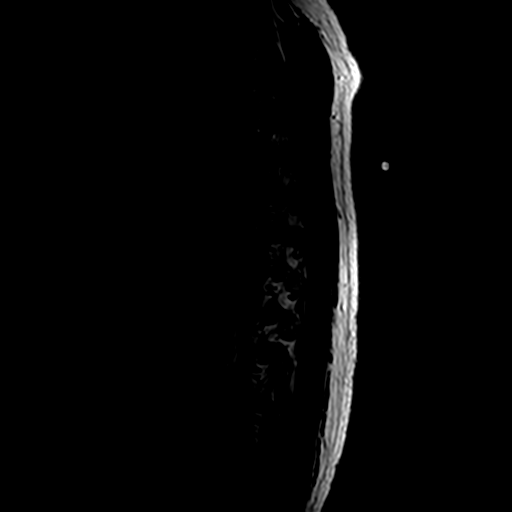
[im 4/18]
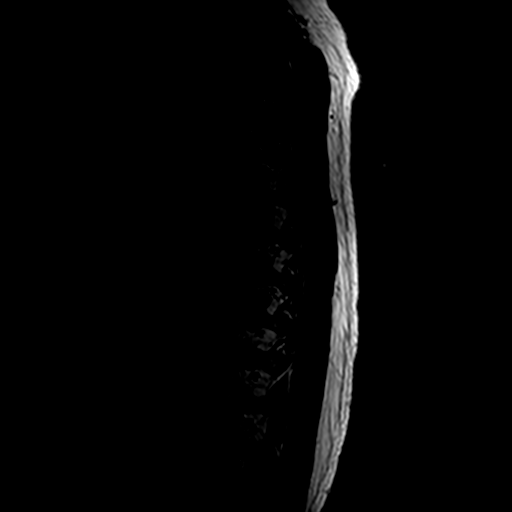
[im 7/18]
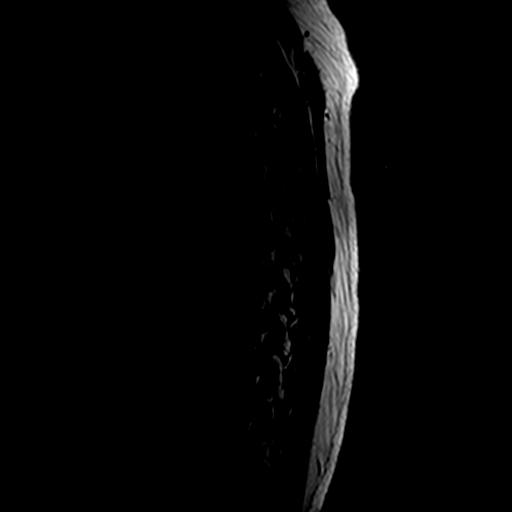
[im 11/18]
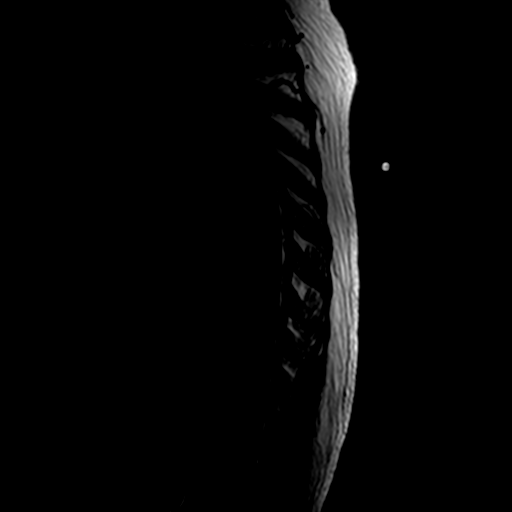
[im 14/18]
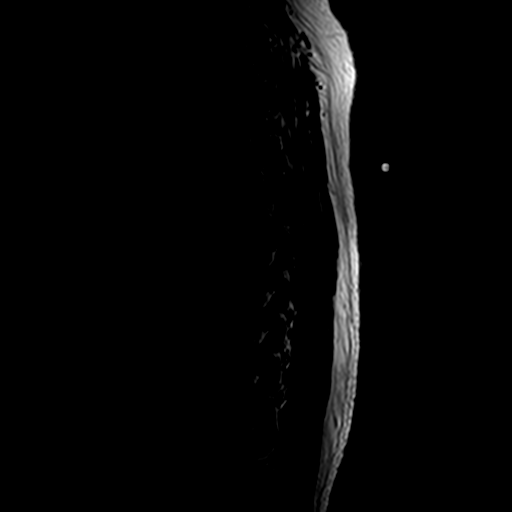
[im 18/18]
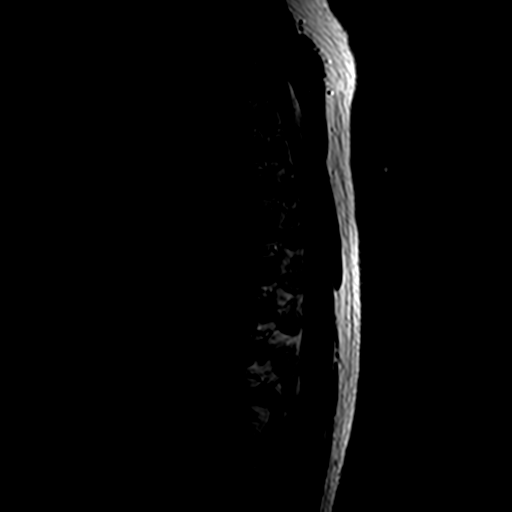

[Series 7: STIR · sagittal · 3.0mm · 0.68mm/px · 4 of 18 slices shown]
[im 1/18]
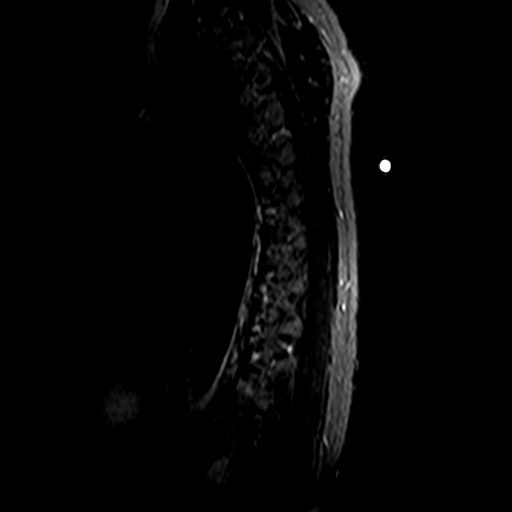
[im 4/18]
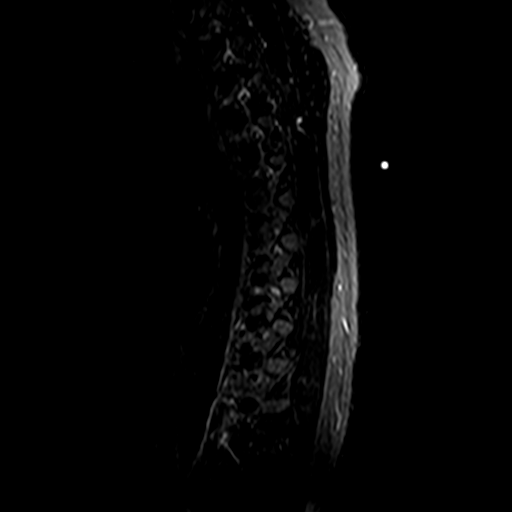
[im 11/18]
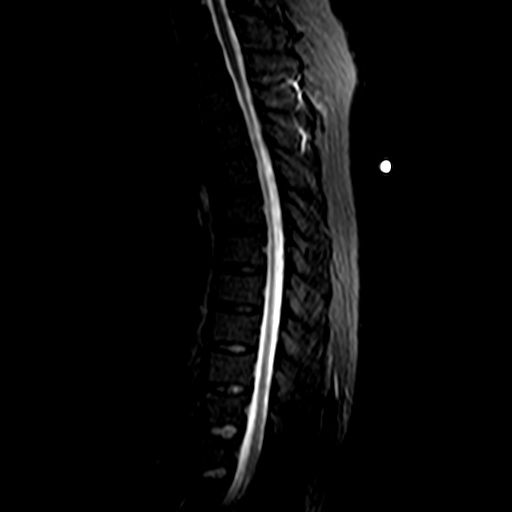
[im 18/18]
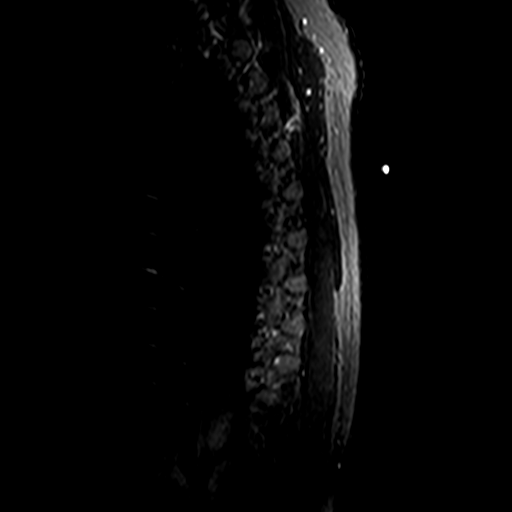

[Series 8: T2 · axial · 5.0mm · 0.57mm/px · z∈[-214,+75]mm · 9 of 39 slices shown (2 of 2)]
[im 1/39]
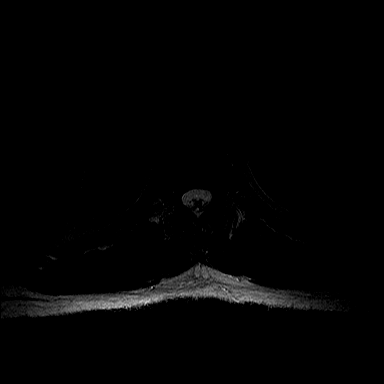
[im 7/39]
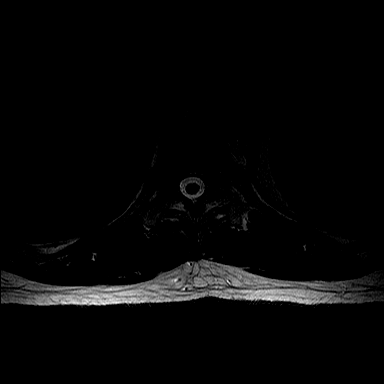
[im 13/39]
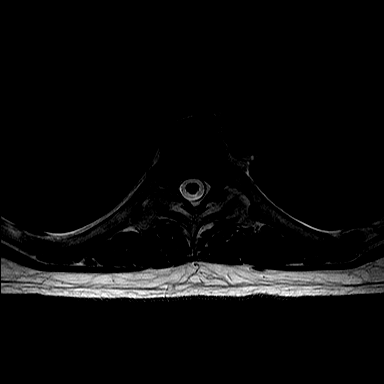
[im 16/39]
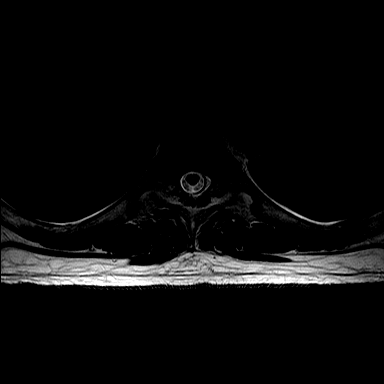
[im 20/39]
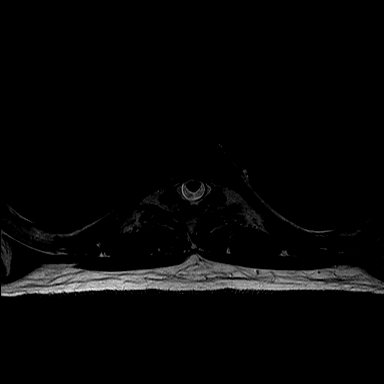
[im 23/39]
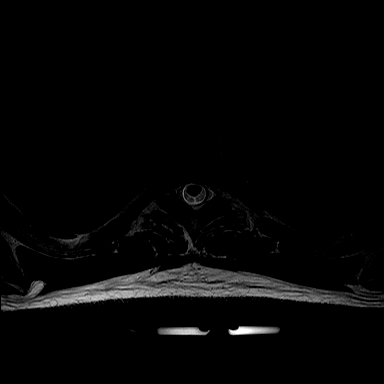
[im 26/39]
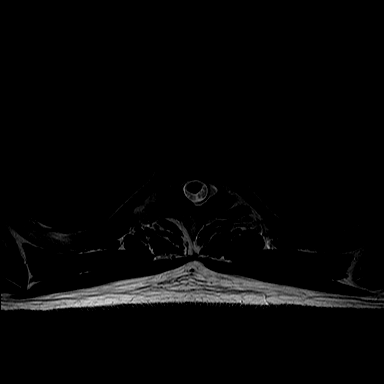
[im 32/39]
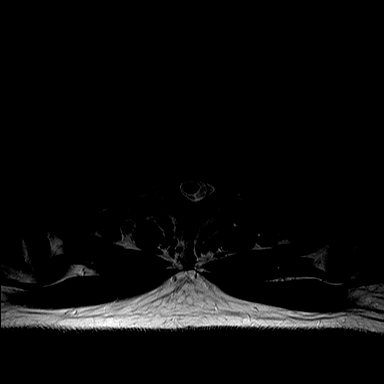
[im 39/39]
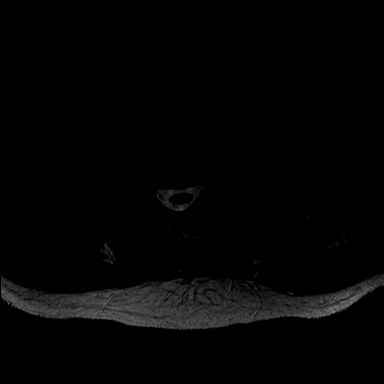

[25 of 48 positions shown; findings below may reference images not displayed]

FINDINGS: Cervical spine findings today are reported separately.

There is mild levo convex thoracic scoliosis. Otherwise normal
thoracic vertebral height and alignment. Small benign hemangioma
suspected in the right T8 lamina. Similar benign hemangioma in the
inferior T9 vertebral body. No marrow edema or evidence of acute
osseous abnormality.

Spinal cord signal is within normal limits at all visualized levels.
The conus medullaris appears normal at T12-L1.

Negative visualized thoracic and upper abdominal viscera.

T1-T2: Mild uncovertebral hypertrophy. Mild right T1 foraminal
stenosis.

T2-T3: Mild right uncovertebral and facet hypertrophy. Mild right T2
foraminal stenosis.

T3-T4: Mild facet hypertrophy.  No stenosis.

T4-T5: Mild facet hypertrophy.  No stenosis.

T5-T6: Borderline to mild facet hypertrophy.  No stenosis.

T6-T7: Negative.

T7-T8: Negative.

T8-T9: Negative.

T9-T10: Negative.

T10-T11: Mild facet hypertrophy.  No stenosis.

T11-T12: Mild facet hypertrophy greater on the right.  No stenosis.

T12-L1:  Negative.
IMPRESSION: 1. Mild scoliosis with mostly upper thoracic facet hypertrophy
associated with mild right T1 and T2 foraminal stenosis.
2. Otherwise negative for age thoracic spine. No thoracic spinal
stenosis peer

## 2016-04-13 ENCOUNTER — Encounter: Payer: Self-pay | Admitting: Family Medicine

## 2016-04-13 ENCOUNTER — Ambulatory Visit (INDEPENDENT_AMBULATORY_CARE_PROVIDER_SITE_OTHER): Payer: 59 | Admitting: Family Medicine

## 2016-04-13 VITALS — BP 144/100 | HR 86 | Temp 98.3°F | Ht 73.5 in | Wt 253.0 lb

## 2016-04-13 DIAGNOSIS — J020 Streptococcal pharyngitis: Secondary | ICD-10-CM | POA: Diagnosis not present

## 2016-04-13 DIAGNOSIS — I10 Essential (primary) hypertension: Secondary | ICD-10-CM | POA: Diagnosis not present

## 2016-04-13 LAB — POCT RAPID STREP A (OFFICE): Rapid Strep A Screen: POSITIVE — AB

## 2016-04-13 MED ORDER — AMOXICILLIN 500 MG PO CAPS: 1000.0000 mg | ORAL_CAPSULE | Freq: Two times a day (BID) | ORAL | 0 refills | Status: DC

## 2016-04-13 NOTE — Patient Instructions (Addendum)
Stop decongestant.. Do not use now or in future.  Get blood pressure cuff and follow BP at home.. Goal < 140/90.  Call if BP is consisently.  Rest fluids and complete antibiotics.  Remain out of work for 24 hours after starting antibiotics.

## 2016-04-13 NOTE — Progress Notes (Signed)
Subjective:    Patient ID: Dean Bishop, male    DOB: 1966/03/26, 51 y.o.   MRN: [id number]  HPI 51 year male pt of Dr. Lianne Bushyuncan's presents with 1.5 weeks of  Severe sore throat. Hard to talk and swallow. Bilateral sore.  Some post nasal drip. Last week had sweats and chills, no measured fever. Right ear pain in last few days, no face pain.  Minimal cough, no SOB, no wheeze.  [place] last  few weeks.   Has been using OTC throat lozenges.  Using robitussin. Did take decongestant yesterday, sudafed.  No sick contacts of strep throat. He has had elevated BP in past but no Dx of hypertension. She is not on BP medications.  BP Readings from Last 3 Encounters:  04/13/16 (!) 144/100  01/05/16 (!) 130/102  10/31/15 (!) 142/88   His BP has been high for several years... 130/90s well and sick.  Strong family history of HTN.    Wt Readings from Last 3 Encounters:  04/13/16 253 lb (114.8 kg)  01/05/16 245 lb 4 oz (111.2 kg)  10/31/15 240 lb (108.9 kg)     Hx of asthma   Review of Systems  Constitutional: Positive for fatigue and fever.  HENT: Positive for ear pain and postnasal drip. Negative for sinus pain and sinus pressure.   Eyes: Negative for pain.  Respiratory: Negative for chest tightness, shortness of breath and wheezing.   Cardiovascular: Negative for chest pain and leg swelling.       Objective:   Physical Exam  Constitutional: Vital signs are normal. He appears well-developed and well-nourished.  Non-toxic appearance. He does not appear ill. No distress.  HENT:  Head: Normocephalic and atraumatic.  Right Ear: Hearing, tympanic membrane, external ear and ear canal normal. No tenderness. No foreign bodies. Tympanic membrane is not retracted and not bulging. No middle ear effusion.  Left Ear: Hearing, tympanic membrane, external ear and ear canal normal. No tenderness. No foreign bodies. Tympanic membrane is not retracted and not bulging.  No  middle ear effusion.  Nose: Nose normal. No mucosal edema or rhinorrhea. Right sinus exhibits no maxillary sinus tenderness and no frontal sinus tenderness. Left sinus exhibits no maxillary sinus tenderness and no frontal sinus tenderness.  Mouth/Throat: Uvula is midline and mucous membranes are normal. Normal dentition. No dental caries. Posterior oropharyngeal erythema present. No oropharyngeal exudate, posterior oropharyngeal edema or tonsillar abscesses.  Post nasal drip  Eyes: Conjunctivae, EOM and lids are normal. Pupils are equal, round, and reactive to light. Lids are everted and swept, no foreign bodies found.  Neck: Trachea normal, normal range of motion and phonation normal. Neck supple. Carotid bruit is not present. No thyroid mass and no thyromegaly present.  Cardiovascular: Normal rate, regular rhythm, S1 normal, S2 normal, normal heart sounds, intact distal pulses and normal pulses.  Exam reveals no gallop.   No murmur heard. Pulmonary/Chest: Effort normal and breath sounds normal. No respiratory distress. He has no wheezes. He has no rhonchi. He has no rales.  Abdominal: Soft. Normal appearance and bowel sounds are normal. There is no hepatosplenomegaly. There is no tenderness. There is no rebound, no guarding and no CVA tenderness. No hernia.  Neurological: He is alert. He has normal reflexes.  Skin: Skin is warm, dry and intact. No rash noted.  Psychiatric: He has a normal mood and affect. His speech is normal and behavior is normal. Judgment normal.  Assessment & Plan:

## 2016-04-13 NOTE — Assessment & Plan Note (Signed)
Pt has consistently at well and sick visits had elevated BP. He has strong family history of HTN.  Likely todayt it is worsened with decongestant.. Stop . Recommended start of BP med to treat.. Pt refused as going out of town in 3 days.  I insisted pt return to see PCP in 2-3 weeks upon return for BP  eval and treatment. Will follow BP and call if remaining dangerously high off decongestant. Encouraged exercise, weight loss, healthy eating habits.

## 2016-04-13 NOTE — Progress Notes (Signed)
Pre visit review using our clinic review tool, if applicable. No additional management support is needed unless otherwise documented below in the visit note. 

## 2016-04-13 NOTE — Assessment & Plan Note (Signed)
Positive strep test.  Treat with ibuprofen and 10 day amox course.

## 2016-04-14 ENCOUNTER — Other Ambulatory Visit: Payer: Self-pay | Admitting: Family Medicine

## 2016-05-28 ENCOUNTER — Encounter: Payer: Self-pay | Admitting: Family Medicine

## 2016-05-28 ENCOUNTER — Encounter (INDEPENDENT_AMBULATORY_CARE_PROVIDER_SITE_OTHER): Payer: Self-pay

## 2016-05-28 ENCOUNTER — Ambulatory Visit (INDEPENDENT_AMBULATORY_CARE_PROVIDER_SITE_OTHER): Payer: 59 | Admitting: Family Medicine

## 2016-05-28 VITALS — BP 144/90 | HR 72 | Temp 98.3°F | Ht 73.5 in | Wt 254.0 lb

## 2016-05-28 DIAGNOSIS — J029 Acute pharyngitis, unspecified: Secondary | ICD-10-CM

## 2016-05-28 DIAGNOSIS — J020 Streptococcal pharyngitis: Secondary | ICD-10-CM

## 2016-05-28 LAB — POCT RAPID STREP A (OFFICE): RAPID STREP A SCREEN: POSITIVE — AB

## 2016-05-28 MED ORDER — AMOXICILLIN 500 MG PO CAPS: 500.0000 mg | ORAL_CAPSULE | Freq: Three times a day (TID) | ORAL | 0 refills | Status: DC

## 2016-05-28 NOTE — Patient Instructions (Signed)
You have a positive strep test Take the amoxicillin as directed for a week  Drink lots of fluids Gargle with salt water  For sore throat try acetaminophen For respiratory symptoms (? Allergies vs a cold) - zyrtec 10 mg once daily for runny nose and drip is good to try

## 2016-05-28 NOTE — Assessment & Plan Note (Signed)
Faintly pos strep test  Cover with augmentin bid for 7 d Also some resp symptoms- ? Allergy vs uri -will try zyrtec for nasal symptoms  Update if not starting to improve in a week or if worsening  -esp fever or worse ST

## 2016-05-28 NOTE — Progress Notes (Signed)
Pre visit review using our clinic review tool, if applicable. No additional management support is needed unless otherwise documented below in the visit note. 

## 2016-05-28 NOTE — Progress Notes (Signed)
Subjective:    Patient ID: Dean Bishop, male    DOB: 02/19/1966, 51 y.o.   MRN: [id number]  HPI Here for sore throat  Pt of Dr Lianne Bushy   Started with ST last Thursday and then hoarseness  Some runny nose and post nasal drip  A bit of cough  Felt feverish -no fever today   Had a rash on one leg - and that went away (no other rash) Used topical anti itch and also benadryl- a lot better   Has been traveling -in [place]   Results for orders placed or performed in visit on 05/28/16  POCT rapid strep A  Result Value Ref Range   Rapid Strep A Screen Positive (A) Negative     Patient Active Problem List   Diagnosis Date Noted  . Strep pharyngitis 05/28/2016  . Pain in joint, shoulder region 10/10/2015  . ETD (eustachian tube dysfunction) 03/08/2014  . Asthma 05/05/2013  . Sore throat 12/09/2012  . Vertigo 12/16/2011  . 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 Essential hypertension, benign 01/18/2011  . IRRITABILITY 11/04/2009  . HEMORRHOIDS, INTERNAL, THROMBOSED 06/27/2009  . HYPERLIPIDEMIA 05/04/2009  . DEPRESSION/ANXIETY 08/13/2007  . ALLERGY, ENVIRONMENTAL 08/13/2007   Past Medical History:  Diagnosis Date  . Acute sinusitis, unspecified   . Allergy    Vasomotor / Environmental / Chemicals at work  . Anxiety   . Asthma    Dr. Barnetta Chapel  . Depression   . Hyperlipidemia   . Infective otitis externa, unspecified   . Internal thrombosed hemorrhoids   . Irritability   . Shortness of breath   . Special screening for malignant neoplasm of prostate    No past surgical history on file. Social History  Substance Use Topics  . Smoking status: Former Smoker    Types: Cigarettes    Quit date: 01/25/2001  . Smokeless tobacco: Former Neurosurgeon    Quit date: 03/26/2000     Comment: quit over 10 years ago  . Alcohol use 0.0 oz/week     Comment: Minimal   Family History  Problem Relation Age of Onset  . Alcohol abuse Father   . Diabetes Father   . Heart disease Father   .  Heart disease Paternal Grandfather     CABG x 2  . Cancer Neg Hx   . Stroke Neg Hx    Allergies  Allergen Reactions  . Advair Diskus [Fluticasone-Salmeterol]     Hoarse voice   Current Outpatient Prescriptions on File Prior to Visit  Medication Sig Dispense Refill  . albuterol (PROAIR HFA) 108 (90 BASE) MCG/ACT inhaler INHALE 2 PUFFS EVERY 6HRS AS NEEDED FOR COUGH AND WHEEZING 1 Inhaler 12  . azelastine (OPTIVAR) 0.05 % ophthalmic solution Place 1 drop into both eyes as needed.      Marland Kitchen FLUoxetine (PROZAC) 10 MG capsule Take 1 capsule (10 mg total) by mouth daily. MUST SCHEDULE ANNUAL EXAM FOR FURTHER REFILLS 90 capsule 0  . fluticasone (FLONASE) 50 MCG/ACT nasal spray Use 2 sprays in each nostril daily 1 g 5  . meclizine (ANTIVERT) 12.5 MG tablet Take 1-2 tablets (12.5-25 mg total) by mouth 3 (three) times daily as needed for dizziness. 30 tablet 3  . QVAR 80 MCG/ACT inhaler INHALE 1-2 PUFFS INTO THE LUNGS 2 (TWO) TIMES DAILY. 8.7 g 5   No current facility-administered medications on file prior to visit.     Review of Systems    Review of Systems  Constitutional: Negative for fever, appetite change,  fatigue and unexpected weight change.  Eyes: Negative for pain and visual disturbance.  ENT pos for st and hoarse voice/ pnd and rhinorrhea  Respiratory: Negative for wheeze and shortness of breath.   Cardiovascular: Negative for cp or palpitations    Gastrointestinal: Negative for nausea, diarrhea and constipation.  Genitourinary: Negative for urgency and frequency.  Skin: Negative for pallor or rash  (rash is resolved) Neurological: Negative for weakness, light-headedness, numbness and headaches.  Hematological: Negative for adenopathy. Does not bruise/bleed easily.  Psychiatric/Behavioral: Negative for dysphoric mood. The patient is not nervous/anxious.      Objective:   Physical Exam  Constitutional: He appears well-developed and well-nourished. No distress.  Well but fatigued  appearing  HENT:  Head: Normocephalic and atraumatic.  Right Ear: External ear normal.  Left Ear: External ear normal.  Mouth/Throat: Oropharynx is clear and moist. No oropharyngeal exudate.  Nares are injected and congested  No sinus tenderness Clear rhinorrhea and post nasal drip  Throat is erythematous w/o swelling  Eyes: Conjunctivae and EOM are normal. Pupils are equal, round, and reactive to light. Right eye exhibits no discharge. Left eye exhibits no discharge.  Neck: Normal range of motion. Neck supple.  Cardiovascular: Normal rate and normal heart sounds.   Pulmonary/Chest: Effort normal and breath sounds normal. No respiratory distress. He has no wheezes. He has no rales. He exhibits no tenderness.  Lymphadenopathy:    He has no cervical adenopathy.  Neurological: He is alert.  Skin: Skin is warm and dry. No rash noted. No pallor.  Psychiatric: He has a normal mood and affect.          Assessment & Plan:   Problem List Items Addressed This Visit      Respiratory   Strep pharyngitis    Faintly pos strep test  Cover with augmentin bid for 7 d Also some resp symptoms- ? Allergy vs uri -will try zyrtec for nasal symptoms  Update if not starting to improve in a week or if worsening  -esp fever or worse ST        Other   Sore throat - Primary   Relevant Orders   POCT rapid strep A (Completed)

## 2016-06-07 ENCOUNTER — Encounter: Payer: Self-pay | Admitting: Internal Medicine

## 2016-06-07 ENCOUNTER — Encounter (INDEPENDENT_AMBULATORY_CARE_PROVIDER_SITE_OTHER): Payer: Self-pay

## 2016-06-07 ENCOUNTER — Ambulatory Visit (INDEPENDENT_AMBULATORY_CARE_PROVIDER_SITE_OTHER): Payer: 59 | Admitting: Internal Medicine

## 2016-06-07 VITALS — BP 136/80 | HR 100 | Temp 98.4°F | Wt 256.0 lb

## 2016-06-07 DIAGNOSIS — J069 Acute upper respiratory infection, unspecified: Secondary | ICD-10-CM

## 2016-06-07 DIAGNOSIS — B9789 Other viral agents as the cause of diseases classified elsewhere: Secondary | ICD-10-CM | POA: Diagnosis not present

## 2016-06-07 NOTE — Progress Notes (Signed)
HPI  Pt presents to the clinic today with c/o headache, cough and chest congestion. This started yesterday. The cough is nonproductive. He denies runny nose, ear pain or sore throat. He denies fever, chills or body aches. He was treated for strep throat on 05/28/16, finished Augmentin on Monday. He has tried Flonase, Robitussin and Mucinex with some relief. He has not had sick contacts. He does have a history of allergies and asthma.   Review of Systems        Past Medical History:  Diagnosis Date  . Acute sinusitis, unspecified   . Allergy    Vasomotor / Environmental / Chemicals at work  . Anxiety   . Asthma    Dr. Barnetta ChapelWhelan  . Depression   . Hyperlipidemia   . Infective otitis externa, unspecified   . Internal thrombosed hemorrhoids   . Irritability   . Shortness of breath   . Special screening for malignant neoplasm of prostate     Family History  Problem Relation Age of Onset  . Alcohol abuse Father   . Diabetes Father   . Heart disease Father   . Heart disease Paternal Grandfather     CABG x 2  . Cancer Neg Hx   . Stroke Neg Hx     Social History   Social History  . Marital status: Married    Spouse name: N/A  . Number of children: 1  . Years of education: N/A   Occupational History  . 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 Smoking status: Former Smoker    Types: Cigarettes    Quit date: 01/25/2001  . Smokeless tobacco: Former NeurosurgeonUser    Quit date: 03/26/2000     Comment: quit over 10 years ago  . Alcohol use 0.0 oz/week     Comment: Minimal  . Drug use: No  . Sexual activity: Not on file   Other Topics Concern  . Not on file   Social History Narrative   Acupuncturistlectrical Engineer, [employer] grad, Married 1993.    Allergies  Allergen Reactions  . Advair Diskus [Fluticasone-Salmeterol]     Hoarse voice     Constitutional: Positive headache, fatigue. Denies fever or abrupt weight changes.  HEENT:  Denies eye redness, eye pain,  pressure behind the eyes, facial pain, nasal congestion, ear pain, ringing in the ears, wax buildup, runny nose or sore throat. Respiratory: Positive cough. Denies difficulty breathing or shortness of breath.  Cardiovascular: Denies chest pain, chest tightness, palpitations or swelling in the hands or feet.   No other specific complaints in a complete review of systems (except as listed in HPI above).  Objective:   BP 136/80   Pulse 100   Temp 98.4 F (36.9 C) (Oral)   Wt 256 lb (116.1 kg)   SpO2 97%   BMI 33.32 kg/m  Wt Readings from Last 3 Encounters:  06/07/16 256 lb (116.1 kg)  05/28/16 254 lb (115.2 kg)  04/13/16 253 lb (114.8 kg)     General: Appears his stated age, in NAD. HEENT: Head: normal shape and size, no sinus tenderness noted; Throat/Mouth: + PND. Teeth present, mucosa erythematous and moist, no exudate noted, no lesions or ulcerations noted.  Neck: No cervical lymphadenopathy.  Cardiovascular: Normal rate and rhythm. S1,S2 noted.  No murmur, rubs or gallops noted.  Pulmonary/Chest: Normal effort and positive vesicular breath sounds. No respiratory distress. No wheezes, rales or ronchi noted.  Assessment & Plan:   Viral Upper Respiratory Infection with Cough:  No indication for additional antibiotics.  Get some rest and drink plenty of water Start Zyrtec as advised at previous OV Continue Robitussin for cough  RTC as needed or if symptoms persist.   Nicki Reaper, NP

## 2016-06-07 NOTE — Patient Instructions (Signed)
Cough, Adult  A cough helps to clear your throat and lungs. A cough may last only 2?3 weeks (acute), or it may last longer than 8 weeks (chronic). Many different things can cause a cough. A cough may be a sign of an illness or another medical condition.  Follow these instructions at home:  ? Pay attention to any changes in your cough.  ? Take medicines only as told by your doctor.  ? If you were prescribed an antibiotic medicine, take it as told by your doctor. Do not stop taking it even if you start to feel better.  ? Talk with your doctor before you try using a cough medicine.  ? Drink enough fluid to keep your pee (urine) clear or pale yellow.  ? If the air is dry, use a cold steam vaporizer or humidifier in your home.  ? Stay away from things that make you cough at work or at home.  ? If your cough is worse at night, try using extra pillows to raise your head up higher while you sleep.  ? Do not smoke, and try not to be around smoke. If you need help quitting, ask your doctor.  ? Do not have caffeine.  ? Do not drink alcohol.  ? Rest as needed.  Contact a doctor if:  ? You have new problems (symptoms).  ? You cough up yellow fluid (pus).  ? Your cough does not get better after 2?3 weeks, or your cough gets worse.  ? Medicine does not help your cough and you are not sleeping well.  ? You have pain that gets worse or pain that is not helped with medicine.  ? You have a fever.  ? You are losing weight and you do not know why.  ? You have night sweats.  Get help right away if:  ? You cough up blood.  ? You have trouble breathing.  ? Your heartbeat is very fast.  This information is not intended to replace advice given to you by your health care provider. Make sure you discuss any questions you have with your health care provider.  Document Released: 11/23/2010 Document Revised: 08/18/2015 Document Reviewed: 05/19/2014  Elsevier Interactive Patient Education ? 2017 Elsevier Inc.

## 2016-07-13 ENCOUNTER — Encounter: Payer: Self-pay | Admitting: Family Medicine

## 2016-07-13 ENCOUNTER — Ambulatory Visit (INDEPENDENT_AMBULATORY_CARE_PROVIDER_SITE_OTHER): Payer: 59 | Admitting: Family Medicine

## 2016-07-13 VITALS — BP 158/92 | HR 64 | Temp 98.5°F | Ht 74.0 in | Wt 251.8 lb

## 2016-07-13 DIAGNOSIS — Z1211 Encounter for screening for malignant neoplasm of colon: Secondary | ICD-10-CM

## 2016-07-13 DIAGNOSIS — E785 Hyperlipidemia, unspecified: Secondary | ICD-10-CM

## 2016-07-13 DIAGNOSIS — F341 Dysthymic disorder: Secondary | ICD-10-CM

## 2016-07-13 DIAGNOSIS — Z7189 Other specified counseling: Secondary | ICD-10-CM

## 2016-07-13 DIAGNOSIS — J45909 Unspecified asthma, uncomplicated: Secondary | ICD-10-CM

## 2016-07-13 DIAGNOSIS — I10 Essential (primary) hypertension: Secondary | ICD-10-CM | POA: Diagnosis not present

## 2016-07-13 DIAGNOSIS — Z0001 Encounter for general adult medical examination with abnormal findings: Secondary | ICD-10-CM | POA: Diagnosis not present

## 2016-07-13 LAB — COMPREHENSIVE METABOLIC PANEL
ALK PHOS: 52 U/L (ref 39–117)
ALT: 25 U/L (ref 0–53)
AST: 22 U/L (ref 0–37)
Albumin: 4.4 g/dL (ref 3.5–5.2)
BILIRUBIN TOTAL: 0.6 mg/dL (ref 0.2–1.2)
BUN: 17 mg/dL (ref 6–23)
CALCIUM: 9.9 mg/dL (ref 8.4–10.5)
CO2: 30 mEq/L (ref 19–32)
CREATININE: 0.99 mg/dL (ref 0.40–1.50)
Chloride: 102 mEq/L (ref 96–112)
GFR: 84.68 mL/min (ref >60.00)
GLUCOSE: 91 mg/dL (ref 70–99)
Potassium: 4.2 mEq/L (ref 3.5–5.1)
Sodium: 138 mEq/L (ref 135–145)
TOTAL PROTEIN: 7.1 g/dL (ref 6.0–8.3)

## 2016-07-13 LAB — LDL CHOLESTEROL, DIRECT: Direct LDL: 200 mg/dL

## 2016-07-13 LAB — LIPID PANEL
Cholesterol: 292 mg/dL — ABNORMAL HIGH (ref 0–200)
HDL: 36 mg/dL — AB (ref >39.00)
NonHDL: 255.69
TRIGLYCERIDES: 239 mg/dL — AB (ref 0.0–149.0)
Total CHOL/HDL Ratio: 8
VLDL: 47.8 mg/dL — ABNORMAL HIGH (ref 0.0–40.0)

## 2016-07-13 MED ORDER — FLUOXETINE HCL 10 MG PO CAPS: 10.0000 mg | ORAL_CAPSULE | Freq: Every day | ORAL | 3 refills | Status: DC

## 2016-07-13 MED ORDER — ALBUTEROL SULFATE HFA 108 (90 BASE) MCG/ACT IN AERS: INHALATION_SPRAY | RESPIRATORY_TRACT | 12 refills | Status: DC

## 2016-07-13 MED ORDER — LISINOPRIL 10 MG PO TABS: 5.0000 mg | ORAL_TABLET | Freq: Every day | ORAL | 3 refills | Status: DC

## 2016-07-13 NOTE — Progress Notes (Signed)
Pre visit review using our clinic review tool, if applicable. No additional management support is needed unless otherwise documented below in the visit note. 

## 2016-07-13 NOTE — Patient Instructions (Addendum)
Start lisinopril.  Update me about your pressure in about 1-2 weeks.  Increase from 5 to  a day if BP still >140/>90.   Go to the lab on the way out.  We'll contact you with your lab report. Take care.  Glad to see you.

## 2016-07-13 NOTE — Progress Notes (Signed)
CPE- See plan.  Routine anticipatory guidance given to patient.  See health maintenance.  The possibility exists that previously documented standard health maintenance information may have been brought forward from a previous encounter into this note.  If needed, that same information has been updated to reflect the current situation based on today's encounter.    Tetanus 2011 Flu prev done PNA not due Shingles not due Prostate cancer screening and PSA options (with potential risks and benefits of testing vs not testing) were discussed along with recent recs/guidelines.  He declined testing PSA at this point. D/w patient [id number] for colon cancer screening, including IFOB vs. colonoscopy.  Risks and benefits of both were discussed and patient voiced understanding.  Pt elects for IFOB.   [facility]  D/w pt. Done 2017.   Living will d/w pt.  Wife designated if patient were incapacitated.    Hypertension:    No meds yet Chest pain with exertion:no Edema:no Short of breath:no Average home BPs: had been increasing in the last few months on home check.   FH HTN noted.   Diet and exercise d/w pt.  He is working on both.    Start ACE and nsaid caution d/w pt.  ACE cautions d/w pt.    R shoulder improved after surgery.  Normal ROM.    Asthma. Doing well. Rare SABA use.  No ADE on med.  Not wheezing usually.  Some occ sx with dust or pollen. No thrush.  Voice is okay.    Mood is good.  No ADE on med.  He notes inc in irritability off medicine.  No SI/HI.  He wanted to continue on med.    PMH and SH reviewed  Meds, vitals, and allergies reviewed.   ROS: Per HPI.  Unless specifically indicated otherwise in HPI, the patient denies:  General: fever. Eyes: acute vision changes ENT: sore throat Cardiovascular: chest pain Respiratory: SOB GI: vomiting GU: dysuria Musculoskeletal: acute back pain Derm: acute rash Neuro: acute motor dysfunction Psych: worsening  mood Endocrine: polydipsia Heme: bleeding Allergy: hayfever  GEN: nad, alert and oriented HEENT: mucous membranes moist NECK: supple w/o LA CV: rrr. PULM: ctab, no inc wob ABD: soft, +bs EXT: no edema SKIN: no acute rash

## 2016-07-15 DIAGNOSIS — Z0000 Encounter for general adult medical examination without abnormal findings: Secondary | ICD-10-CM | POA: Insufficient documentation

## 2016-07-15 DIAGNOSIS — Z7189 Other specified counseling: Secondary | ICD-10-CM | POA: Insufficient documentation

## 2016-07-15 DIAGNOSIS — Z0001 Encounter for general adult medical examination with abnormal findings: Secondary | ICD-10-CM | POA: Insufficient documentation

## 2016-07-15 NOTE — Assessment & Plan Note (Signed)
Doing well. Continue as is. No change in medications. 

## 2016-07-15 NOTE — Assessment & Plan Note (Signed)
Discussed with patient. See notes on labs. Diet and exercise d/w pt.  He is working on both.   Start ACE and nsaid caution d/w pt.  ACE cautions d/w pt.   See after visit summary.

## 2016-07-15 NOTE — Assessment & Plan Note (Signed)
Living will d/w pt.  Wife designated if patient were incapacitated.   ?

## 2016-07-15 NOTE — Assessment & Plan Note (Signed)
Doing well. Continue as is. No change in medications.

## 2016-07-15 NOTE — Assessment & Plan Note (Signed)
Tetanus 2011 Flu prev done PNA not due Shingles not due Prostate cancer screening and PSA options (with potential risks and benefits of testing vs not testing) were discussed along with recent recs/guidelines.  He declined testing PSA at this point. D/w patient [id number] for colon cancer screening, including IFOB vs. colonoscopy.  Risks and benefits of both were discussed and patient voiced understanding.  Pt elects for IFOB.   [facility]  D/w pt. Done 2017.   Living will d/w pt.  Wife designated if patient were incapacitated.

## 2016-07-15 NOTE — Assessment & Plan Note (Signed)
See notes on labs. 

## 2016-07-17 ENCOUNTER — Telehealth: Payer: Self-pay | Admitting: Family Medicine

## 2016-07-17 NOTE — Telephone Encounter (Signed)
Patient returned Lugene's call. °

## 2016-09-17 ENCOUNTER — Other Ambulatory Visit: Payer: Self-pay | Admitting: *Deleted

## 2016-09-17 MED ORDER — LISINOPRIL 10 MG PO TABS: 5.0000 mg | ORAL_TABLET | Freq: Every day | ORAL | 3 refills | Status: DC

## 2016-09-21 ENCOUNTER — Other Ambulatory Visit: Payer: Self-pay | Admitting: Family Medicine

## 2016-09-21 MED ORDER — FLUOXETINE HCL 10 MG PO CAPS
10.0000 mg | ORAL_CAPSULE | Freq: Every day | ORAL | 2 refills | Status: DC
Start: 2016-09-21 — End: 2017-05-20

## 2016-09-21 NOTE — Telephone Encounter (Addendum)
Received a voicemail message from patient's spouse stating that they have request a refill of Fluoxetine 10 mg to [facility].  Looks like it was sent to [facility] on 07/13/2016.  Will send the refill to [facility] as requested.

## 2017-02-12 ENCOUNTER — Ambulatory Visit (INDEPENDENT_AMBULATORY_CARE_PROVIDER_SITE_OTHER): Payer: 59

## 2017-02-12 DIAGNOSIS — Z23 Encounter for immunization: Secondary | ICD-10-CM

## 2017-02-15 ENCOUNTER — Ambulatory Visit: Payer: 59

## 2017-03-25 ENCOUNTER — Telehealth: Payer: Self-pay | Admitting: Family Medicine

## 2017-03-25 NOTE — Telephone Encounter (Signed)
Copied from CRM [id number]. Topic: Quick Communication - Rx Refill/Question >> Mar 25, 2017 10:02 AM Landry MellowFoltz, Melissa J wrote: Has the patient contacted their pharmacy? Yes.     (Agent: If no, request that the patient contact the pharmacy for the refill.)   Preferred Pharmacy (with phone number or street name): needs refill on meclizine (ANTIVERT) 12.5 MG tablet, needs to change pharmacy to [facility].   Pt has 5 left     Agent: Please be advised that RX refills may take up to 3 business days. We ask that you follow-up with your pharmacy.

## 2017-03-25 NOTE — Telephone Encounter (Signed)
Last visit 07/13/16. Requests Antivert. Thanks.

## 2017-03-27 MED ORDER — MECLIZINE HCL 12.5 MG PO TABS: 12.5000 mg | ORAL_TABLET | Freq: Three times a day (TID) | ORAL | 3 refills | Status: DC | PRN

## 2017-03-27 NOTE — Telephone Encounter (Signed)
Called [facility] and cancelled script sent there in error. Patient notified by telephone.

## 2017-03-27 NOTE — Telephone Encounter (Signed)
Last Rx 10/2015 #30 3r. Last OV 06/2016

## 2017-03-27 NOTE — Telephone Encounter (Signed)
[facility].  F/u prn.  Thanks.

## 2017-04-18 ENCOUNTER — Telehealth: Payer: Self-pay | Admitting: Family Medicine

## 2017-04-18 NOTE — Telephone Encounter (Signed)
[facility] [address] [place].

## 2017-04-18 NOTE — Telephone Encounter (Signed)
Copied from CRM [id number]. Topic: Quick Communication - See Telephone Encounter >> Apr 18, 2017 11:34 AM Windy KalataMichael, Amada Hallisey L, NT wrote: CRM for notification. See Telephone encounter for:  04/18/17.  Pt is calling and needs a refill on Qvar 80mcg and would also like to clarify if he needs to still be on the 80mcg dosage. Please contact pt. [facility].

## 2017-04-18 NOTE — Telephone Encounter (Signed)
Patient's wife will call back with pharmacy preference since [facility] is closing.

## 2017-04-19 MED ORDER — BECLOMETHASONE DIPROPIONATE 80 MCG/ACT IN AERS: INHALATION_SPRAY | RESPIRATORY_TRACT | 5 refills | Status: DC

## 2017-04-19 NOTE — Telephone Encounter (Signed)
Wife advised. 

## 2017-04-19 NOTE — Telephone Encounter (Signed)
Sent.  Would continue for now.  If there is a period of time in the year when he doesn't need his albuterol at all, then he could consider slow taper of qvar at that point.  Update me as needed.  Thanks.

## 2017-05-20 ENCOUNTER — Other Ambulatory Visit: Payer: Self-pay | Admitting: Primary Care

## 2017-05-20 NOTE — Telephone Encounter (Signed)
Ok to refill? Electronically refill request for FLUoxetine (PROZAC) 10 MG capsule  Last prescribed by Jae DireKate on 09/21/2016. Last seen by Dr Para Marchuncan on 07/13/2016

## 2017-05-22 NOTE — Telephone Encounter (Signed)
Sent. Thanks.   

## 2017-06-04 ENCOUNTER — Encounter: Payer: Self-pay | Admitting: Family Medicine

## 2017-06-04 ENCOUNTER — Ambulatory Visit: Payer: 59 | Admitting: Family Medicine

## 2017-06-04 DIAGNOSIS — I10 Essential (primary) hypertension: Secondary | ICD-10-CM

## 2017-06-04 MED ORDER — LISINOPRIL 10 MG PO TABS: 10.0000 mg | ORAL_TABLET | Freq: Every day | ORAL | Status: DC

## 2017-06-04 NOTE — Progress Notes (Signed)
Hypertension:    Using medication without problems or lightheadedness: yes Chest pain with exertion:no Edema:no Short of breath:no Taking 10mg  lisinopril a day.   No ADE on med.    BP has been 135-150/90-100s at home.  Used same cuff, wrist cuff at home with those readings.  [facility].    He has been on the road a lot for work, worldwide, 6 countries at [place] other than the [place].    Recheck BP similar on 2 readings here, on our cuff, at goal.  He didn't have his cuff.    ROS: Per HPI unless specifically indicated in ROS section   Meds, vitals, and allergies reviewed.   GEN: nad, alert and oriented HEENT: mucous membranes moist NECK: supple w/o LA CV: rrr PULM: ctab, no inc wob ABD: soft, +bs EXT: no edema

## 2017-06-04 NOTE — Patient Instructions (Signed)
Schedule a physical when work settles down.   Check your cuff at home.  I suspect it is reading artificially high.   Don't change your meds for now.  Take care.  Glad to see you.

## 2017-06-05 NOTE — Assessment & Plan Note (Signed)
Controlled here today.  I suspect he has artificially high readings on outside measurement.  Discussed with patient.  Continue current medications.  He will work on diet his exercise as best he can.  He will schedule physical when possible.  Update me as needed.  He agrees.

## 2017-08-26 DIAGNOSIS — M545 Low back pain: Secondary | ICD-10-CM | POA: Diagnosis not present

## 2017-09-22 ENCOUNTER — Other Ambulatory Visit: Payer: Self-pay | Admitting: Family Medicine

## 2017-09-23 NOTE — Telephone Encounter (Signed)
Electronic refill request Last office visit 06/04/17 (See notes) Refill request does not match medication list which shows one a day Please confirm directions

## 2017-09-24 NOTE — Telephone Encounter (Signed)
Per pt's wife: pt is taking lisinopril (PRINIVIL,ZESTRIL) 10 MG tablet 1/day.

## 2017-09-24 NOTE — Telephone Encounter (Signed)
Patient stated that he has been taking one a day. Patient stated that he has not been checking his blood pressure because he determined that the wrist cuff he was checking it with was not accurate. Patient stated that he plans on buying a new cuff and will start back monitoring his blood pressure and if it is high he will let Dr. Para Marchuncan know.

## 2017-09-24 NOTE — Telephone Encounter (Signed)
Please verify current dose with patient and let me know.  How is his BP?  Thanks.

## 2017-09-24 NOTE — Telephone Encounter (Signed)
Noted. Thanks.  Sent.  

## 2017-10-07 ENCOUNTER — Ambulatory Visit (INDEPENDENT_AMBULATORY_CARE_PROVIDER_SITE_OTHER): Payer: 59 | Admitting: Family Medicine

## 2017-10-07 ENCOUNTER — Encounter: Payer: Self-pay | Admitting: Family Medicine

## 2017-10-07 VITALS — BP 132/88 | HR 94 | Temp 98.4°F | Ht 74.0 in | Wt 253.8 lb

## 2017-10-07 DIAGNOSIS — Z7189 Other specified counseling: Secondary | ICD-10-CM

## 2017-10-07 DIAGNOSIS — Z1211 Encounter for screening for malignant neoplasm of colon: Secondary | ICD-10-CM

## 2017-10-07 DIAGNOSIS — I10 Essential (primary) hypertension: Secondary | ICD-10-CM

## 2017-10-07 DIAGNOSIS — Z0000 Encounter for general adult medical examination without abnormal findings: Secondary | ICD-10-CM | POA: Diagnosis not present

## 2017-10-07 DIAGNOSIS — Z0001 Encounter for general adult medical examination with abnormal findings: Secondary | ICD-10-CM

## 2017-10-07 DIAGNOSIS — R454 Irritability and anger: Secondary | ICD-10-CM

## 2017-10-07 DIAGNOSIS — J45909 Unspecified asthma, uncomplicated: Secondary | ICD-10-CM

## 2017-10-07 MED ORDER — ALBUTEROL SULFATE HFA 108 (90 BASE) MCG/ACT IN AERS: INHALATION_SPRAY | RESPIRATORY_TRACT | 12 refills | Status: DC

## 2017-10-07 MED ORDER — BECLOMETHASONE DIPROPIONATE 80 MCG/ACT IN AERS: INHALATION_SPRAY | RESPIRATORY_TRACT | 12 refills | Status: DC

## 2017-10-07 MED ORDER — FLUTICASONE PROPIONATE 50 MCG/ACT NA SUSP: 2.0000 | Freq: Every day | NASAL | 12 refills | Status: DC

## 2017-10-07 MED ORDER — FLUOXETINE HCL 10 MG PO CAPS: 10.0000 mg | ORAL_CAPSULE | Freq: Every day | ORAL | 3 refills | Status: DC

## 2017-10-07 MED ORDER — LISINOPRIL 10 MG PO TABS: 10.0000 mg | ORAL_TABLET | Freq: Every day | ORAL | 3 refills | Status: DC

## 2017-10-07 NOTE — Patient Instructions (Addendum)
We will call about your referral.  [name] or Alvina Chounastasiya will call you if you don't see one of them on the way out.  Take care.  Glad to see you.  Don't change your meds for now.

## 2017-10-07 NOTE — Progress Notes (Signed)
CPE- See plan.  Routine anticipatory guidance given to patient.  See health maintenance.  The possibility exists that previously documented standard health maintenance information may have been brought forward from a previous encounter into this note.  If needed, that same information has been updated to reflect the current situation based on today's encounter.    Tetanus 2011 Flu prev done PNA deferred for now Shingles out of stock.   Prostate cancer screening and PSA options (with potential risks and benefits of testing vs not testing) were discussed along with recent recs/guidelines.  He declined testing PSA at this point. D/w patient [id number]:options for colon cancer screening, including IFOB vs. colonoscopy.  Risks and benefits of both were discussed and patient voiced understanding.  Pt elects for IFOB.   [facility]  D/w pt. Done 2017.   Living will d/w pt.  Wife designated if patient were incapacitated.   Diet and exercise d/w pt.  Affected by travel.    Hypertension:    Using medication without problems or lightheadedness: yes Chest pain with exertion:no Edema:no Short of breath:see below.  Not normally SOB o/w.   Labs pending.   Mood is good, less irritable on med.  No ADE on med.  He wanted to continue as is.  No SI/HI.    He had a possible/likely industrial exposure a few weeks ago. He thought some of his sx were worse after very small particulate exposure with work.  He had cough, sputum, SOB with prev exposure, wheeze.   Ventolin seemed to help more than other albuterol forms.  His back was sore from coughing.  We talked about pulmonary referral, encouraged.  He has known sx with mold exposure.  He clearly feels better at this point.  Usually rare SABA when feeling well. He never had any sx until his initial industrial exposure a few years ago.   PMH and SH reviewed  Meds, vitals, and allergies reviewed.   ROS: Per HPI.  Unless specifically indicated  otherwise in HPI, the patient denies:  General: fever. Eyes: acute vision changes ENT: sore throat Cardiovascular: chest pain Respiratory: SOB GI: vomiting GU: dysuria Musculoskeletal: acute back pain Derm: acute rash Neuro: acute motor dysfunction Psych: worsening mood Endocrine: polydipsia Heme: bleeding Allergy: hayfever  GEN: nad, alert and oriented HEENT: mucous membranes moist NECK: supple w/o LA CV: rrr. PULM: ctab, no inc wob ABD: soft, +bs EXT: no edema SKIN: no acute rash

## 2017-10-08 LAB — LDL CHOLESTEROL, DIRECT: LDL DIRECT: 213 mg/dL

## 2017-10-08 LAB — COMPREHENSIVE METABOLIC PANEL
ALK PHOS: 56 U/L (ref 39–117)
ALT: 34 U/L (ref 0–53)
AST: 20 U/L (ref 0–37)
Albumin: 4.5 g/dL (ref 3.5–5.2)
BILIRUBIN TOTAL: 0.6 mg/dL (ref 0.2–1.2)
BUN: 20 mg/dL (ref 6–23)
CO2: 29 meq/L (ref 19–32)
CREATININE: 0.99 mg/dL (ref 0.40–1.50)
Calcium: 9.5 mg/dL (ref 8.4–10.5)
Chloride: 99 mEq/L (ref 96–112)
GFR: 84.27 mL/min (ref >60.00)
GLUCOSE: 89 mg/dL (ref 70–99)
Potassium: 4.2 mEq/L (ref 3.5–5.1)
Sodium: 137 mEq/L (ref 135–145)
TOTAL PROTEIN: 7.2 g/dL (ref 6.0–8.3)

## 2017-10-08 LAB — LIPID PANEL
Cholesterol: 304 mg/dL — ABNORMAL HIGH (ref 0–200)
HDL: 44 mg/dL (ref >39.00)
NONHDL: 260.17
Total CHOL/HDL Ratio: 7
Triglycerides: 318 mg/dL — ABNORMAL HIGH (ref 0.0–149.0)
VLDL: 63.6 mg/dL — ABNORMAL HIGH (ref 0.0–40.0)

## 2017-10-08 NOTE — Assessment & Plan Note (Signed)
Tetanus 2011 Flu prev done PNA deferred for now Shingles out of stock.   Prostate cancer screening and PSA options (with potential risks and benefits of testing vs not testing) were discussed along with recent recs/guidelines.  He declined testing PSA at this point. D/w patient [id number]:options for colon cancer screening, including IFOB vs. colonoscopy.  Risks and benefits of both were discussed and patient voiced understanding.  Pt elects for IFOB.   [facility]  D/w pt. Done 2017.   Living will d/w pt.  Wife designated if patient were incapacitated.   Diet and exercise d/w pt.  Affected by travel.

## 2017-10-08 NOTE — Assessment & Plan Note (Signed)
Living will d/w pt.  Wife designated if patient were incapacitated.   ?

## 2017-10-08 NOTE — Assessment & Plan Note (Signed)
Reasonable control.  Discussed with patient about diet and exercise, especially with travel for work.  No change in med.  He agrees.

## 2017-10-08 NOTE — Assessment & Plan Note (Signed)
With industrial particulate exposure noted.  He seemed to do better with Ventolin as a rescue inhaler.  Prescription sent.  Continue beclomethasone otherwise.  Refer to pulmonary.  Lungs are clear now.  He is clearly improved recently.  He agrees with plan.

## 2017-10-08 NOTE — Assessment & Plan Note (Signed)
Improved medication.  No adverse effect.  Continue as is.  He agrees.

## 2017-10-15 ENCOUNTER — Ambulatory Visit (INDEPENDENT_AMBULATORY_CARE_PROVIDER_SITE_OTHER): Payer: 59 | Admitting: Pulmonary Disease

## 2017-10-15 ENCOUNTER — Other Ambulatory Visit: Payer: Self-pay | Admitting: Family Medicine

## 2017-10-15 ENCOUNTER — Encounter: Payer: Self-pay | Admitting: Pulmonary Disease

## 2017-10-15 VITALS — BP 126/84 | HR 105 | Ht 74.0 in | Wt 254.0 lb

## 2017-10-15 DIAGNOSIS — J45909 Unspecified asthma, uncomplicated: Secondary | ICD-10-CM | POA: Diagnosis not present

## 2017-10-15 DIAGNOSIS — I10 Essential (primary) hypertension: Secondary | ICD-10-CM | POA: Diagnosis not present

## 2017-10-15 DIAGNOSIS — E785 Hyperlipidemia, unspecified: Secondary | ICD-10-CM

## 2017-10-15 MED ORDER — PRAVASTATIN SODIUM 20 MG PO TABS: 20.0000 mg | ORAL_TABLET | Freq: Every day | ORAL | 3 refills | Status: DC

## 2017-10-15 NOTE — Patient Instructions (Addendum)
Lung function appears ok  STOP taking lisinopril. Measure your blood pressure daily, if greater than 140/90, consider starting losartan 50 mg daily instead  Take Qvar 1 puff twice daily regularly for the next 6 months, rinse mouth after use  Call us if you are having frequent flares

## 2017-10-15 NOTE — Assessment & Plan Note (Signed)
STOP taking lisinopril. Measure your blood pressure daily, if greater than 140/90, consider starting losartan 50 mg daily instead

## 2017-10-15 NOTE — Progress Notes (Signed)
Subjective:    Patient ID: Dean Bishop, male    DOB: 1966/03/18, 52 y.o.   MRN: [id number]  HPI  Chief Complaint  Patient presents with  . Pulm Consult    Referred by Dr. Crawford Givens for asthma. Patient had a severe attack about 4 weeks ago. Productive cough with clear phlegm.     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 He reports adult-onset asthma 5 years ago when he was exposed to powdered chemicals at work.  He was evaluated by pulmonologist in [facility] and given inhalers, skin testing was negative for allergies.  Since then he has required maintenance Qvar inhaler which he has taken on an as-needed basis.  He has occasionally needed albuterol inhaler for rescue. He reports his triggers being dust and humidity weather with the rains and allergies in the spring and fall. He specifically reports worse symptoms when he goes into the "super sac room".  His colleagues are aware of this and he often uses a mask when he goes and there.  About 5 weeks ago, on a trip to [place] for work, he was exposed to micron dust and this triggered a flareup even though he was using his Qvar.  This is mostly resolved now but he still reports a morning cough and occasionally a nocturnal cough that wakes him up from sleep.  He denies nasal congestion or sore throat.  He reports occasional heartburn for which she takes over-the-counter Tums.  His wife has noted intermittent wheezing.  He smoked about a pack per day until 2000, less than 20 pack years. He lives with his wife and children, there is a cat outside the house for many years, he just got a dog about 2 weeks ago  Medication review shows lisinopril for hypertension for about a year   Significant tests/ events reviewed  Spirometry showed ratio 70, FEV1 of 81% FVC of 89%, suggesting mild airway obstruction    Past Medical History:  Diagnosis Date  . Allergy    Vasomotor / Environmental /  Chemicals at work  . Anxiety   . Asthma    Dr. Barnetta Chapel  . Depression   . Hyperlipidemia   . Hypertension   . Internal thrombosed hemorrhoids   . Irritability     Past Surgical History:  Procedure Laterality Date  . SHOULDER SURGERY Right 2017    Allergies  Allergen Reactions  . Advair Diskus [Fluticasone-Salmeterol]     Hoarse voice    Social History   Socioeconomic History  . Marital status: Married    Spouse name: Not on file  . Number of children: 1  . Years of education: Not on file  . Highest education level: Not on file  Occupational History  . Occupation: Geologist, engineering: [employer]    Comment: [employer]  Social Needs  . Financial resource strain: Not on file  . Food insecurity:    Worry: Not on file    Inability: Not on file  . Transportation needs:    Medical: Not on file    Non-medical: Not on file  Tobacco Use  . Smoking status: Former Smoker    Types: Cigarettes    Last attempt to quit: 01/25/2001    Years since quitting: 16.7  . Smokeless tobacco: Former Neurosurgeon    Quit date: 03/26/2000  . Tobacco comment: quit over 10 years ago  Substance and Sexual Activity  . Alcohol use: Yes  Comment: Minimal  . Drug use: No  . Sexual activity: Not on file  Lifestyle  . Physical activity:    Days per week: Not on file    Minutes per session: Not on file  . Stress: Not on file  Relationships  . Social connections:    Talks on phone: Not on file    Gets together: Not on file    Attends religious service: Not on file    Active member of club or organization: Not on file    Attends meetings of clubs or organizations: Not on file    Relationship status: Not on file  . Intimate partner violence:    Fear of current or ex partner: Not on file    Emotionally abused: Not on file    Physically abused: Not on file    Forced sexual activity: Not on file  Other Topics Concern  . Not on file  Social History Narrative   Acupuncturistlectrical Engineer, [employer]  grad, Married 1993.      Family History  Problem Relation Age of Onset  . Alcohol abuse Father   . Diabetes Father   . Heart disease Father   . Hypertension Mother   . Hypertension Sister   . Hypertension Brother   . Heart disease Paternal Grandfather        CABG x 2  . Cancer Neg Hx   . Stroke Neg Hx   . Colon cancer Neg Hx   . Prostate cancer Neg Hx      Review of Systems  Constitutional: Negative for fever and unexpected weight change.  HENT: Negative for congestion, dental problem, ear pain, nosebleeds, postnasal drip, rhinorrhea, sinus pressure, sneezing, sore throat and trouble swallowing.   Eyes: Negative for redness and itching.  Respiratory: Positive for cough. Negative for chest tightness, shortness of breath and wheezing.   Cardiovascular: Negative for palpitations and leg swelling.  Gastrointestinal: Negative for nausea and vomiting.  Genitourinary: Negative for dysuria.  Musculoskeletal: Negative for joint swelling.  Skin: Negative for rash.  Allergic/Immunologic: Negative.  Negative for environmental allergies, food allergies and immunocompromised state.  Neurological: Negative for headaches.  Hematological: Does not bruise/bleed easily.  Psychiatric/Behavioral: Negative for dysphoric mood. The patient is not nervous/anxious.        Objective:   Physical Exam   Gen. Pleasant, well-nourished, in no distress, normal affect ENT - no thrush, no post nasal drip Neck: No JVD, no thyromegaly, no carotid bruits Lungs: no use of accessory muscles, no dullness to percussion, clear without rales or rhonchi  Cardiovascular: Rhythm regular, heart sounds  normal, no murmurs or gallops, no peripheral edema Abdomen: soft and non-tender, no hepatosplenomegaly, BS normal. Musculoskeletal: No deformities, no cyanosis or clubbing Neuro:  alert, non focal        Assessment & Plan:

## 2017-10-15 NOTE — Assessment & Plan Note (Signed)
Appears to be mild persistent symptoms in this remote smoker. This may be related to occupational exposure.  Other possibilities include allergic rhinitis and GERD.  Lisinopril may be contributing.  Lung function appears ok  He seems to have returned to baseline after his recent flareup  Take Qvar 1 puff twice daily regularly for the next 6 months, rinse mouth after use  Call us if you are having frequent flares

## 2017-10-22 ENCOUNTER — Other Ambulatory Visit (INDEPENDENT_AMBULATORY_CARE_PROVIDER_SITE_OTHER): Payer: 59

## 2017-10-22 DIAGNOSIS — Z1211 Encounter for screening for malignant neoplasm of colon: Secondary | ICD-10-CM

## 2017-10-22 LAB — FECAL OCCULT BLOOD, IMMUNOCHEMICAL: FECAL OCCULT BLD: NEGATIVE

## 2017-10-23 ENCOUNTER — Encounter: Payer: Self-pay | Admitting: *Deleted

## 2017-12-09 ENCOUNTER — Other Ambulatory Visit: Payer: Self-pay | Admitting: Family Medicine

## 2017-12-18 DIAGNOSIS — Z23 Encounter for immunization: Secondary | ICD-10-CM | POA: Diagnosis not present

## 2017-12-26 ENCOUNTER — Other Ambulatory Visit (INDEPENDENT_AMBULATORY_CARE_PROVIDER_SITE_OTHER): Payer: 59

## 2017-12-26 DIAGNOSIS — E785 Hyperlipidemia, unspecified: Secondary | ICD-10-CM

## 2017-12-27 LAB — LIPID PANEL
CHOL/HDL RATIO: 7
CHOLESTEROL: 251 mg/dL — AB (ref 0–200)
HDL: 33.5 mg/dL — AB (ref >39.00)
NonHDL: 217.23
Triglycerides: 268 mg/dL — ABNORMAL HIGH (ref 0.0–149.0)
VLDL: 53.6 mg/dL — AB (ref 0.0–40.0)

## 2017-12-27 LAB — HEPATIC FUNCTION PANEL
ALK PHOS: 51 U/L (ref 39–117)
ALT: 35 U/L (ref 0–53)
AST: 25 U/L (ref 0–37)
Albumin: 4.5 g/dL (ref 3.5–5.2)
BILIRUBIN TOTAL: 0.4 mg/dL (ref 0.2–1.2)
Bilirubin, Direct: 0.1 mg/dL (ref 0.0–0.3)
Total Protein: 7.3 g/dL (ref 6.0–8.3)

## 2017-12-27 LAB — LDL CHOLESTEROL, DIRECT: Direct LDL: 174 mg/dL

## 2017-12-29 ENCOUNTER — Other Ambulatory Visit: Payer: Self-pay | Admitting: Family Medicine

## 2017-12-29 DIAGNOSIS — E785 Hyperlipidemia, unspecified: Secondary | ICD-10-CM

## 2017-12-29 MED ORDER — PRAVASTATIN SODIUM 20 MG PO TABS: 30.0000 mg | ORAL_TABLET | Freq: Every day | ORAL | Status: DC

## 2017-12-31 ENCOUNTER — Other Ambulatory Visit: Payer: Self-pay | Admitting: *Deleted

## 2017-12-31 DIAGNOSIS — E785 Hyperlipidemia, unspecified: Secondary | ICD-10-CM

## 2017-12-31 MED ORDER — PRAVASTATIN SODIUM 20 MG PO TABS: 30.0000 mg | ORAL_TABLET | Freq: Every day | ORAL | 2 refills | Status: DC

## 2018-01-14 ENCOUNTER — Ambulatory Visit: Payer: 59 | Admitting: Family Medicine

## 2018-01-14 ENCOUNTER — Encounter: Payer: Self-pay | Admitting: Family Medicine

## 2018-01-14 DIAGNOSIS — M545 Low back pain, unspecified: Secondary | ICD-10-CM

## 2018-01-14 MED ORDER — PREDNISONE 20 MG PO TABS: ORAL_TABLET | ORAL | 0 refills | Status: DC

## 2018-01-14 MED ORDER — HYDROCODONE-ACETAMINOPHEN 5-325 MG PO TABS: 0.5000 | ORAL_TABLET | Freq: Four times a day (QID) | ORAL | 0 refills | Status: DC | PRN

## 2018-01-14 NOTE — Patient Instructions (Signed)
Try ice or heat.  Sedation caution on vicodin.  Prednisone with food.  Don't take ibuprofen or aleve.  Take care.  Glad to see you.  Let me know if you can't get seen by the back clinic.  If you have weakness then go to the ER.

## 2018-01-14 NOTE — Progress Notes (Signed)
Back pain.  Started 01/09/18.  Was moving a dog house, pain then.  More pain the next AM.  Then had to fly out of town.  More pain in the meantime.  Lower back B and R buttock.  No pain down to the foot.  Less pain laying down.  More pain sitting or standing.  Pain with twisting.  No rash.  No B/B sx.  No numbness.    H/o back pain over the years, but this is clearly worse.    He has an appointment with ortho Monday.    Prev MRI with IMPRESSION: Spondylosis most notable at L4-5 where a shallow broad-based central and eccentric to the left disc protrusion results in left worse than right lateral recess narrowing which could irritate either descending L5 root and mild to moderate central canal stenosis.  Shallow disc bulging L3-4 and L5-S1 without central canal stenosis.  Meds, vitals, and allergies reviewed.   ROS: Per HPI unless specifically indicated in ROS section   nad but uncomfortable due to back pain ncat rrr ctab No cva pain B SLR positive.   S/S wnl BLE Midline lower back ttp

## 2018-01-15 DIAGNOSIS — M545 Low back pain, unspecified: Secondary | ICD-10-CM | POA: Insufficient documentation

## 2018-01-15 NOTE — Assessment & Plan Note (Addendum)
With significant pain but no emergent symptoms.  Still okay for outpatient follow-up.  He has appointment with the spine clinic pending.  Discussed options.  He reports that he can tolerate prednisone.  Reasonable to start prednisone with routine steroid cautions in the meantime.  He can use Vicodin as needed for pain.  Sedation caution.  I will defer additional imaging at this point to the orthopedic clinic.  He agrees.  Routine emergency room cautions given.

## 2018-01-20 DIAGNOSIS — M545 Low back pain: Secondary | ICD-10-CM | POA: Diagnosis not present

## 2018-01-27 DIAGNOSIS — M47816 Spondylosis without myelopathy or radiculopathy, lumbar region: Secondary | ICD-10-CM | POA: Diagnosis not present

## 2018-02-19 DIAGNOSIS — M47816 Spondylosis without myelopathy or radiculopathy, lumbar region: Secondary | ICD-10-CM | POA: Diagnosis not present

## 2018-03-08 DIAGNOSIS — H6692 Otitis media, unspecified, left ear: Secondary | ICD-10-CM | POA: Diagnosis not present

## 2018-03-10 DIAGNOSIS — M47816 Spondylosis without myelopathy or radiculopathy, lumbar region: Secondary | ICD-10-CM | POA: Diagnosis not present

## 2018-05-01 DIAGNOSIS — J019 Acute sinusitis, unspecified: Secondary | ICD-10-CM | POA: Diagnosis not present

## 2018-05-01 DIAGNOSIS — J209 Acute bronchitis, unspecified: Secondary | ICD-10-CM | POA: Diagnosis not present

## 2018-06-09 ENCOUNTER — Other Ambulatory Visit: Payer: Self-pay

## 2018-06-09 ENCOUNTER — Encounter: Payer: Self-pay | Admitting: Family Medicine

## 2018-06-09 ENCOUNTER — Ambulatory Visit: Payer: 59 | Admitting: Family Medicine

## 2018-06-09 VITALS — BP 142/84 | HR 71 | Temp 98.5°F | Ht 74.0 in | Wt 260.4 lb

## 2018-06-09 DIAGNOSIS — E785 Hyperlipidemia, unspecified: Secondary | ICD-10-CM | POA: Diagnosis not present

## 2018-06-09 DIAGNOSIS — Z7289 Other problems related to lifestyle: Secondary | ICD-10-CM

## 2018-06-09 DIAGNOSIS — Z789 Other specified health status: Secondary | ICD-10-CM

## 2018-06-09 LAB — HEPATIC FUNCTION PANEL
ALT: 24 U/L (ref 0–53)
AST: 19 U/L (ref 0–37)
Albumin: 4.3 g/dL (ref 3.5–5.2)
Alkaline Phosphatase: 47 U/L (ref 39–117)
BILIRUBIN DIRECT: 0.1 mg/dL (ref 0.0–0.3)
BILIRUBIN TOTAL: 0.6 mg/dL (ref 0.2–1.2)
Total Protein: 6.8 g/dL (ref 6.0–8.3)

## 2018-06-09 LAB — LIPID PANEL
CHOLESTEROL: 236 mg/dL — AB (ref 0–200)
HDL: 37.6 mg/dL — AB (ref >39.00)
LDL CALC: 160 mg/dL — AB (ref 0–99)
NonHDL: 198.1
Total CHOL/HDL Ratio: 6
Triglycerides: 193 mg/dL — ABNORMAL HIGH (ref 0.0–149.0)
VLDL: 38.6 mg/dL (ref 0.0–40.0)

## 2018-06-09 MED ORDER — PRAVASTATIN SODIUM 20 MG PO TABS: 40.0000 mg | ORAL_TABLET | Freq: Every day | ORAL | Status: DC

## 2018-06-09 NOTE — Patient Instructions (Addendum)
Go to the lab on the way out.  We'll contact you with your lab report. Don't change your meds for now.  Thanks for your effort.  Take care.  Glad to see you.

## 2018-06-09 NOTE — Progress Notes (Signed)
He has been able to tolerate 40mg  of pravastatin.  No ADE on med. Labs pending.  See notes on labs.  Rare use of SABA.    He has been on the road, travelling internationally.  That affected his diet.  Now he isn't travelling and that should be a good change for patient.  He is in the habit of drinking alcohol at night, this was mainly on the road when traveling with work.  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  He last drank 4 nights ago.  No w/d sx.  He is home with family.  No cravings for alcohol.    He had prev quit smoking so he is familiar with behavior change.    No charges previously or now.  Safe at home.  Mood is good.  No SI/HI.   2/4 CAGE.    Meds, vitals, and allergies reviewed.   ROS: Per HPI unless specifically indicated in ROS section   GEN: nad, alert and oriented HEENT: mucous membranes moist NECK: supple w/o LA CV: rrr. PULM: ctab, no inc wob ABD: soft, +bs EXT: no edema SKIN: no acute rash

## 2018-06-11 DIAGNOSIS — Z789 Other specified health status: Secondary | ICD-10-CM | POA: Insufficient documentation

## 2018-06-11 DIAGNOSIS — F1090 Alcohol use, unspecified, uncomplicated: Secondary | ICD-10-CM | POA: Insufficient documentation

## 2018-06-11 DIAGNOSIS — Z7289 Other problems related to lifestyle: Secondary | ICD-10-CM | POA: Insufficient documentation

## 2018-06-11 NOTE — Assessment & Plan Note (Signed)
See notes on labs. 

## 2018-06-11 NOTE — Assessment & Plan Note (Signed)
It sounds like he had alcohol use that was socially accepted with meals and evening social engagements related to work.  Now that he is back home he wants to cease alcohol.  He is out of his previous work environment.  He has no high risk behaviors otherwise.  No history of DTs.  No charges pending.  He has a safe and supportive home environment.  We talked about options.  He did not really want to go to meetings.  It does not sound like he needs to go to meetings at this point.  He is motivated.  He was asking about medications that can help with abstinence but these are likely not needed for a motivated patient in a supportive environment in the first place.  We agreed to check his liver labs and go from there.  He will update me as needed.

## 2018-06-30 ENCOUNTER — Telehealth: Payer: Self-pay | Admitting: Family Medicine

## 2018-06-30 MED ORDER — BECLOMETHASONE DIPROPIONATE 80 MCG/ACT IN AERS: INHALATION_SPRAY | RESPIRATORY_TRACT | 12 refills | Status: DC

## 2018-06-30 NOTE — Telephone Encounter (Signed)
Rx sent electronically.  

## 2018-06-30 NOTE — Telephone Encounter (Signed)
Pt need new prescription for Qvar   sent to [facility]

## 2018-07-11 ENCOUNTER — Other Ambulatory Visit: Payer: Self-pay | Admitting: Family Medicine

## 2018-11-11 ENCOUNTER — Telehealth: Payer: Self-pay | Admitting: Family Medicine

## 2018-11-11 DIAGNOSIS — E785 Hyperlipidemia, unspecified: Secondary | ICD-10-CM

## 2018-11-11 NOTE — Telephone Encounter (Signed)
Patient's wife,Cristy,called to get refills on his medication. She said [facility] won't request the refills.  Patient needs Pravastatin 20 mg, Lisinopril 10 mg. Both medications have no refills.

## 2018-11-12 MED ORDER — PRAVASTATIN SODIUM 20 MG PO TABS: 40.0000 mg | ORAL_TABLET | Freq: Every day | ORAL | 3 refills | Status: DC

## 2018-11-12 MED ORDER — LISINOPRIL 10 MG PO TABS: 10.0000 mg | ORAL_TABLET | Freq: Every day | ORAL | 3 refills | Status: DC

## 2018-11-12 NOTE — Telephone Encounter (Signed)
When does patient need to follow up? LOV 05/30/2018. Last CPE was on 10/07/2017. Refills sent

## 2018-11-13 NOTE — Telephone Encounter (Signed)
Patient advised and appointments for CPE and labs scheduled

## 2018-11-13 NOTE — Telephone Encounter (Signed)
Reasonable for CPE this fall when possible.  Thanks.

## 2019-01-13 ENCOUNTER — Other Ambulatory Visit: Payer: 59

## 2019-01-14 ENCOUNTER — Other Ambulatory Visit: Payer: Self-pay

## 2019-01-14 ENCOUNTER — Other Ambulatory Visit: Payer: Self-pay | Admitting: Family Medicine

## 2019-01-14 ENCOUNTER — Telehealth: Payer: Self-pay

## 2019-01-14 ENCOUNTER — Other Ambulatory Visit (INDEPENDENT_AMBULATORY_CARE_PROVIDER_SITE_OTHER): Payer: 59

## 2019-01-14 DIAGNOSIS — E785 Hyperlipidemia, unspecified: Secondary | ICD-10-CM

## 2019-01-14 LAB — COMPREHENSIVE METABOLIC PANEL
ALT: 20 U/L (ref 0–53)
AST: 14 U/L (ref 0–37)
Albumin: 4.1 g/dL (ref 3.5–5.2)
Alkaline Phosphatase: 48 U/L (ref 39–117)
BUN: 19 mg/dL (ref 6–23)
CO2: 31 mEq/L (ref 19–32)
Calcium: 9.2 mg/dL (ref 8.4–10.5)
Chloride: 103 mEq/L (ref 96–112)
Creatinine, Ser: 0.92 mg/dL (ref 0.40–1.50)
GFR: 85.87 mL/min (ref >60.00)
Glucose, Bld: 97 mg/dL (ref 70–99)
Potassium: 4.2 mEq/L (ref 3.5–5.1)
Sodium: 139 mEq/L (ref 135–145)
Total Bilirubin: 0.7 mg/dL (ref 0.2–1.2)
Total Protein: 6 g/dL (ref 6.0–8.3)

## 2019-01-14 LAB — LIPID PANEL
Cholesterol: 252 mg/dL — ABNORMAL HIGH (ref 0–200)
HDL: 34.4 mg/dL — ABNORMAL LOW (ref >39.00)
NonHDL: 217.54
Total CHOL/HDL Ratio: 7
Triglycerides: 253 mg/dL — ABNORMAL HIGH (ref 0.0–149.0)
VLDL: 50.6 mg/dL — ABNORMAL HIGH (ref 0.0–40.0)

## 2019-01-14 LAB — LDL CHOLESTEROL, DIRECT: Direct LDL: 171 mg/dL

## 2019-01-14 NOTE — Telephone Encounter (Signed)
[place] Night - Client Nonclinical Telephone Record AccessNurse Client [place] Night - Client Client Site [place] - Night Contact Type Call Who Is Calling Patient / Member / Family / Caregiver Caller Name Mason Phone Number [phone] Patient Name Dean Bishop Patient DOB Oct 12, 1965 Call Type Message Only Information Provided Reason for Call Request for General Office Information Initial Comment Regginald Pask is outside waiting for blood work Additional Comment Call Closed By: Felecia Jan Transaction Date/Time: 01/14/2019 7:50:50 AM (ET)

## 2019-01-14 NOTE — Telephone Encounter (Signed)
Per lab tab pts labs have already been collected this morning.

## 2019-01-20 ENCOUNTER — Ambulatory Visit (INDEPENDENT_AMBULATORY_CARE_PROVIDER_SITE_OTHER): Payer: 59 | Admitting: Family Medicine

## 2019-01-20 ENCOUNTER — Encounter: Payer: Self-pay | Admitting: Family Medicine

## 2019-01-20 ENCOUNTER — Other Ambulatory Visit: Payer: Self-pay

## 2019-01-20 VITALS — BP 126/76 | HR 86 | Temp 97.8°F | Ht 74.0 in | Wt 255.1 lb

## 2019-01-20 DIAGNOSIS — Z23 Encounter for immunization: Secondary | ICD-10-CM

## 2019-01-20 DIAGNOSIS — Z0000 Encounter for general adult medical examination without abnormal findings: Secondary | ICD-10-CM | POA: Diagnosis not present

## 2019-01-20 DIAGNOSIS — Z7189 Other specified counseling: Secondary | ICD-10-CM

## 2019-01-20 DIAGNOSIS — Z1211 Encounter for screening for malignant neoplasm of colon: Secondary | ICD-10-CM

## 2019-01-20 DIAGNOSIS — J45909 Unspecified asthma, uncomplicated: Secondary | ICD-10-CM

## 2019-01-20 DIAGNOSIS — R454 Irritability and anger: Secondary | ICD-10-CM

## 2019-01-20 DIAGNOSIS — E785 Hyperlipidemia, unspecified: Secondary | ICD-10-CM

## 2019-01-20 DIAGNOSIS — I10 Essential (primary) hypertension: Secondary | ICD-10-CM

## 2019-01-20 DIAGNOSIS — Z0001 Encounter for general adult medical examination with abnormal findings: Secondary | ICD-10-CM

## 2019-01-20 MED ORDER — FLUOXETINE HCL 10 MG PO CAPS: 10.0000 mg | ORAL_CAPSULE | Freq: Every day | ORAL | 3 refills | Status: DC

## 2019-01-20 MED ORDER — PRAVASTATIN SODIUM 20 MG PO TABS: 60.0000 mg | ORAL_TABLET | Freq: Every day | ORAL | 3 refills | Status: DC

## 2019-01-20 NOTE — Patient Instructions (Addendum)
Check with your insurance to see if they will cover the shingrix shot. Go to the lab on the way out (stool cards).  We'll contact you with your lab report. PNA 23 shot today.  Thanks for getting a flu shot.    Try increasing pravastatin to 60mg  a day.  If tolerated, then recheck lipids in about 3 months.  If you don't tolerate it, then cut back to 40mg  a day and let me know.

## 2019-01-20 NOTE — Progress Notes (Signed)
CPE- See plan.  Routine anticipatory guidance given to patient.  See health maintenance.  The possibility exists that previously documented standard health maintenance information may have been brought forward from a previous encounter into this note.  If needed, that same information has been updated to reflect the current situation based on today's encounter.    Tetanus 2011 Flu prev done PNA 23 2020.  Shingles dw pt.   see avs.  Prostate cancer screening and PSA options(with potential risks and benefits of testing vs not testing) were discussed along with recent recs/guidelines. He declined testing PSAat this point. D/w patient [id number] for colon cancer screening, including IFOB vs. colonoscopy. Risks and benefits of both were discussed and patient voiced understanding. Pt elects for IFOB.  [facility] pt. Done 2017.  Living will d/w pt. Wife designated if patient were incapacitated.  Diet and exercise d/w pt.    Hypertension:    Using medication without problems or lightheadedness: yes Chest pain with exertion:no Edema:no Short of breath:no Labs d/w pt.   Asthma.  Using QVAR seasonally and with known exposures at work.  When used, he doesn't usually need SABA.  No ADE on QVAR.   Elevated Cholesterol: Using medications without problems: yes Muscle aches: no Diet compliance: encouraged.  Exercise: encouraged  Mood d/w pt. No ADE on SSRI, compliant.  It helps with mood.   No SI/HI.    PMH and SH reviewed  Meds, vitals, and allergies reviewed.   ROS: Per HPI.  Unless specifically indicated otherwise in HPI, the patient denies:  General: fever. Eyes: acute vision changes ENT: sore throat Cardiovascular: chest pain Respiratory: SOB GI: vomiting GU: dysuria Musculoskeletal: acute back pain Derm: acute rash Neuro: acute motor dysfunction Psych: worsening mood Endocrine: polydipsia Heme: bleeding Allergy: hayfever  GEN: nad, alert  and oriented HEENT: ncat NECK: supple w/o LA CV: rrr. PULM: ctab, no inc wob ABD: soft, +bs EXT: no edema SKIN: no acute rash

## 2019-01-21 NOTE — Assessment & Plan Note (Signed)
No change in meds.  Continue work on diet and exercise.  Labs discussed with patient.  He agrees. 

## 2019-01-21 NOTE — Assessment & Plan Note (Signed)
Living will d/w pt.  Wife designated if patient were incapacitated.   ?

## 2019-01-21 NOTE — Assessment & Plan Note (Signed)
Using QVAR seasonally and with known exposures at work.  When used, he doesn't usually need SABA.  No ADE on QVAR.  Continue as is.  He agrees.

## 2019-01-21 NOTE — Assessment & Plan Note (Signed)
Tetanus 2011 Flu prev done PNA 23 2020.  Shingles dw pt.   see avs.  Prostate cancer screening and PSA options(with potential risks and benefits of testing vs not testing) were discussed along with recent recs/guidelines. He declined testing PSAat this point. D/w patient [id number] for colon cancer screening, including IFOB vs. colonoscopy. Risks and benefits of both were discussed and patient voiced understanding. Pt elects for IFOB.  [facility] pt. Done 2017.  Living will d/w pt. Wife designated if patient were incapacitated.  Diet and exercise d/w pt.

## 2019-01-21 NOTE — Assessment & Plan Note (Addendum)
Continue work on diet and exercise.   He can try increasing pravastatin to 60mg  a day.  If tolerated, then recheck lipids in about 3 months.  If not tolerated, then cut back to 40mg  a day and he will let me know.

## 2019-01-21 NOTE — Assessment & Plan Note (Signed)
Mood d/w pt. No ADE on SSRI, compliant.  It helps with mood.   No SI/HI.   Continue as is.  He agrees.

## 2019-01-26 ENCOUNTER — Other Ambulatory Visit: Payer: Self-pay | Admitting: Family Medicine

## 2019-02-02 ENCOUNTER — Encounter: Payer: Self-pay | Admitting: Family Medicine

## 2019-02-02 ENCOUNTER — Other Ambulatory Visit: Payer: Self-pay

## 2019-02-02 ENCOUNTER — Ambulatory Visit: Payer: 59 | Admitting: Family Medicine

## 2019-02-02 VITALS — BP 142/80 | HR 81 | Temp 97.6°F | Ht 74.0 in | Wt 253.5 lb

## 2019-02-02 DIAGNOSIS — M545 Low back pain, unspecified: Secondary | ICD-10-CM

## 2019-02-02 MED ORDER — HYDROCODONE-ACETAMINOPHEN 5-325 MG PO TABS: 0.5000 | ORAL_TABLET | Freq: Four times a day (QID) | ORAL | 0 refills | Status: DC | PRN

## 2019-02-02 MED ORDER — PREDNISONE 20 MG PO TABS: ORAL_TABLET | ORAL | 0 refills | Status: DC

## 2019-02-02 NOTE — Progress Notes (Signed)
Back pain started yesterday afternoon.  More pain standing, less sitting, least laying down.  Sig pain when kneeling down to work on [employer].  He has h/o similar in the past. Started taking leftover hydrocodone and prednisone.  He has seen ortho in the past.  H/o back injections in the past w/o relief.  Central lower back pain.  Radiating to B sides equally but not down the legs.  Worst pain in the AMs, when he is trying to get out of bed.  Ice helps more than heat.  TENS unit helped previously.  Discussed options.  Meds, vitals, and allergies reviewed.   ROS: Per HPI unless specifically indicated in ROS section   nad ncat Neck supple, no LA rrr ctab abd soft, not ttp Ext w/o edema.  Walking with a limp. Lower back tender to palpation. S/S wnl BLE  Skin without bruising or rash.

## 2019-02-02 NOTE — Patient Instructions (Signed)
Use the pain meds as needed, sedation caution on hydrocodone and take prednisone with food.  Use ice as needed.  See about getting a TENS unit to try and see if that helps.  Keep trying to gently stretch.  Update me as needed.  Take care.  Glad to see you.

## 2019-02-04 NOTE — Assessment & Plan Note (Signed)
He has significant pain but no emergent symptoms that would need imaging or further work-up right now.  Reasonable to use hydrocodone with routine sedation caution.  Start prednisone with steroid caution.  Gently stretch.  Use ice as needed.  Prescription written for patient for TENS unit.  He has used this in the past with relief.  He can try to get one at a medical supply store.  He will update me as needed.  At this point still okay for outpatient follow-up and he agrees with plan.

## 2019-05-27 ENCOUNTER — Other Ambulatory Visit: Payer: Self-pay | Admitting: Family Medicine

## 2019-05-27 DIAGNOSIS — E785 Hyperlipidemia, unspecified: Secondary | ICD-10-CM

## 2019-05-27 MED ORDER — PRAVASTATIN SODIUM 20 MG PO TABS: 60.0000 mg | ORAL_TABLET | Freq: Every day | ORAL | 3 refills | Status: DC

## 2019-05-27 NOTE — Telephone Encounter (Signed)
Patient's wife, Cristy,called.  Patient needs a refill on Pravastatin.  Patient wants to change from [facility] to [facility] [employer]. Patient needs a 90 day supply. Patient has 5 days of medication left.

## 2019-05-27 NOTE — Telephone Encounter (Signed)
Pt has refills at [facility] but now wants the script sent to [facility], [address], [place]. Last OV 02/02/19 Sent in refills. Contacted pt's wife, Neysa Bonito, and advised pt needs apt to check cholesterol. Advised an OV would be best. Neysa Bonito agreed.  Scheduled pt for f/u OV.

## 2019-05-29 ENCOUNTER — Ambulatory Visit: Payer: 59 | Admitting: Family Medicine

## 2019-06-05 ENCOUNTER — Encounter: Payer: Self-pay | Admitting: Family Medicine

## 2019-06-05 ENCOUNTER — Other Ambulatory Visit: Payer: Self-pay

## 2019-06-05 ENCOUNTER — Ambulatory Visit (INDEPENDENT_AMBULATORY_CARE_PROVIDER_SITE_OTHER): Payer: 59 | Admitting: Family Medicine

## 2019-06-05 DIAGNOSIS — E785 Hyperlipidemia, unspecified: Secondary | ICD-10-CM | POA: Diagnosis not present

## 2019-06-05 LAB — LIPID PANEL
Cholesterol: 234 mg/dL — ABNORMAL HIGH (ref 0–200)
HDL: 40.3 mg/dL (ref >39.00)
NonHDL: 193.44
Total CHOL/HDL Ratio: 6
Triglycerides: 267 mg/dL — ABNORMAL HIGH (ref 0.0–149.0)
VLDL: 53.4 mg/dL — ABNORMAL HIGH (ref 0.0–40.0)

## 2019-06-05 LAB — LDL CHOLESTEROL, DIRECT: Direct LDL: 168 mg/dL

## 2019-06-05 NOTE — Patient Instructions (Signed)
Don't change your meds for now.  Go to the lab on the way out.   If you have mychart we'll likely use that to update you.    Get the IFOB done when possible.  We'll see about options when I see your labs.  Take care.  Glad to see you.

## 2019-06-05 NOTE — Progress Notes (Signed)
This visit occurred during the SARS-CoV-2 public health emergency.  Safety protocols were in place, including screening questions prior to the visit, additional usage of staff PPE, and extensive cleaning of exam room while observing appropriate contact time as indicated for disinfecting solutions.  Elevated Cholesterol: Using medications without problems: yes Muscle aches: no Diet compliance: yes, doing better with diet than exercise.   Exercise: d/w pt.  Encouraged.  Discussed options.   He has IFOB to complete, d/w pt. He'll work on that.    Meds, vitals, and allergies reviewed.  ROS: Per HPI unless specifically indicated in ROS section   GEN: nad, alert and oriented HEENT: ncat NECK: supple w/o LA CV: rrr. PULM: ctab, no inc wob EXT: no edema SKIN:  Well perfused.   nonfasting labs pending.

## 2019-06-05 NOTE — Assessment & Plan Note (Signed)
nonfasting labs pending.   See notes on labs.  Currently on 60mg  pravastatin.  No change in meds yet.  D/w pt about diet and exercise.  No CP, SOB, BLE edema.   Discussed possible future lipid clinic referral.  We can consider in the future.

## 2019-06-07 ENCOUNTER — Other Ambulatory Visit: Payer: Self-pay | Admitting: Family Medicine

## 2019-06-07 DIAGNOSIS — E785 Hyperlipidemia, unspecified: Secondary | ICD-10-CM

## 2019-06-07 MED ORDER — PRAVASTATIN SODIUM 80 MG PO TABS: 80.0000 mg | ORAL_TABLET | Freq: Every day | ORAL | 3 refills | Status: DC

## 2019-06-08 ENCOUNTER — Other Ambulatory Visit: Payer: Self-pay | Admitting: *Deleted

## 2019-06-08 ENCOUNTER — Telehealth: Payer: Self-pay

## 2019-06-08 ENCOUNTER — Other Ambulatory Visit: Payer: Self-pay | Admitting: Family Medicine

## 2019-06-08 ENCOUNTER — Other Ambulatory Visit (INDEPENDENT_AMBULATORY_CARE_PROVIDER_SITE_OTHER): Payer: 59

## 2019-06-08 DIAGNOSIS — E785 Hyperlipidemia, unspecified: Secondary | ICD-10-CM

## 2019-06-08 DIAGNOSIS — Z1211 Encounter for screening for malignant neoplasm of colon: Secondary | ICD-10-CM | POA: Diagnosis not present

## 2019-06-08 DIAGNOSIS — R195 Other fecal abnormalities: Secondary | ICD-10-CM

## 2019-06-08 LAB — FECAL OCCULT BLOOD, IMMUNOCHEMICAL: Fecal Occult Bld: POSITIVE — AB

## 2019-06-08 MED ORDER — PRAVASTATIN SODIUM 80 MG PO TABS: 80.0000 mg | ORAL_TABLET | Freq: Every day | ORAL | 3 refills | Status: DC

## 2019-06-08 NOTE — Telephone Encounter (Signed)
See result note.  Thanks.  Referral to GI ordered.

## 2019-06-08 NOTE — Telephone Encounter (Signed)
[facility] called @0840  w critical results  Positive IFOB

## 2019-06-09 ENCOUNTER — Other Ambulatory Visit: Payer: Self-pay

## 2019-06-09 ENCOUNTER — Telehealth: Payer: Self-pay

## 2019-06-09 DIAGNOSIS — R195 Other fecal abnormalities: Secondary | ICD-10-CM

## 2019-06-09 NOTE — Telephone Encounter (Signed)
Gastroenterology Pre-Procedure Review  Request Date: Wed 06/24/19 Requesting Physician: Dr. Tobi Bastos  PATIENT REVIEW QUESTIONS: The patient responded to the following health history questions as indicated:    1. Are you having any GI issues? yes (Positive Fecal Occult Test) 2. Do you have a personal history of Polyps? no 3. Do you have a family history of Colon Cancer or Polyps? no 4. Diabetes Mellitus? no 5. Joint replacements in the past 12 months?no 6. Major health problems in the past 3 months?no 7. Any artificial heart valves, MVP, or defibrillator?no    MEDICATIONS & ALLERGIES:    Patient reports the following regarding taking any anticoagulation/antiplatelet therapy:   Plavix, Coumadin, Eliquis, Xarelto, Lovenox, Pradaxa, Brilinta, or Effient? no Aspirin? no  Patient confirms/reports the following medications:  Current Outpatient Medications  Medication Sig Dispense Refill  . albuterol (PROAIR HFA) 108 (90 Base) MCG/ACT inhaler INHALE 2 PUFFS EVERY 6HRS AS NEEDED FOR COUGH AND WHEEZING.  Disp ventolin inhaler 1 Inhaler 12  . azelastine (OPTIVAR) 0.05 % ophthalmic solution Place 1 drop into both eyes as needed.      . beclomethasone (QVAR) 80 MCG/ACT inhaler INHALE 1-2 PUFFS INTO THE LUNGS 2 (TWO) TIMES DAILY. 8.7 g 12  . FLUoxetine (PROZAC) 10 MG capsule TAKE 1 CAPSULE BY MOUTH  DAILY 90 capsule 3  . fluticasone (FLONASE) 50 MCG/ACT nasal spray Place 2 sprays into both nostrils daily. 16 g 12  . lisinopril (ZESTRIL) 10 MG tablet Take 1 tablet (10 mg total) by mouth daily. 90 tablet 3  . meclizine (ANTIVERT) 12.5 MG tablet Take 1-2 tablets (12.5-25 mg total) by mouth 3 (three) times daily as needed for dizziness. (Patient not taking: Reported on 06/05/2019) 30 tablet 3  . pravastatin (PRAVACHOL) 80 MG tablet Take 1 tablet (80 mg total) by mouth daily. 90 tablet 3   No current facility-administered medications for this visit.    Patient confirms/reports the following allergies:   Allergies  Allergen Reactions  . Advair Diskus [Fluticasone-Salmeterol]     Hoarse voice  . Lipitor [Atorvastatin Calcium] Other (See Comments)    aches    No orders of the defined types were placed in this encounter.   AUTHORIZATION INFORMATION Primary Insurance: 1D#: Group #:  Secondary Insurance: 1D#: Group #:  SCHEDULE INFORMATION: Date: Wed 06/24/19 Time: Location:[facility]

## 2019-06-11 ENCOUNTER — Telehealth: Payer: Self-pay

## 2019-06-11 NOTE — Telephone Encounter (Signed)
Patient returned call to schedule colonoscopy.  Colonoscopy has been rescheduled to 07/06/19 pt advised of COVID test 06/28/10.  New instructions mailed to patient.

## 2019-06-11 NOTE — Telephone Encounter (Signed)
Returned patients call to reschedule his colonoscopy from 06/24/19 with Dr. Tobi Bastos.  LVM for him to call back.  Thanks,  [place]

## 2019-07-02 ENCOUNTER — Other Ambulatory Visit: Admission: RE | Admit: 2019-07-02 | Payer: 59 | Source: Ambulatory Visit

## 2019-07-02 ENCOUNTER — Other Ambulatory Visit: Payer: Self-pay

## 2019-07-06 ENCOUNTER — Other Ambulatory Visit: Payer: Self-pay

## 2019-07-06 ENCOUNTER — Ambulatory Visit (INDEPENDENT_AMBULATORY_CARE_PROVIDER_SITE_OTHER): Payer: 59 | Admitting: Family Medicine

## 2019-07-06 ENCOUNTER — Encounter: Payer: Self-pay | Admitting: Family Medicine

## 2019-07-06 VITALS — BP 140/90 | HR 84 | Temp 98.2°F | Ht 74.0 in | Wt 259.0 lb

## 2019-07-06 DIAGNOSIS — M549 Dorsalgia, unspecified: Secondary | ICD-10-CM

## 2019-07-06 DIAGNOSIS — M48061 Spinal stenosis, lumbar region without neurogenic claudication: Secondary | ICD-10-CM

## 2019-07-06 MED ORDER — PREDNISONE 20 MG PO TABS: ORAL_TABLET | ORAL | 0 refills | Status: DC

## 2019-07-06 MED ORDER — ACETAMINOPHEN-CODEINE #3 300-30 MG PO TABS: 1.0000 | ORAL_TABLET | ORAL | 0 refills | Status: DC | PRN

## 2019-07-06 NOTE — Progress Notes (Signed)
Dean Jarvis T. Makalia Bare, MD, CAQ Sports Medicine  Primary Care and Sports Medicine [facility] [address] [place], [address]  Phone: [phone]  FAX: [phone]  Dean Bishop - 54 y.o. male  MRN [id number]  Date of Birth: 12-21-1965  Date: 07/06/2019  PCP: Joaquim Nam, MD  Referral: Joaquim Nam, MD  Chief Complaint  Patient presents with  . Back Pain    This visit occurred during the SARS-CoV-2 public health emergency.  Safety protocols were in place, including screening questions prior to the visit, additional usage of staff PPE, and extensive cleaning of exam room while observing appropriate contact time as indicated for disinfecting solutions.   Subjective:   Dean Bishop is a 54 y.o. very pleasant male patient who presents with the following: Back Pain  ongoing for approximately: 5 days The patient has had back pain before. The back pain is localized into the lumbar spine area. They also describe no radiculopathy.  He is having severe pain in his low back and this is limiting his function entirely.  He did have some Tylenol 3 and that did help somewhat at this point he is to having trouble getting up and rising from seated position and even doing some basic movement.  He has seen multiple providers for this including spine surgeons, and I did look at his prior MRI, and he does have some evidence of spinal stenosis.  This is in the region of L5.  No numbness or tingling. No bowel or bladder incontinence. No focal weakness. Prior interventions: He has had issues, taken some steroids in the past. Physical therapy: No Chiropractic manipulations: No Acupuncture: No Osteopathic manipulation: No Heat or cold: Minimal effect   Review of Systems is noted in the HPI, as appropriate  Objective:   Blood pressure 140/90, pulse 84, temperature 98.2 F (36.8 C), temperature source Temporal, height 6\' 2"  (1.88 m), weight 259  lb (117.5 kg), SpO2 96 %.  GEN: No acute distress; alert,appropriate. PULM: Breathing comfortably in no respiratory distress PSYCH: Normally interactive.   Range of motion at  the waist: Flexion, rotation and lateral bending: Able to forward flex to 40 degrees.  Extension causes pain.  Lateral bending and rotation causes significant pain.  No echymosis or edema Rises to examination table with no difficulty Gait: minimally antalgic  Inspection/Deformity: No abnormality Paraspinus T: Relatively diffuse  B Ankle Dorsiflexion (L5,4): 5/5 B Great Toe Dorsiflexion (L5,4): 5/5 Heel Walk (L5): WNL Toe Walk (S1): WNL Rise/Squat (L4): WNL, mild pain  SENSORY B Medial Foot (L4): WNL B Dorsum (L5): WNL B Lateral (S1): WNL Light Touch: WNL Pinprick: WNL  REFLEXES Knee (L4): 2+ Ankle (S1): 2+  B SLR, seated: Pain I was unable to have the patient reclined secondary to pain B Greater Troch: NT B Log Roll: neg  Radiology: No results found.  Assessment and Plan:     ICD-10-CM   1. Acute back pain, unspecified back location, unspecified back pain laterality  M54.9   2. Spinal stenosis of lumbar region, unspecified whether neurogenic claudication present  M48.061    Level of Medical Decision-Making in this case is MODERATE.  He is really in some severe pain right now.  I think that he is not can be able to do any significant rehab at this point.  Basic stretching only.  14 days of steroids with follow-up in 3 to 4 weeks.  Follow-up: Return in about 1 month (around  08/05/2019).  Meds ordered this encounter  Medications  . predniSONE (DELTASONE) 20 MG tablet    Sig: 2 tabs po for 7 days, then 1 tab po for 7 days    Dispense:  21 tablet    Refill:  0  . acetaminophen-codeine (TYLENOL #3) 300-30 MG tablet    Sig: Take 1 tablet by mouth every 4 (four) hours as needed for moderate pain.    Dispense:  20 tablet    Refill:  0   There are no discontinued medications. No orders of  the defined types were placed in this encounter.   Signed,  Maud Deed. Edrie Ehrich, MD   Outpatient Encounter Medications as of 07/06/2019  Medication Sig  . albuterol (PROAIR HFA) 108 (90 Base) MCG/ACT inhaler INHALE 2 PUFFS EVERY 6HRS AS NEEDED FOR COUGH AND WHEEZING.  Disp ventolin inhaler  . azelastine (OPTIVAR) 0.05 % ophthalmic solution Place 1 drop into both eyes as needed.    . beclomethasone (QVAR) 80 MCG/ACT inhaler INHALE 1-2 PUFFS INTO THE LUNGS 2 (TWO) TIMES DAILY.  Marland Kitchen FLUoxetine (PROZAC) 10 MG capsule TAKE 1 CAPSULE BY MOUTH  DAILY  . fluticasone (FLONASE) 50 MCG/ACT nasal spray Place 2 sprays into both nostrils daily.  Marland Kitchen lisinopril (ZESTRIL) 10 MG tablet Take 1 tablet (10 mg total) by mouth daily.  . meclizine (ANTIVERT) 12.5 MG tablet Take 1-2 tablets (12.5-25 mg total) by mouth 3 (three) times daily as needed for dizziness.  . pravastatin (PRAVACHOL) 80 MG tablet Take 1 tablet (80 mg total) by mouth daily.  Marland Kitchen acetaminophen-codeine (TYLENOL #3) 300-30 MG tablet Take 1 tablet by mouth every 4 (four) hours as needed for moderate pain.  . predniSONE (DELTASONE) 20 MG tablet 2 tabs po for 7 days, then 1 tab po for 7 days   No facility-administered encounter medications on file as of 07/06/2019.

## 2019-08-21 ENCOUNTER — Telehealth: Payer: Self-pay

## 2019-08-21 DIAGNOSIS — R195 Other fecal abnormalities: Secondary | ICD-10-CM

## 2019-08-21 NOTE — Telephone Encounter (Signed)
Returned patients call.  Contacted his wife at his home.  She said that her husband wanted to reschedule colonoscopy due to graduations, and vacations.  She has rescheduled for him due to he was at work.  Rescheduled to Monday 11/09/19 still with Dr. Tobi Bastos.  Trish in Endo notified.  Referral updated.  New instructions will be mailed.  Thanks,  [place], [place]

## 2019-08-27 ENCOUNTER — Other Ambulatory Visit: Admission: RE | Admit: 2019-08-27 | Payer: 59 | Source: Ambulatory Visit

## 2019-09-08 ENCOUNTER — Ambulatory Visit (INDEPENDENT_AMBULATORY_CARE_PROVIDER_SITE_OTHER): Payer: 59 | Admitting: Family Medicine

## 2019-09-08 ENCOUNTER — Other Ambulatory Visit: Payer: Self-pay

## 2019-09-08 ENCOUNTER — Encounter: Payer: Self-pay | Admitting: Family Medicine

## 2019-09-08 ENCOUNTER — Other Ambulatory Visit (INDEPENDENT_AMBULATORY_CARE_PROVIDER_SITE_OTHER): Payer: 59

## 2019-09-08 VITALS — BP 130/84 | HR 76 | Temp 97.6°F | Ht 74.0 in | Wt 255.4 lb

## 2019-09-08 DIAGNOSIS — E785 Hyperlipidemia, unspecified: Secondary | ICD-10-CM | POA: Diagnosis not present

## 2019-09-08 DIAGNOSIS — M25521 Pain in right elbow: Secondary | ICD-10-CM

## 2019-09-08 DIAGNOSIS — M25529 Pain in unspecified elbow: Secondary | ICD-10-CM | POA: Diagnosis not present

## 2019-09-08 LAB — LIPID PANEL
Cholesterol: 225 mg/dL — ABNORMAL HIGH (ref 0–200)
HDL: 35.7 mg/dL — ABNORMAL LOW (ref >39.00)
NonHDL: 188.82
Total CHOL/HDL Ratio: 6
Triglycerides: 316 mg/dL — ABNORMAL HIGH (ref 0.0–149.0)
VLDL: 63.2 mg/dL — ABNORMAL HIGH (ref 0.0–40.0)

## 2019-09-08 LAB — LDL CHOLESTEROL, DIRECT: Direct LDL: 146 mg/dL

## 2019-09-08 NOTE — Patient Instructions (Signed)
This could be tennis elbow.  Xray if not better.   Ice as needed.  Hold pravastatin for now and see if that helps.   A tennis elbow strap may help.  Take care.  Glad to see you. Update me next week.

## 2019-09-08 NOTE — Progress Notes (Signed)
This visit occurred during the SARS-CoV-2 public health emergency.  Safety protocols were in place, including screening questions prior to the visit, additional usage of staff PPE, and extensive cleaning of exam room while observing appropriate contact time as indicated for disinfecting solutions.  He is taking pravastatin.  He had covid vaccine.  He is having pain with grip with either hand.  Pain isn't at the hand but at B lateral elbows.  Pain noted in the last 2 months.    He isn't having much pain o/w "but those have always been there" meaning some knee and back discomfort.    No FCNAVD.  Glucosamine didn't help.    Meds, vitals, and allergies reviewed.   ROS: Per HPI unless specifically indicated in ROS section   nad ncat Neck supple, no LA Bilateral shoulder range of motion intact. Normal flexion and extension of the elbows without restriction No L elbow pain on testing but R lateral elbow pain with grip, not better with forearm compression.   Olecranon not tender to palpation bilaterally. Forearm not tender to palpation bilaterally otherwise. Distally NV intact B.  Intact grip bilaterally.

## 2019-09-09 DIAGNOSIS — M25529 Pain in unspecified elbow: Secondary | ICD-10-CM | POA: Insufficient documentation

## 2019-09-09 NOTE — Assessment & Plan Note (Signed)
Discussed options. This could be tennis elbow.  He can come back for an x-ray if not better. Advised to use ice as needed.  Hold pravastatin for now and see if that helps.  A tennis elbow strap may help.  He can update me next week.  He agrees with plan.

## 2019-10-27 ENCOUNTER — Other Ambulatory Visit: Payer: Self-pay

## 2019-12-16 ENCOUNTER — Other Ambulatory Visit: Payer: Self-pay | Admitting: Family Medicine

## 2019-12-16 NOTE — Telephone Encounter (Signed)
Spouse called to get refill on  Meclizine  Pt has 2 pills  [facility] [address] [place]

## 2019-12-17 ENCOUNTER — Other Ambulatory Visit: Payer: Self-pay | Admitting: *Deleted

## 2019-12-17 MED ORDER — MECLIZINE HCL 12.5 MG PO TABS: 12.5000 mg | ORAL_TABLET | Freq: Three times a day (TID) | ORAL | 3 refills | Status: DC | PRN

## 2019-12-17 NOTE — Telephone Encounter (Signed)
Rx request submitted to Dr. Para March for approval.

## 2019-12-17 NOTE — Telephone Encounter (Signed)
Wife called for refill on Meclizine.  States he only has 2 pills left. Last office visit:   09/08/2019 Last Filled:    30 tablet 3 03/27/2017

## 2019-12-17 NOTE — Telephone Encounter (Signed)
Sent. Thanks.   

## 2019-12-28 ENCOUNTER — Telehealth: Payer: Self-pay

## 2019-12-28 ENCOUNTER — Other Ambulatory Visit
Admission: RE | Admit: 2019-12-28 | Discharge: 2019-12-28 | Disposition: A | Discharge status: Home / Self Care | Payer: 59 | Source: Ambulatory Visit | Attending: Gastroenterology | Admitting: Gastroenterology

## 2019-12-28 ENCOUNTER — Other Ambulatory Visit: Payer: Self-pay

## 2019-12-28 DIAGNOSIS — Z20822 Contact with and (suspected) exposure to covid-19: Secondary | ICD-10-CM | POA: Insufficient documentation

## 2019-12-28 DIAGNOSIS — Z01812 Encounter for preprocedural laboratory examination: Secondary | ICD-10-CM | POA: Diagnosis not present

## 2019-12-28 LAB — SARS CORONAVIRUS 2 (TAT 6-24 HRS): SARS Coronavirus 2: NEGATIVE

## 2019-12-28 NOTE — Telephone Encounter (Signed)
Patient called to cancel appointment for 12/30/19. He states the cost of the procedure is too high. Did not say if he plans to reschedule later on.

## 2019-12-30 ENCOUNTER — Ambulatory Visit: Admission: RE | Admit: 2019-12-30 | Payer: 59 | Source: Home / Self Care | Admitting: Gastroenterology

## 2019-12-30 ENCOUNTER — Encounter: Admission: RE | Payer: Self-pay | Source: Home / Self Care

## 2019-12-30 SURGERY — COLONOSCOPY WITH PROPOFOL
Anesthesia: General

## 2020-02-29 ENCOUNTER — Other Ambulatory Visit: Payer: Self-pay | Admitting: Family Medicine

## 2020-03-11 ENCOUNTER — Telehealth: Payer: Self-pay | Admitting: Family Medicine

## 2020-03-11 MED ORDER — LISINOPRIL 10 MG PO TABS: 10.0000 mg | ORAL_TABLET | Freq: Every day | ORAL | 1 refills | Status: DC

## 2020-03-11 NOTE — Telephone Encounter (Signed)
Pt wife called in wantd to now about getting lisinopril 90 day supply and send it to [facility] [address]

## 2020-03-11 NOTE — Telephone Encounter (Signed)
Pleaser Advise. Ok for 90 day supply

## 2020-03-11 NOTE — Telephone Encounter (Signed)
Sent. Thanks.  Please have him check with GI about rescheduling his colonoscopy when possible.

## 2020-03-14 ENCOUNTER — Telehealth: Payer: Self-pay

## 2020-03-14 NOTE — Telephone Encounter (Signed)
Spoke with wife Aline Brochure and she stated that she would relay the message to her husband to schedule a colonoscopy when he returns. He is currently working out of town

## 2020-04-11 ENCOUNTER — Other Ambulatory Visit: Payer: Self-pay | Admitting: Family Medicine

## 2020-04-11 DIAGNOSIS — E785 Hyperlipidemia, unspecified: Secondary | ICD-10-CM

## 2020-04-12 ENCOUNTER — Other Ambulatory Visit: Payer: 59

## 2020-04-14 ENCOUNTER — Other Ambulatory Visit (INDEPENDENT_AMBULATORY_CARE_PROVIDER_SITE_OTHER): Payer: 59

## 2020-04-14 ENCOUNTER — Other Ambulatory Visit: Payer: Self-pay

## 2020-04-14 DIAGNOSIS — E785 Hyperlipidemia, unspecified: Secondary | ICD-10-CM | POA: Diagnosis not present

## 2020-04-14 LAB — COMPREHENSIVE METABOLIC PANEL
ALT: 20 U/L (ref 0–53)
AST: 15 U/L (ref 0–37)
Albumin: 4.2 g/dL (ref 3.5–5.2)
Alkaline Phosphatase: 42 U/L (ref 39–117)
BUN: 17 mg/dL (ref 6–23)
CO2: 30 mEq/L (ref 19–32)
Calcium: 9.3 mg/dL (ref 8.4–10.5)
Chloride: 104 mEq/L (ref 96–112)
Creatinine, Ser: 0.95 mg/dL (ref 0.40–1.50)
GFR: 90.55 mL/min (ref >60.00)
Glucose, Bld: 92 mg/dL (ref 70–99)
Potassium: 4.4 mEq/L (ref 3.5–5.1)
Sodium: 138 mEq/L (ref 135–145)
Total Bilirubin: 0.5 mg/dL (ref 0.2–1.2)
Total Protein: 6.7 g/dL (ref 6.0–8.3)

## 2020-04-14 LAB — LDL CHOLESTEROL, DIRECT: Direct LDL: 174 mg/dL

## 2020-04-14 LAB — LIPID PANEL
Cholesterol: 264 mg/dL — ABNORMAL HIGH (ref 0–200)
HDL: 38.4 mg/dL — ABNORMAL LOW (ref >39.00)
NonHDL: 225.28
Total CHOL/HDL Ratio: 7
Triglycerides: 249 mg/dL — ABNORMAL HIGH (ref 0.0–149.0)
VLDL: 49.8 mg/dL — ABNORMAL HIGH (ref 0.0–40.0)

## 2020-04-21 ENCOUNTER — Encounter: Payer: Self-pay | Admitting: Family Medicine

## 2020-04-21 ENCOUNTER — Ambulatory Visit (INDEPENDENT_AMBULATORY_CARE_PROVIDER_SITE_OTHER): Payer: 59 | Admitting: Family Medicine

## 2020-04-21 ENCOUNTER — Other Ambulatory Visit: Payer: Self-pay

## 2020-04-21 VITALS — BP 124/90 | HR 67 | Temp 97.1°F | Ht 74.0 in | Wt 252.0 lb

## 2020-04-21 DIAGNOSIS — Z0001 Encounter for general adult medical examination with abnormal findings: Secondary | ICD-10-CM

## 2020-04-21 DIAGNOSIS — R454 Irritability and anger: Secondary | ICD-10-CM

## 2020-04-21 DIAGNOSIS — J45909 Unspecified asthma, uncomplicated: Secondary | ICD-10-CM

## 2020-04-21 DIAGNOSIS — Z23 Encounter for immunization: Secondary | ICD-10-CM | POA: Diagnosis not present

## 2020-04-21 DIAGNOSIS — Z7189 Other specified counseling: Secondary | ICD-10-CM

## 2020-04-21 DIAGNOSIS — Z0000 Encounter for general adult medical examination without abnormal findings: Secondary | ICD-10-CM | POA: Diagnosis not present

## 2020-04-21 DIAGNOSIS — R195 Other fecal abnormalities: Secondary | ICD-10-CM

## 2020-04-21 DIAGNOSIS — M25529 Pain in unspecified elbow: Secondary | ICD-10-CM

## 2020-04-21 DIAGNOSIS — I10 Essential (primary) hypertension: Secondary | ICD-10-CM

## 2020-04-21 DIAGNOSIS — E785 Hyperlipidemia, unspecified: Secondary | ICD-10-CM

## 2020-04-21 MED ORDER — MELOXICAM 15 MG PO TABS: 7.5000 mg | ORAL_TABLET | Freq: Every day | ORAL | 0 refills | Status: DC | PRN

## 2020-04-21 MED ORDER — PRAVASTATIN SODIUM 80 MG PO TABS: 40.0000 mg | ORAL_TABLET | Freq: Every day | ORAL | Status: DC

## 2020-04-21 MED ORDER — LISINOPRIL 10 MG PO TABS: 10.0000 mg | ORAL_TABLET | Freq: Every day | ORAL | 3 refills | Status: DC

## 2020-04-21 NOTE — Patient Instructions (Addendum)
Restart 1/2 tab pravastatin and let me know how that goes.  Use meloxicam if needed with food.   Take care.  Glad to see you. Keep working on diet and exercise.

## 2020-04-21 NOTE — Progress Notes (Signed)
This visit occurred during the SARS-CoV-2 public health emergency.  Safety protocols were in place, including screening questions prior to the visit, additional usage of staff PPE, and extensive cleaning of exam room while observing appropriate contact time as indicated for disinfecting solutions.  CPE- See plan.  Routine anticipatory guidance given to patient.  See health maintenance.  The possibility exists that previously documented standard health maintenance information may have been brought forward from a previous encounter into this note.  If needed, that same information has been updated to reflect the current situation based on today's encounter.    Tetanus 2022 Flu prev done PNA 23 2020.  Shingles dw pt.   covid vaccine up to date.  Prostate cancer screening and PSA options(with potential risks and benefits of testing vs not testing) were discussed along with recent recs/guidelines. He declined testing PSAat this point. D/w pt about prev IFOB, he tried to schedule with GI.  Referral placed.   [facility] D/w pt. Done 2017.  Living will d/w pt. Wife designated if patient were incapacitated.  Diet and exercise d/w pt.   Meloxicam clearly helped his R elbow.  D/w pt about prn use.  No other nsaid use.    He hasn't needed allergy meds or inhalers with dec in dust exposure due to job change.    Hypertension:    Using medication without problems or lightheadedness: yes Chest pain with exertion: no Edema:no Short of breath:no BP checked at home.  Usually ~130/90.  We talked about retrial of statin to see if he could tolerate.    Mood d/w pt.  Still on prozac.  Mood is good.  Stressors d/w pt.  Home situation is good.  Kids are doing well.  He is working on diet, he started tracking his calories.  He is try to get some exercise.  D/w pt.    PMH and SH reviewed  Meds, vitals, and allergies reviewed.   ROS: Per HPI.  Unless specifically indicated  otherwise in HPI, the patient denies:  General: fever. Eyes: acute vision changes ENT: sore throat Cardiovascular: chest pain Respiratory: SOB GI: vomiting GU: dysuria Musculoskeletal: acute back pain Derm: acute rash Neuro: acute motor dysfunction Psych: worsening mood Endocrine: polydipsia Heme: bleeding Allergy: hayfever  GEN: nad, alert and oriented HEENT: ncat NECK: supple w/o LA CV: rrr. PULM: ctab, no inc wob ABD: soft, +bs EXT: no edema SKIN: no acute rash

## 2020-04-24 NOTE — Assessment & Plan Note (Signed)
Mood is good with Prozac.  Would continue as is.  No adverse effect on medication.  He agrees with plan.

## 2020-04-24 NOTE — Assessment & Plan Note (Signed)
We talked about retrial of statin to see if he could tolerate.   Restart pravastatin.

## 2020-04-24 NOTE — Assessment & Plan Note (Signed)
Living will d/w pt.  Wife designated if patient were incapacitated.   ?

## 2020-04-24 NOTE — Assessment & Plan Note (Signed)
Usually controlled on home check.  Continue lisinopril.  He will update me as needed.  Labs discussed with patient.

## 2020-04-24 NOTE — Assessment & Plan Note (Signed)
Clearly improved job change and less dust exposure.  He will update me as needed.

## 2020-04-24 NOTE — Assessment & Plan Note (Signed)
Tetanus 2022 Flu prev done PNA 23 2020.  Shingles dw pt.   covid vaccine up to date.  Prostate cancer screening and PSA options(with potential risks and benefits of testing vs not testing) were discussed along with recent recs/guidelines. He declined testing PSAat this point. D/w pt about prev IFOB, he tried to schedule with GI.  Referral placed.   [facility] D/w pt. Done 2017.  Living will d/w pt. Wife designated if patient were incapacitated.  Diet and exercise d/w pt.

## 2020-04-24 NOTE — Assessment & Plan Note (Signed)
Can use meloxicam as needed with routine cautions.  He agrees.

## 2020-05-16 ENCOUNTER — Telehealth: Payer: 59

## 2020-05-23 ENCOUNTER — Telehealth: Payer: 59

## 2020-05-23 ENCOUNTER — Telehealth: Payer: Self-pay

## 2020-05-23 NOTE — Telephone Encounter (Signed)
Pt has requested to cancel colonoscopy triage due to diagnosis for colonoscopy.  Referral indicated " Fecal occult blood test positive". Pt was advised that this would be the diagnosis used for his colonoscopy.  He requested to cancel for this reason.  Thanks,  [place], [place]

## 2020-07-12 ENCOUNTER — Telehealth: Payer: 59 | Admitting: Family Medicine

## 2020-08-12 ENCOUNTER — Other Ambulatory Visit: Payer: Self-pay | Admitting: Family Medicine

## 2020-08-12 DIAGNOSIS — E785 Hyperlipidemia, unspecified: Secondary | ICD-10-CM

## 2020-08-29 ENCOUNTER — Telehealth: Payer: Self-pay | Admitting: Family Medicine

## 2020-08-29 MED ORDER — ALBUTEROL SULFATE HFA 108 (90 BASE) MCG/ACT IN AERS: INHALATION_SPRAY | RESPIRATORY_TRACT | 12 refills | Status: DC

## 2020-08-29 NOTE — Addendum Note (Signed)
Addended by: Wendie Simmer B on: 08/29/2020 12:28 PM   Modules accepted: Orders

## 2020-08-29 NOTE — Telephone Encounter (Signed)
[facility] sent to [facility]

## 2020-08-29 NOTE — Telephone Encounter (Signed)
  LAST APPOINTMENT DATE: 08/12/2020   NEXT APPOINTMENT DATE:@Visit  date not found  MEDICATION: Albuterol    PHARMACY:  [facility] [address]   Let patient know to contact pharmacy at the end of the day to make sure medication is ready.  Please notify patient to allow 48-72 hours to process  Encourage patient to contact the pharmacy for refills or they can request refills through [facility]  CLINICAL FILLS OUT ALL BELOW:   LAST REFILL:  QTY:  REFILL DATE:    OTHER COMMENTS:    Okay for refill?  Please advise

## 2021-03-28 ENCOUNTER — Telehealth: Payer: Self-pay | Admitting: Family Medicine

## 2021-03-31 NOTE — Telephone Encounter (Signed)
Patient due for CPE at the end of January; please call to schedule.

## 2021-03-31 NOTE — Telephone Encounter (Signed)
Refill request for Fluoxetine 10 mg caps  LOV - 04/21/20 Next OV - not scheduled Last refill - 02/29/20 #90/3

## 2021-04-02 NOTE — Telephone Encounter (Signed)
Sent. Thanks.  ?Please schedule yearly visit when possible.  ?

## 2021-04-03 NOTE — Telephone Encounter (Signed)
Pt wife stated that she will get with him and give [place] a call back to schedule

## 2021-06-27 ENCOUNTER — Other Ambulatory Visit: Payer: Self-pay | Admitting: Family Medicine

## 2021-06-27 NOTE — Telephone Encounter (Signed)
Refill request for Lisinopril 10 mg tabs ? ?LOV - 04/21/20  ?Next OV - not scheduled but was asked to do so back in Jan and did not ?Last refill - 04/21/21 #90/3 ? ?

## 2021-06-28 NOTE — Telephone Encounter (Signed)
Sent rx with note to schedule.  Thanks.  ?

## 2021-09-11 ENCOUNTER — Other Ambulatory Visit: Payer: Self-pay | Admitting: Family Medicine

## 2021-09-11 DIAGNOSIS — E785 Hyperlipidemia, unspecified: Secondary | ICD-10-CM

## 2021-09-12 ENCOUNTER — Other Ambulatory Visit: Payer: Self-pay | Admitting: Family Medicine

## 2021-09-12 DIAGNOSIS — E785 Hyperlipidemia, unspecified: Secondary | ICD-10-CM

## 2021-09-13 ENCOUNTER — Other Ambulatory Visit (INDEPENDENT_AMBULATORY_CARE_PROVIDER_SITE_OTHER): Payer: 59

## 2021-09-13 DIAGNOSIS — E785 Hyperlipidemia, unspecified: Secondary | ICD-10-CM | POA: Diagnosis not present

## 2021-09-13 LAB — COMPREHENSIVE METABOLIC PANEL
ALT: 22 U/L (ref 0–53)
AST: 19 U/L (ref 0–37)
Albumin: 4.3 g/dL (ref 3.5–5.2)
Alkaline Phosphatase: 50 U/L (ref 39–117)
BUN: 18 mg/dL (ref 6–23)
CO2: 29 mEq/L (ref 19–32)
Calcium: 9.7 mg/dL (ref 8.4–10.5)
Chloride: 99 mEq/L (ref 96–112)
Creatinine, Ser: 0.91 mg/dL (ref 0.40–1.50)
GFR: 94.4 mL/min (ref >60.00)
Glucose, Bld: 106 mg/dL — ABNORMAL HIGH (ref 70–99)
Potassium: 4.4 mEq/L (ref 3.5–5.1)
Sodium: 135 mEq/L (ref 135–145)
Total Bilirubin: 0.7 mg/dL (ref 0.2–1.2)
Total Protein: 6.9 g/dL (ref 6.0–8.3)

## 2021-09-13 LAB — LIPID PANEL
Cholesterol: 224 mg/dL — ABNORMAL HIGH (ref 0–200)
HDL: 55.2 mg/dL (ref >39.00)
LDL Cholesterol: 148 mg/dL — ABNORMAL HIGH (ref 0–99)
NonHDL: 168.71
Total CHOL/HDL Ratio: 4
Triglycerides: 106 mg/dL (ref 0.0–149.0)
VLDL: 21.2 mg/dL (ref 0.0–40.0)

## 2021-09-18 ENCOUNTER — Encounter: Payer: Self-pay | Admitting: Family Medicine

## 2021-09-18 ENCOUNTER — Ambulatory Visit (INDEPENDENT_AMBULATORY_CARE_PROVIDER_SITE_OTHER): Payer: 59 | Admitting: Family Medicine

## 2021-09-18 VITALS — BP 140/84 | HR 84 | Temp 97.9°F | Ht 74.0 in | Wt 232.0 lb

## 2021-09-18 DIAGNOSIS — F17200 Nicotine dependence, unspecified, uncomplicated: Secondary | ICD-10-CM

## 2021-09-18 DIAGNOSIS — R454 Irritability and anger: Secondary | ICD-10-CM

## 2021-09-18 DIAGNOSIS — E785 Hyperlipidemia, unspecified: Secondary | ICD-10-CM

## 2021-09-18 DIAGNOSIS — Z1211 Encounter for screening for malignant neoplasm of colon: Secondary | ICD-10-CM

## 2021-09-18 DIAGNOSIS — I10 Essential (primary) hypertension: Secondary | ICD-10-CM

## 2021-09-18 DIAGNOSIS — Z0000 Encounter for general adult medical examination without abnormal findings: Secondary | ICD-10-CM | POA: Diagnosis not present

## 2021-09-18 DIAGNOSIS — Z7189 Other specified counseling: Secondary | ICD-10-CM

## 2021-09-18 MED ORDER — PRAVASTATIN SODIUM 80 MG PO TABS: 80.0000 mg | ORAL_TABLET | Freq: Every day | ORAL | 3 refills | Status: DC

## 2021-09-18 MED ORDER — NICOTINE 14 MG/24HR TD PT24: 14.0000 mg | MEDICATED_PATCH | Freq: Every day | TRANSDERMAL | 0 refills | Status: DC

## 2021-09-18 MED ORDER — NICOTINE 7 MG/24HR TD PT24: 7.0000 mg | MEDICATED_PATCH | Freq: Every day | TRANSDERMAL | 0 refills | Status: DC

## 2021-09-18 NOTE — Progress Notes (Signed)
CPE- See plan.  Routine anticipatory guidance given to patient.  See health maintenance.  The possibility exists that previously documented standard health maintenance information may have been brought forward from a previous encounter into this note.  If needed, that same information has been updated to reflect the current situation based on today's encounter.    Tetanus 2022 Flu prev done PNA 23 2020.  Shingles dw pt.   covid vaccine up to date.  Prostate cancer screening and PSA options (with potential risks and benefits of testing vs not testing) were discussed along with recent recs/guidelines.  He declined testing PSA at this point. D/w pt about prev IFOB, he tried to schedule with GI. He couldn't get prev colonoscopy covered.  D/w pt about options.  He attributed prev pos IFOB to hemorrhoids. D/w pt about colonoscopy but he wanted to try cologuard in the meantime.  Based on patient preference and the assumption that his previous positive IFOB was related to hemorrhoids/benign source, it would be reasonable to get Cologuard done.  He is aware that there is a possibility he could have had a source for bleeding that was not a hemorrhoid. [facility]  D/w pt. Done 2017.  Living will d/w pt.  Wife designated if patient were incapacitated.   Diet and exercise d/w pt.    Hypertension:    Using medication without problems or lightheadedness: yes Chest pain with exertion:no Edema:no Short of breath:no  Elevated Cholesterol: Using medications without problems: yes Muscle aches: no Diet compliance: yes Exercise: yes Ho intentional weight loss.    Smoking d/w pt.  He has been travelling with work and smoking and drinking more in the meantime, after work.  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  Mood d/w pt.  Still on SSRI at baseline, with relief.  He failed taper from med prior.   Rare use of meclizine and SABA.     PMH and SH reviewed  Meds, vitals, and allergies reviewed.   ROS: Per HPI.  Unless specifically indicated otherwise in HPI, the patient denies:  General: fever. Eyes: acute vision changes ENT: sore throat Cardiovascular: chest pain Respiratory: SOB GI: vomiting GU: dysuria Musculoskeletal: acute back pain Derm: acute rash Neuro: acute motor dysfunction Psych: worsening mood Endocrine: polydipsia Heme: bleeding Allergy: hayfever  GEN: nad, alert and oriented HEENT: mucous membranes moist NECK: supple w/o LA CV: rrr. PULM: ctab, no inc wob ABD: soft, +bs EXT: no edema SKIN: no acute rash

## 2021-09-20 DIAGNOSIS — F17200 Nicotine dependence, unspecified, uncomplicated: Secondary | ICD-10-CM | POA: Insufficient documentation

## 2021-09-20 NOTE — Assessment & Plan Note (Signed)
Continue pravastatin 

## 2021-09-20 NOTE — Assessment & Plan Note (Signed)
Mood d/w pt.  Still on SSRI at baseline, with relief.  He failed taper from med prior.  Would continue fluoxetine.

## 2021-09-20 NOTE — Assessment & Plan Note (Signed)
Continue lisinopril

## 2021-09-20 NOTE — Assessment & Plan Note (Signed)
Tetanus 2022 Flu prev done PNA 23 2020.  Shingles dw pt.   covid vaccine up to date.  Prostate cancer screening and PSA options (with potential risks and benefits of testing vs not testing) were discussed along with recent recs/guidelines.  He declined testing PSA at this point. D/w pt about prev IFOB, he tried to schedule with GI. He couldn't get prev colonoscopy covered.  D/w pt about options.  He attributed prev pos IFOB to hemorrhoids. D/w pt about colonoscopy but he wanted to try cologuard in the meantime.   [facility]  D/w pt. Done 2017.  Living will d/w pt.  Wife designated if patient were incapacitated.   Diet and exercise d/w pt.

## 2021-09-20 NOTE — Assessment & Plan Note (Signed)
Smoking d/w pt.  He has been travelling with work and smoking and drinking more in the meantime, after work.  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  We talked about his work situation.  He should be working from home more and that should help.  He can use nicotine patches starting at 14 mg/day with routine cautions discussed.  We talked about tapering alcohol in the meantime.  He can update me as needed.

## 2021-09-20 NOTE — Assessment & Plan Note (Signed)
Living will d/w pt.  Wife designated if patient were incapacitated.   ?

## 2021-12-19 ENCOUNTER — Other Ambulatory Visit: Payer: Self-pay | Admitting: Family Medicine

## 2022-01-12 ENCOUNTER — Other Ambulatory Visit: Payer: Self-pay | Admitting: Family Medicine

## 2022-03-21 ENCOUNTER — Other Ambulatory Visit: Payer: Self-pay | Admitting: Family Medicine

## 2022-05-08 ENCOUNTER — Encounter: Payer: Self-pay | Admitting: Family Medicine

## 2022-05-08 ENCOUNTER — Ambulatory Visit (INDEPENDENT_AMBULATORY_CARE_PROVIDER_SITE_OTHER): Payer: 59 | Admitting: Family Medicine

## 2022-05-08 VITALS — BP 142/80 | HR 83 | Temp 98.0°F | Ht 74.0 in | Wt 250.0 lb

## 2022-05-08 DIAGNOSIS — Z733 Stress, not elsewhere classified: Secondary | ICD-10-CM | POA: Diagnosis not present

## 2022-05-08 DIAGNOSIS — Z659 Problem related to unspecified psychosocial circumstances: Secondary | ICD-10-CM | POA: Diagnosis not present

## 2022-05-08 DIAGNOSIS — F1090 Alcohol use, unspecified, uncomplicated: Secondary | ICD-10-CM

## 2022-05-08 MED ORDER — NALTREXONE HCL 50 MG PO TABS: 50.0000 mg | ORAL_TABLET | Freq: Every day | ORAL | 0 refills | Status: DC

## 2022-05-08 NOTE — Progress Notes (Signed)
D/w pt about naloxone use in general.    Pt's boss is quitting and patient is dealing with that.  Stressors d/w pt.  Travelling for work frequently.  To [place], then [place], then [place].    Drinking nightly, reported a few drinks per night.  No opiate use.  D/w pt.    Discussed mgmt of life stressors off etoh.  He has social support.  No illicits.  No legal issues.   No SI/HI.    Meds, vitals, and allergies reviewed.   ROS: Per HPI unless specifically indicated in ROS section   GEN: nad, alert and oriented HEENT: ncat NECK: supple w/o LA CV: rrr. PULM: ctab, no inc wob ABD: soft, +bs EXT: no edema SKIN: Well-perfused Speech normal.  No tremor.  Judgment appears intact.

## 2022-05-08 NOTE — Patient Instructions (Addendum)
Start naltrexone 38m a day and update me in about 1 week, sooner if needed.  Taper alcohol in the meantime.   Recheck labs in about 3-4 weeks.  Nonfasting lab visit.  Take care.  Glad to see you.

## 2022-05-09 NOTE — Assessment & Plan Note (Signed)
Start naltrexone 29m a day and update me in about 1 week, sooner if needed.  Taper alcohol in the meantime.   Recheck labs in about 3-4 weeks.  Nonfasting lab visit.  He has family support in the meantime.  Okay for outpatient follow-up.  Routine cautions given to patient.  Not using opiates per his report.

## 2022-05-28 ENCOUNTER — Other Ambulatory Visit (INDEPENDENT_AMBULATORY_CARE_PROVIDER_SITE_OTHER): Payer: 59

## 2022-05-28 DIAGNOSIS — Z659 Problem related to unspecified psychosocial circumstances: Secondary | ICD-10-CM | POA: Diagnosis not present

## 2022-05-28 LAB — COMPREHENSIVE METABOLIC PANEL
ALT: 20 U/L (ref 0–53)
AST: 16 U/L (ref 0–37)
Albumin: 4.2 g/dL (ref 3.5–5.2)
Alkaline Phosphatase: 47 U/L (ref 39–117)
BUN: 23 mg/dL (ref 6–23)
CO2: 30 mEq/L (ref 19–32)
Calcium: 9.9 mg/dL (ref 8.4–10.5)
Chloride: 101 mEq/L (ref 96–112)
Creatinine, Ser: 1.06 mg/dL (ref 0.40–1.50)
GFR: 78.22 mL/min (ref >60.00)
Glucose, Bld: 86 mg/dL (ref 70–99)
Potassium: 4.4 mEq/L (ref 3.5–5.1)
Sodium: 138 mEq/L (ref 135–145)
Total Bilirubin: 0.4 mg/dL (ref 0.2–1.2)
Total Protein: 6.8 g/dL (ref 6.0–8.3)

## 2022-06-08 ENCOUNTER — Encounter: Admission: RE | Payer: Self-pay | Source: Ambulatory Visit

## 2022-06-08 ENCOUNTER — Ambulatory Visit: Admission: RE | Admit: 2022-06-08 | Payer: 59 | Source: Ambulatory Visit | Admitting: Ophthalmology

## 2022-06-08 SURGERY — BLEPHAROPLASTY
Anesthesia: Monitor Anesthesia Care | Laterality: Bilateral

## 2022-10-08 ENCOUNTER — Encounter: Payer: Self-pay | Admitting: *Deleted

## 2022-10-08 ENCOUNTER — Encounter: Payer: Self-pay | Admitting: Family Medicine

## 2022-10-08 ENCOUNTER — Ambulatory Visit: Payer: 59 | Admitting: Family Medicine

## 2022-10-08 ENCOUNTER — Ambulatory Visit: Admission: RE | Admit: 2022-10-08 | Payer: 59 | Source: Ambulatory Visit

## 2022-10-08 VITALS — BP 134/84 | HR 85 | Temp 97.3°F | Ht 74.0 in | Wt 255.0 lb

## 2022-10-08 DIAGNOSIS — M5416 Radiculopathy, lumbar region: Secondary | ICD-10-CM

## 2022-10-08 MED ORDER — PREDNISONE 20 MG PO TABS: ORAL_TABLET | ORAL | 0 refills | Status: DC

## 2022-10-08 MED ORDER — ACETAMINOPHEN-CODEINE 300-30 MG PO TABS: 1.0000 | ORAL_TABLET | Freq: Three times a day (TID) | ORAL | 0 refills | Status: DC | PRN

## 2022-10-08 NOTE — Patient Instructions (Addendum)
I think you have herniated disc on right. Xrays today We will try to expedite MRI  Pending results may refer you to neurosurgeon. Take prednisone taper sent to your pharmacy.  May take tylenol with codeine for breakthrough pain.  Seek urgent care if any weakness of leg, worsening bowel/bladder accidents, or numbness around bottom or groin.

## 2022-10-08 NOTE — Progress Notes (Signed)
Ph: [phone] Fax: [phone]   Patient ID: Dean Bishop, male    DOB: 04-Oct-1965, 57 y.o.   MRN: [id number]  This visit was conducted in person.  BP 134/84   Pulse 85   Temp (!) 97.3 F (36.3 C) (Temporal)   Ht 6\' 2"  (1.88 m)   Wt 255 lb (115.7 kg)   SpO2 95%   BMI 32.74 kg/m    CC: R hip pain  Subjective:   HPI: Dean Bishop is a 57 y.o. male presenting on 10/08/2022 for Hip Pain (C/o R hip pain, radiating down R leg into foot- which is numb/tingling. Started 10/04/22. H/o spinal cord injury. )   Recent trip to [place] - leg ache started at that time. Lots of plane trips over the past week. Arrived to [facility] over weekend. Got home last night.   4d h/o R hip pain that travels down R leg into foot, associated with tingling and numbness of anterior R lower leg, R foot. Denies inciting trauma/injury at this time.   Bowel accident x1 while walking in airport last week. No bladder incontinence. No saddle anesthesia. No fevers/chills.  Treating with ice to spine as well as ibuprofen 800mg .   Larey Seat in [place] 3-4 yrs ago in hotel, landed on R back/buttock with resultant impression to back.   H/o brachial plexus injury to left arm (2016) (parsonage turner syndrome).  H/o SLAP tear to right shoulder by MRI 2017 - s/p surgical repair.  Latest lumbar MRI from 2017 reviewed - spondylosis worst at L4/5 with disc protrusion L>R could result in mild-mod central canal stenosis or B L5 root irritation. Had ortho eval at the time - didn't undergo surgery.      Relevant past medical, surgical, family and social history reviewed and updated as indicated. Interim medical history since our last visit reviewed. Allergies and medications reviewed and updated. Outpatient Medications Prior to Visit  Medication Sig Dispense Refill   albuterol (PROAIR HFA) 108 (90 Base) MCG/ACT inhaler INHALE 2 PUFFS EVERY 6HRS AS NEEDED FOR COUGH AND WHEEZING.  Disp ventolin inhaler 1 each 12   FLUoxetine  (PROZAC) 10 MG capsule TAKE 1 CAPSULE BY MOUTH DAILY 90 capsule 1   lisinopril (ZESTRIL) 10 MG tablet TAKE 1 TABLET BY MOUTH ONCE  DAILY 90 tablet 3   meclizine (ANTIVERT) 12.5 MG tablet TAKE 1 TO 2 TABLETS(12.5 TO 25 MG) BY MOUTH THREE TIMES DAILY AS NEEDED FOR DIZZINESS 30 tablet 3   pravastatin (PRAVACHOL) 80 MG tablet Take 1 tablet (80 mg total) by mouth daily. 90 tablet 3   naltrexone (DEPADE) 50 MG tablet Take 1 tablet (50 mg total) by mouth daily. 90 tablet 0   No facility-administered medications prior to visit.     Per HPI unless specifically indicated in ROS section below Review of Systems  Objective:  BP 134/84   Pulse 85   Temp (!) 97.3 F (36.3 C) (Temporal)   Ht 6\' 2"  (1.88 m)   Wt 255 lb (115.7 kg)   SpO2 95%   BMI 32.74 kg/m   Wt Readings from Last 3 Encounters:  10/08/22 255 lb (115.7 kg)  05/08/22 250 lb (113.4 kg)  09/18/21 232 lb (105.2 kg)      Physical Exam Vitals and nursing note reviewed.  Constitutional:      Appearance: Normal appearance. He is not ill-appearing.  Musculoskeletal:        General: Tenderness present. Normal range of motion.  Comments:  + pain midline spine to palpation at L5 No significant paraspinous mm tenderness ++ seated SLR bilaterally  No pain with int/ext rotation at hip   Skin:    General: Skin is warm and dry.     Findings: No rash.  Neurological:     Mental Status: He is alert.     Sensory: Sensory deficit (numbness to R inner thigh as well as at distal RLE into foot) present.     Motor: No weakness.     Gait: Gait abnormal.     Deep Tendon Reflexes:     Reflex Scores:      Patellar reflexes are 0 on the right side and 2+ on the left side.      Achilles reflexes are 0 on the right side and 1+ on the left side.    Comments:  ++ severe pain with seated SLR bilaterally, R>L 5/5 strength BLE Diminished DTRs RLE Antalgic gait, ambulates with crutches  Psychiatric:        Mood and Affect: Mood normal.         Behavior: Behavior normal.       Results for orders placed or performed in visit on 05/28/22  Comprehensive metabolic panel  Result Value Ref Range   Sodium 138 135 - 145 mEq/L   Potassium 4.4 3.5 - 5.1 mEq/L   Chloride 101 96 - 112 mEq/L   CO2 30 19 - 32 mEq/L   Glucose, Bld 86 70 - 99 mg/dL   BUN 23 6 - 23 mg/dL   Creatinine, Ser 4.54 0.40 - 1.50 mg/dL   Total Bilirubin 0.4 0.2 - 1.2 mg/dL   Alkaline Phosphatase 47 39 - 117 U/L   AST 16 0 - 37 U/L   ALT 20 0 - 53 U/L   Total Protein 6.8 6.0 - 8.3 g/dL   Albumin 4.2 3.5 - 5.2 g/dL   GFR 09.81 >19.14 mL/min   Calcium 9.9 8.4 - 10.5 mg/dL    Assessment & Plan:   Problem List Items Addressed This Visit     Right lumbar radiculopathy - Primary    Suspect progression of previously noted herniated disc L5 on MRI 2017.  + seated SLR, diminished sensation to RLE, diminished reflexes to RLE.  Did have isolated episode of bowel incontinence but no saddle anesthesia.  Check lumbar films today.  Will try to expedite lumbar MRI for further evaluation given motor deficits.  Rx prednisone taper. Tylenol #3 for breakthrough pain.  Red flags to seek urgent care reviewed in detail.       Relevant Orders   DG Lumbar Spine Complete   MR Lumbar Spine Wo Contrast     Meds ordered this encounter  Medications   predniSONE (DELTASONE) 20 MG tablet    Sig: Take two tablets daily for 4 days followed by one tablet daily for 4 days    Dispense:  12 tablet    Refill:  0   acetaminophen-codeine (TYLENOL #3) 300-30 MG tablet    Sig: Take 1 tablet by mouth 3 (three) times daily as needed for moderate pain (sedation precautions).    Dispense:  15 tablet    Refill:  0    Orders Placed This Encounter  Procedures   DG Lumbar Spine Complete    Standing Status:   Future    Number of Occurrences:   1    Standing Expiration Date:   10/08/2023    Order Specific Question:   Reason for  Exam (SYMPTOM  OR DIAGNOSIS REQUIRED)    Answer:   R  radiculopathy, diminished DTRs on right    Order Specific Question:   Preferred imaging location?    Answer:   [facility]   MR Lumbar Spine Wo Contrast    Standing Status:   Future    Standing Expiration Date:   10/08/2023    Order Specific Question:   What is the patient's sedation requirement?    Answer:   No Sedation    Order Specific Question:   Does the patient have a pacemaker or implanted devices?    Answer:   No    Order Specific Question:   Preferred imaging location?    Answer:   [facility] (table limit-550lbs)    Patient Instructions  I think you have herniated disc on right. Xrays today We will try to expedite MRI  Pending results may refer you to neurosurgeon. Take prednisone taper sent to your pharmacy.  May take tylenol with codeine for breakthrough pain.  Seek urgent care if any weakness of leg, worsening bowel/bladder accidents, or numbness around bottom or groin.   Follow up plan: No follow-ups on file.  Eustaquio Boyden, MD

## 2022-10-08 NOTE — Assessment & Plan Note (Signed)
Suspect progression of previously noted herniated disc L5 on MRI 2017.  + seated SLR, diminished sensation to RLE, diminished reflexes to RLE.  Did have isolated episode of bowel incontinence but no saddle anesthesia.  Check lumbar films today.  Will try to expedite lumbar MRI for further evaluation given motor deficits.  Rx prednisone taper. Tylenol #3 for breakthrough pain.  Red flags to seek urgent care reviewed in detail.

## 2022-10-13 ENCOUNTER — Other Ambulatory Visit: Payer: Self-pay | Admitting: Family Medicine

## 2022-10-15 NOTE — Telephone Encounter (Signed)
Refill request for   acetaminophen-codeine (TYLENOL #3) 300-30 MG tablet    LOV - 10/08/22 with Dr. Reece Agar Next OV - not scheduled Last refill - 7/15/4 #15/0

## 2022-10-16 MED ORDER — ACETAMINOPHEN-CODEINE 300-30 MG PO TABS: 1.0000 | ORAL_TABLET | Freq: Three times a day (TID) | ORAL | 0 refills | Status: DC | PRN

## 2022-10-16 NOTE — Telephone Encounter (Signed)
He is in [place] in the meantime.  D/w pt about options.  Ibuprofen isn't helping. He didn't see sig improvement with prednisone.  He can move the leg but he clearly has lack of movement in the foot.  He is in [place] and I checked the map- <1 mile from local hospital.  I think it makes sense to get seen ASAP at the hospital given his progressive sx/pain.  D/w pt.  He understood and can check on getting seen tonight locally.    His cell phone number is [phone].   Please check on patient on Wednesday.    Thanks.

## 2022-10-16 NOTE — Telephone Encounter (Signed)
Sent. Thanks.  °Please get update on patient.   °

## 2022-10-16 NOTE — Telephone Encounter (Signed)
Per Patient's note: I am scheduled for an MRI on the 30th and then I do not know when I will see a Neurosurgeon. I am in pain while sitting and can not stand vertical. My right leg and foot is numb and when I move my hip is in extreme pain.

## 2022-10-17 NOTE — Telephone Encounter (Signed)
Spoke with patient; he's doing about the same. He did not get seen anywhere yesterday. He would like to wait until he's been in town and has MRI done.

## 2022-10-17 NOTE — Telephone Encounter (Signed)
I will defer to patient in this case.  Thanks.

## 2022-10-23 ENCOUNTER — Ambulatory Visit
Admission: RE | Admit: 2022-10-23 | Discharge: 2022-10-23 | Disposition: A | Discharge status: Home / Self Care | Payer: 59 | Source: Ambulatory Visit | Attending: Family Medicine | Admitting: Family Medicine

## 2022-10-23 ENCOUNTER — Telehealth: Payer: Self-pay | Admitting: Family Medicine

## 2022-10-23 DIAGNOSIS — Z1211 Encounter for screening for malignant neoplasm of colon: Secondary | ICD-10-CM

## 2022-10-23 DIAGNOSIS — M5416 Radiculopathy, lumbar region: Secondary | ICD-10-CM

## 2022-10-23 NOTE — Telephone Encounter (Signed)
Pt called in requesting for  referral to be sent in for a colonoscopy? Pt states he is overdue for it. Preferred location is [facility]. Call back # [phone]

## 2022-10-24 ENCOUNTER — Other Ambulatory Visit: Payer: Self-pay | Admitting: Family Medicine

## 2022-10-24 ENCOUNTER — Encounter: Payer: Self-pay | Admitting: Family Medicine

## 2022-10-24 DIAGNOSIS — M5126 Other intervertebral disc displacement, lumbar region: Secondary | ICD-10-CM

## 2022-10-24 MED ORDER — GABAPENTIN 100 MG PO CAPS: 100.0000 mg | ORAL_CAPSULE | Freq: Three times a day (TID) | ORAL | 0 refills | Status: DC

## 2022-10-24 NOTE — Addendum Note (Signed)
Addended by: Joaquim Nam on: 10/24/2022 08:20 AM   Modules accepted: Orders

## 2022-10-24 NOTE — Telephone Encounter (Signed)
Done. Thanks.

## 2022-10-24 NOTE — Telephone Encounter (Signed)
[facility] neurosurgery referral placed this morning - can we get update on neurosurgery appt date?

## 2022-10-25 ENCOUNTER — Telehealth: Payer: Self-pay

## 2022-10-25 NOTE — Telephone Encounter (Signed)
Patient called in to schedule and colonoscopy. The days he would like in the first couple weeks of October.

## 2022-10-26 ENCOUNTER — Telehealth: Payer: Self-pay

## 2022-10-26 NOTE — Telephone Encounter (Signed)
Returned patients call to schedule his colonoscopy.  LVM for pt to return my call.   Thanks, Desera Graffeo, CMA 

## 2022-10-29 NOTE — Progress Notes (Deleted)
Referring Physician:  Joaquim Nam, MD [address] [place],  [place] [address]  Primary Physician:  Joaquim Nam, MD  History of Present Illness: 10/29/2022 Dean Bishop is here today with a chief complaint of ***  progression of previously noted herniated disc L5 on MRI 2017   Broad-based right subarticular zone disc protrusion and inferiorly migrated extrusion at L4-L5 which impinges the traversing right L5 nerve root and may impinge the traversing left L5 nerve root. 2. Disc space narrowing most advanced at L5-S1 without high-grade spinal canal or neural foraminal stenosis.    Duration: Started in 2017 Location: *** Quality: diminished sensation to RLE, diminished reflexes to RLE  Severity: ***  Precipitating: aggravated by *** Modifying factors: made better by *** Weakness: none Timing: *** Bowel/Bladder Dysfunction: none  Conservative measures:  Physical therapy: ***  Multimodal medical therapy including regular antiinflammatories: prednisone, Tylenol #3, Gabapentin, Percocet, Robaxin Injections: *** epidural steroid injections  Past Surgery: ***  Dean Bishop has ***no symptoms of cervical myelopathy.  The symptoms are causing a significant impact on the patient's life.   I have utilized the care everywhere function in epic to review the outside records available from external health systems.  Review of Systems:  A 10 point review of systems is negative, except for the pertinent positives and negatives detailed in the HPI.  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 Barnetta Chapel   Depression    Hyperlipidemia    Hypertension    Internal thrombosed hemorrhoids    Irritability     Past Surgical History: Past Surgical History:  Procedure Laterality Date   SHOULDER SURGERY Right 2017    Allergies: Allergies as of 11/01/2022 -  Review Complete 10/08/2022  Allergen Reaction Noted   Advair diskus [fluticasone-salmeterol]  05/04/2013   Lipitor [atorvastatin calcium] Other (See Comments) 10/15/2017    Medications:  Current Outpatient Medications:    acetaminophen-codeine (TYLENOL #3) 300-30 MG tablet, Take 1 tablet by mouth 3 (three) times daily as needed for moderate pain (sedation precautions)., Disp: 30 tablet, Rfl: 0   albuterol (PROAIR HFA) 108 (90 Base) MCG/ACT inhaler, INHALE 2 PUFFS EVERY 6HRS AS NEEDED FOR COUGH AND WHEEZING.  Disp ventolin inhaler, Disp: 1 each, Rfl: 12   FLUoxetine (PROZAC) 10 MG capsule, TAKE 1 CAPSULE BY MOUTH DAILY, Disp: 90 capsule, Rfl: 1   gabapentin (NEURONTIN) 100 MG capsule, Take 1-2 capsules (100-200 mg total) by mouth 3 (three) times daily., Disp: 90 capsule, Rfl: 0   lisinopril (ZESTRIL) 10 MG tablet, TAKE 1 TABLET BY MOUTH ONCE  DAILY, Disp: 90 tablet, Rfl: 3   meclizine (ANTIVERT) 12.5 MG tablet, TAKE 1 TO 2 TABLETS(12.5 TO 25 MG) BY MOUTH THREE TIMES DAILY AS NEEDED FOR DIZZINESS, Disp: 30 tablet, Rfl: 3   pravastatin (PRAVACHOL) 80 MG tablet, Take 1 tablet (80 mg total) by mouth daily., Disp: 90 tablet, Rfl: 3   predniSONE (DELTASONE) 20 MG tablet, Take two tablets daily for 4 days followed by one tablet daily for 4 days, Disp: 12 tablet, Rfl: 0  Social History: Social History   Tobacco Use   Smoking status: Former    Current packs/day: 0.00    Types: Cigarettes    Quit date: 01/25/2001    Years since quitting: 21.7   Smokeless tobacco: Former    Quit  date: 03/26/2000   Tobacco comments:    quit over 10 years ago  Substance Use Topics   Alcohol use: Yes   Drug use: No    Family Medical History: Family History  Problem Relation Age of Onset   Alcohol abuse Father    Diabetes Father    Heart disease Father    Hypertension Mother    Hypertension Sister    Hypertension Brother    Heart disease Paternal Grandfather        CABG x 2   Cancer Neg Hx    Stroke Neg  Hx    Colon cancer Neg Hx    Prostate cancer Neg Hx     Physical Examination: There were no vitals filed for this visit.  General: Patient is in no apparent distress. Attention to examination is appropriate.  Neck:   Supple.  Full range of motion.  Respiratory: Patient is breathing without any difficulty.   NEUROLOGICAL:     Awake, alert, oriented to person, place, and time.  Speech is clear and fluent.   Cranial Nerves: Pupils equal round and reactive to light.  Facial tone is symmetric.  Facial sensation is symmetric. Shoulder shrug is symmetric. Tongue protrusion is midline.    Strength: Side Biceps Triceps Deltoid Interossei Grip Wrist Ext. Wrist Flex.  R 5 5 5 5 5 5 5   L 5 5 5 5 5 5 5    Side Iliopsoas Quads Hamstring PF DF EHL  R 5 5 5 5 5 5   L 5 5 5 5 5 5    Reflexes are ***2+ and symmetric at the biceps, triceps, brachioradialis, patella and achilles.   Hoffman's is absent. Clonus is absent  Bilateral upper and lower extremity sensation is intact to light touch ***.     No evidence of dysmetria noted.  Gait is normal.    Imaging: *** I have personally reviewed the images and agree with the above interpretation.  Medical Decision Making/Assessment and Plan: Dean Bishop is a pleasant 57 y.o. male with ***  There are no diagnoses linked to this encounter.   Thank you for involving me in the care of this patient.    Lovenia Kim MD/MSCR Neurosurgery

## 2022-11-01 ENCOUNTER — Ambulatory Visit: Payer: 59 | Admitting: Neurosurgery

## 2022-11-01 ENCOUNTER — Other Ambulatory Visit: Payer: Self-pay

## 2022-11-01 ENCOUNTER — Encounter: Payer: Self-pay | Admitting: *Deleted

## 2022-11-01 ENCOUNTER — Encounter: Payer: Self-pay | Admitting: Neurosurgery

## 2022-11-01 ENCOUNTER — Ambulatory Visit (INDEPENDENT_AMBULATORY_CARE_PROVIDER_SITE_OTHER): Payer: 59 | Admitting: Neurosurgery

## 2022-11-01 VITALS — BP 138/94 | Ht 74.0 in | Wt 257.0 lb

## 2022-11-01 DIAGNOSIS — M5416 Radiculopathy, lumbar region: Secondary | ICD-10-CM | POA: Diagnosis not present

## 2022-11-01 DIAGNOSIS — R29898 Other symptoms and signs involving the musculoskeletal system: Secondary | ICD-10-CM | POA: Diagnosis not present

## 2022-11-01 DIAGNOSIS — Z01818 Encounter for other preprocedural examination: Secondary | ICD-10-CM

## 2022-11-01 DIAGNOSIS — M5126 Other intervertebral disc displacement, lumbar region: Secondary | ICD-10-CM | POA: Diagnosis not present

## 2022-11-01 NOTE — Addendum Note (Signed)
Addended by: Sharlot Gowda on: 11/01/2022 10:45 AM   Modules accepted: Orders

## 2022-11-01 NOTE — H&P (View-Only) (Signed)
 Referring Physician:  Joaquim Nam, MD [address] [place],  [place] [address]  Primary Physician:  Joaquim Nam, MD  History of Present Illness: 11/01/2022 [name] is here today with a chief complaint of predominantly right-sided lumbar radiculopathy.  This happened approximately 1 month ago when he was on a trip.  He has had severe weakness in his right lower extremity.  Also has numbness going down his leg in the L5 distribution.  Has not had bowel or bladder issues.  He does note that he cannot lift his foot and has not been able to for the past few weeks.  States that he gets worse while walking standing leaning over to the right side.  He has difficulty standing straight up.  He has been mostly bedbound or sitting in a chair.  He has tried various pain medications which did not help.  He got a steroid taper initially which gave him some partial relief but not full.  States that the pain is there constantly.  He has not noticed any improvement in his weakness.  Conservative measures:  Physical therapy:  has not participated  Multimodal medical therapy including regular antiinflammatories: prednisone, Tylenol #3, Gabapentin, Percocet, Robaxin, ibuprofen Injections:  hasn't had any epidural steroid injections  Past Surgery: none  The symptoms are causing a significant impact on the patient's life.   I have utilized the care everywhere function in epic to review the outside records available from external health systems.  Review of Systems:  A 10 point review of systems is negative, except for the pertinent positives and negatives detailed in the HPI.  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 Barnetta Chapel   Depression    Hyperlipidemia    Hypertension    Internal thrombosed hemorrhoids    Irritability     Past Surgical History: Past Surgical History:   Procedure Laterality Date   SHOULDER SURGERY Right 2017    Allergies: Allergies as of 11/01/2022 - Review Complete 10/08/2022  Allergen Reaction Noted   Advair diskus [fluticasone-salmeterol]  05/04/2013   Lipitor [atorvastatin calcium] Other (See Comments) 10/15/2017    Medications:  Current Outpatient Medications:    acetaminophen-codeine (TYLENOL #3) 300-30 MG tablet, Take 1 tablet by mouth 3 (three) times daily as needed for moderate pain (sedation precautions)., Disp: 30 tablet, Rfl: 0   albuterol (PROAIR HFA) 108 (90 Base) MCG/ACT inhaler, INHALE 2 PUFFS EVERY 6HRS AS NEEDED FOR COUGH AND WHEEZING.  Disp ventolin inhaler, Disp: 1 each, Rfl: 12   FLUoxetine (PROZAC) 10 MG capsule, TAKE 1 CAPSULE BY MOUTH DAILY, Disp: 90 capsule, Rfl: 1   gabapentin (NEURONTIN) 100 MG capsule, Take 1-2 capsules (100-200 mg total) by mouth 3 (three) times daily., Disp: 90 capsule, Rfl: 0   lisinopril (ZESTRIL) 10 MG tablet, TAKE 1 TABLET BY MOUTH ONCE  DAILY, Disp: 90 tablet, Rfl: 3   meclizine (ANTIVERT) 12.5 MG tablet, TAKE 1 TO 2 TABLETS(12.5 TO 25 MG) BY MOUTH THREE TIMES DAILY AS NEEDED FOR DIZZINESS, Disp: 30 tablet, Rfl: 3   pravastatin (PRAVACHOL) 80 MG tablet, Take 1 tablet (80 mg total) by mouth daily., Disp: 90 tablet, Rfl: 3   predniSONE (DELTASONE) 20 MG tablet, Take two tablets daily for 4 days followed by one tablet daily for 4 days, Disp: 12 tablet, Rfl: 0  Social History: Social History   Tobacco Use   Smoking status: Former    Current packs/day: 0.00    Types: Cigarettes    Quit date: 01/25/2001    Years since quitting: 21.7   Smokeless tobacco: Former    Quit date: 03/26/2000   Tobacco comments:    quit over 10 years ago  Substance Use Topics   Alcohol use: Yes   Drug use: No    Family Medical History: Family History  Problem Relation Age of Onset   Alcohol abuse Father    Diabetes Father    Heart disease Father    Hypertension Mother    Hypertension Sister     Hypertension Brother    Heart disease Paternal Grandfather        CABG x 2   Cancer Neg Hx    Stroke Neg Hx    Colon cancer Neg Hx    Prostate cancer Neg Hx     Physical Examination: There were no vitals filed for this visit.  General: Patient is in no apparent distress. Attention to examination is appropriate.  Neck:   Supple.  Full range of motion.  Respiratory: Patient is breathing without any difficulty.   NEUROLOGICAL:     Awake, alert, oriented to person, place, and time.  Speech is clear and fluent.   Cranial Nerves: Pupils equal round and reactive to light.  Facial tone is symmetric.  Facial sensation is symmetric. Shoulder shrug is symmetric. Tongue protrusion is midline.    Strength:  Side Iliopsoas Quads Hamstring PF DF EHL  R 5 5 4+ 4+ 2 1  L 5 5 5 5 5 5    Reflexes are 2+ and symmetric at the bilateral patellas, on the left lower extremity he has a intact medial hamstrings reflex as well as an intact Achilles reflex.  On the right he has a loss of his Achilles and medial hamstrings reflex.  Bilateral upper and lower extremity sensation is intact to light touch with exception of significant sensation loss in his L5 distribution on the right.     No evidence of dysmetria noted.  Gait is antalgic.  Imaging: Narrative & Impression  CLINICAL DATA:  Right lumbar radiculopathy, difficulty walking.   EXAM: MRI LUMBAR SPINE WITHOUT CONTRAST   TECHNIQUE: Multiplanar, multisequence MR imaging of the lumbar spine was performed. No intravenous contrast was administered.   COMPARISON:  Lumbar spine MRI 11/26/2015   FINDINGS: Segmentation: Standard; the lowest formed disc space is designated L5-S1.   Alignment: There is straightening of the normal lumbar lordosis. Trace retrolisthesis of L4 on L5 and L5 on S1 are unchanged.   Vertebrae: Background marrow signal is normal. Scattered prominent Schmorl's nodes with associated endplate indentation are  unchanged. There is no evidence of acute fracture. There is mild degenerative endplate signal abnormality without edema at L4-L5 and L5-S1. There is no suspicious marrow signal abnormality or marrow edema.   Conus medullaris and cauda equina: Conus extends to the L1-L2 level. Conus and cauda equina appear normal.   Paraspinal and other soft tissues: Unremarkable.   Disc levels:   T12-L1: Unremarkable.   L1-L2: Unremarkable.   L2-L3: There is a shallow disc protrusion and mild left facet arthropathy without significant spinal canal or neural foraminal stenosis, not significantly changed.   L3-L4: There is a shallow disc protrusion and annular fissure without significant spinal canal or neural foraminal stenosis, not significantly changed.   L4-L5: There is a broad-based right subarticular zone protrusion with  an inferiorly migrated extrusion extending approximately 1.0 cm below the level of the disc space and mild facet arthropathy resulting in mild spinal canal stenosis with right worse than left subarticular zone narrowing, impingement of the traversing right L5 nerve root, and possible impingement of the traversing left L5 nerve root no significant neural foraminal stenosis. The disc protrusion is worsened since 2017.   L5-S1: There is disc desiccation and narrowing with bilateral endplate spurring resulting in mild left and no significant right neural foraminal stenosis and no significant spinal canal stenosis, unchanged.   IMPRESSION: 1. Broad-based right subarticular zone disc protrusion and inferiorly migrated extrusion at L4-L5 which impinges the traversing right L5 nerve root and may impinge the traversing left L5 nerve root. 2. Disc space narrowing most advanced at L5-S1 without high-grade spinal canal or neural foraminal stenosis.     Electronically Signed   By: Lesia Hausen M.D.   On: 10/23/2022 15:05        I have personally reviewed the images and  agree with the above interpretation.  Medical Decision Making/Assessment and Plan: Right lumbar radiculopathy HNP (herniated nucleus pulposus), lumbar Right leg weakness  Mr. [place] is a pleasant 57 y.o. male with a history of a previous L4-5 radiculopathy, who has a 1 month onset of a severe right sided radiculopathy.  Had an MRI which demonstrated a large L4-5 disc herniation.  This is much worse than it was previously.  On physical exam he shows severe weakness in the L5 dermatome approximately 1-2 out of 5 in his EHL EDC and dorsiflexion.  Plantarflexion is 4 out of 5.  Decreased reflexes in the medial hamstring on the right as well as Achilles.  Positive straight leg raise.  The symptoms correlate well with his imaging with a herniated disc at L4-5.  Given his severe weakness we feel that he would benefit from a L4-5 microdiscectomy.  We will plan on scheduling early next week in order to have time for medical clearance in preparation for the patient.  We discussed risk and benefits of the procedure including CSF leak, nerve injury, continued weakness numbness tingling.  Pain.  Bowel and bladder incontinence.  Thank you for involving me in the care of this patient.    Lovenia Kim MD/MSCR Neurosurgery

## 2022-11-01 NOTE — Progress Notes (Addendum)
Referring Physician:  Joaquim Nam, MD [address] [place],  [place] [address]  Primary Physician:  Joaquim Nam, MD  History of Present Illness: 11/01/2022 [name] is here today with a chief complaint of predominantly right-sided lumbar radiculopathy.  This happened approximately 1 month ago when he was on a trip.  He has had severe weakness in his right lower extremity.  Also has numbness going down his leg in the L5 distribution.  Has not had bowel or bladder issues.  He does note that he cannot lift his foot and has not been able to for the past few weeks.  States that he gets worse while walking standing leaning over to the right side.  He has difficulty standing straight up.  He has been mostly bedbound or sitting in a chair.  He has tried various pain medications which did not help.  He got a steroid taper initially which gave him some partial relief but not full.  States that the pain is there constantly.  He has not noticed any improvement in his weakness.  Conservative measures:  Physical therapy:  has not participated  Multimodal medical therapy including regular antiinflammatories: prednisone, Tylenol #3, Gabapentin, Percocet, Robaxin, ibuprofen Injections:  hasn't had any epidural steroid injections  Past Surgery: none  The symptoms are causing a significant impact on the patient's life.   I have utilized the care everywhere function in epic to review the outside records available from external health systems.  Review of Systems:  A 10 point review of systems is negative, except for the pertinent positives and negatives detailed in the HPI.  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 Barnetta Chapel   Depression    Hyperlipidemia    Hypertension    Internal thrombosed hemorrhoids    Irritability     Past Surgical History: Past Surgical History:   Procedure Laterality Date   SHOULDER SURGERY Right 2017    Allergies: Allergies as of 11/01/2022 - Review Complete 10/08/2022  Allergen Reaction Noted   Advair diskus [fluticasone-salmeterol]  05/04/2013   Lipitor [atorvastatin calcium] Other (See Comments) 10/15/2017    Medications:  Current Outpatient Medications:    acetaminophen-codeine (TYLENOL #3) 300-30 MG tablet, Take 1 tablet by mouth 3 (three) times daily as needed for moderate pain (sedation precautions)., Disp: 30 tablet, Rfl: 0   albuterol (PROAIR HFA) 108 (90 Base) MCG/ACT inhaler, INHALE 2 PUFFS EVERY 6HRS AS NEEDED FOR COUGH AND WHEEZING.  Disp ventolin inhaler, Disp: 1 each, Rfl: 12   FLUoxetine (PROZAC) 10 MG capsule, TAKE 1 CAPSULE BY MOUTH DAILY, Disp: 90 capsule, Rfl: 1   gabapentin (NEURONTIN) 100 MG capsule, Take 1-2 capsules (100-200 mg total) by mouth 3 (three) times daily., Disp: 90 capsule, Rfl: 0   lisinopril (ZESTRIL) 10 MG tablet, TAKE 1 TABLET BY MOUTH ONCE  DAILY, Disp: 90 tablet, Rfl: 3   meclizine (ANTIVERT) 12.5 MG tablet, TAKE 1 TO 2 TABLETS(12.5 TO 25 MG) BY MOUTH THREE TIMES DAILY AS NEEDED FOR DIZZINESS, Disp: 30 tablet, Rfl: 3   pravastatin (PRAVACHOL) 80 MG tablet, Take 1 tablet (80 mg total) by mouth daily., Disp: 90 tablet, Rfl: 3   predniSONE (DELTASONE) 20 MG tablet, Take two tablets daily for 4 days followed by one tablet daily for 4 days, Disp: 12 tablet, Rfl: 0  Social History: Social History   Tobacco Use   Smoking status: Former    Current packs/day: 0.00    Types: Cigarettes    Quit date: 01/25/2001    Years since quitting: 21.7   Smokeless tobacco: Former    Quit date: 03/26/2000   Tobacco comments:    quit over 10 years ago  Substance Use Topics   Alcohol use: Yes   Drug use: No    Family Medical History: Family History  Problem Relation Age of Onset   Alcohol abuse Father    Diabetes Father    Heart disease Father    Hypertension Mother    Hypertension Sister     Hypertension Brother    Heart disease Paternal Grandfather        CABG x 2   Cancer Neg Hx    Stroke Neg Hx    Colon cancer Neg Hx    Prostate cancer Neg Hx     Physical Examination: There were no vitals filed for this visit.  General: Patient is in no apparent distress. Attention to examination is appropriate.  Neck:   Supple.  Full range of motion.  Respiratory: Patient is breathing without any difficulty.   NEUROLOGICAL:     Awake, alert, oriented to person, place, and time.  Speech is clear and fluent.   Cranial Nerves: Pupils equal round and reactive to light.  Facial tone is symmetric.  Facial sensation is symmetric. Shoulder shrug is symmetric. Tongue protrusion is midline.    Strength:  Side Iliopsoas Quads Hamstring PF DF EHL  R 5 5 4+ 4+ 2 1  L 5 5 5 5 5 5    Reflexes are 2+ and symmetric at the bilateral patellas, on the left lower extremity he has a intact medial hamstrings reflex as well as an intact Achilles reflex.  On the right he has a loss of his Achilles and medial hamstrings reflex.  Bilateral upper and lower extremity sensation is intact to light touch with exception of significant sensation loss in his L5 distribution on the right.     No evidence of dysmetria noted.  Gait is antalgic.  Imaging: Narrative & Impression  CLINICAL DATA:  Right lumbar radiculopathy, difficulty walking.   EXAM: MRI LUMBAR SPINE WITHOUT CONTRAST   TECHNIQUE: Multiplanar, multisequence MR imaging of the lumbar spine was performed. No intravenous contrast was administered.   COMPARISON:  Lumbar spine MRI 11/26/2015   FINDINGS: Segmentation: Standard; the lowest formed disc space is designated L5-S1.   Alignment: There is straightening of the normal lumbar lordosis. Trace retrolisthesis of L4 on L5 and L5 on S1 are unchanged.   Vertebrae: Background marrow signal is normal. Scattered prominent Schmorl's nodes with associated endplate indentation are  unchanged. There is no evidence of acute fracture. There is mild degenerative endplate signal abnormality without edema at L4-L5 and L5-S1. There is no suspicious marrow signal abnormality or marrow edema.   Conus medullaris and cauda equina: Conus extends to the L1-L2 level. Conus and cauda equina appear normal.   Paraspinal and other soft tissues: Unremarkable.   Disc levels:   T12-L1: Unremarkable.   L1-L2: Unremarkable.   L2-L3: There is a shallow disc protrusion and mild left facet arthropathy without significant spinal canal or neural foraminal stenosis, not significantly changed.   L3-L4: There is a shallow disc protrusion and annular fissure without significant spinal canal or neural foraminal stenosis, not significantly changed.   L4-L5: There is a broad-based right subarticular zone protrusion with  an inferiorly migrated extrusion extending approximately 1.0 cm below the level of the disc space and mild facet arthropathy resulting in mild spinal canal stenosis with right worse than left subarticular zone narrowing, impingement of the traversing right L5 nerve root, and possible impingement of the traversing left L5 nerve root no significant neural foraminal stenosis. The disc protrusion is worsened since 2017.   L5-S1: There is disc desiccation and narrowing with bilateral endplate spurring resulting in mild left and no significant right neural foraminal stenosis and no significant spinal canal stenosis, unchanged.   IMPRESSION: 1. Broad-based right subarticular zone disc protrusion and inferiorly migrated extrusion at L4-L5 which impinges the traversing right L5 nerve root and may impinge the traversing left L5 nerve root. 2. Disc space narrowing most advanced at L5-S1 without high-grade spinal canal or neural foraminal stenosis.     Electronically Signed   By: Lesia Hausen M.D.   On: 10/23/2022 15:05        I have personally reviewed the images and  agree with the above interpretation.  Medical Decision Making/Assessment and Plan: Right lumbar radiculopathy HNP (herniated nucleus pulposus), lumbar Right leg weakness  Mr. [place] is a pleasant 57 y.o. male with a history of a previous L4-5 radiculopathy, who has a 1 month onset of a severe right sided radiculopathy.  Had an MRI which demonstrated a large L4-5 disc herniation.  This is much worse than it was previously.  On physical exam he shows severe weakness in the L5 dermatome approximately 1-2 out of 5 in his EHL EDC and dorsiflexion.  Plantarflexion is 4 out of 5.  Decreased reflexes in the medial hamstring on the right as well as Achilles.  Positive straight leg raise.  The symptoms correlate well with his imaging with a herniated disc at L4-5.  Given his severe weakness we feel that he would benefit from a L4-5 microdiscectomy.  We will plan on scheduling early next week in order to have time for medical clearance in preparation for the patient.  We discussed risk and benefits of the procedure including CSF leak, nerve injury, continued weakness numbness tingling.  Pain.  Bowel and bladder incontinence.  Thank you for involving me in the care of this patient.    Lovenia Kim MD/MSCR Neurosurgery

## 2022-11-01 NOTE — Patient Instructions (Signed)
Please see below for information in regards to your upcoming surgery:   Planned surgery: Right L4-5 microdiscectomy   Surgery date: 11/06/22 at [facility] (Medical Mall: [address] [place], [place] [address]) - you will find out your arrival time the business day before your surgery.   Pre-op appointment at [facility] Pre-admit Testing: we will call you with a date/time for this. If you are scheduled for an in person appointment, Pre-admit Testing is located on the first floor of the [facility] building, [address], [address]. Please bring all prescriptions in the original prescription bottles to your appointment, even if you have reviewed medications by phone with a pharmacy representative. During this appointment, they will advise you which medications you can take the morning of surgery, and which medications you will need to hold for surgery. Pre-op labs may be done at your pre-op appointment. You are not required to fast for these labs. Should you need to change your pre-op appointment, please call Pre-admit testing at [phone].     Common restrictions after surgery: No bending, lifting, or twisting ("BLT"). Avoid lifting objects heavier than 10 pounds for the first 6 weeks after surgery. Where possible, avoid household activities that involve lifting, bending, reaching, pushing, or pulling such as laundry, vacuuming, grocery shopping, and childcare. Try to arrange for help from friends and family for these activities while you heal. Do not drive while taking prescription pain medication. Weeks 6 through 12 after surgery: avoid lifting more than 25 pounds.     How to contact us:  If you have any questions/concerns before or after surgery, you can reach [place] at [phone], or you can send a mychart message. We can be reached by phone or mychart 8am-4pm, Monday-Friday.  *Please note: Calls after 4pm are forwarded to a third party answering  service. Mychart messages are not routinely monitored during evenings, weekends, and holidays. Please call our office to contact the answering service for urgent concerns during non-business hours.     If you have FMLA/disability paperwork, please drop it off or fax it to [phone], attention Patty.   Appointments/FMLA & disability paperwork: Patty & Cristin  Nurse: Royston Cowper  Medical assistants: Laurann Montana, & Lyla Son Physician Assistants: Manning Charity & [name] Surgeons: Venetia Night, MD & Ernestine Mcmurray, MD

## 2022-11-02 ENCOUNTER — Other Ambulatory Visit: Payer: Self-pay

## 2022-11-02 ENCOUNTER — Encounter
Admission: RE | Admit: 2022-11-02 | Discharge: 2022-11-02 | Disposition: A | Discharge status: Home / Self Care | Payer: 59 | Source: Ambulatory Visit | Attending: Neurosurgery | Admitting: Neurosurgery

## 2022-11-02 VITALS — HR 88 | Resp 16 | Wt 257.5 lb

## 2022-11-02 DIAGNOSIS — I10 Essential (primary) hypertension: Secondary | ICD-10-CM | POA: Insufficient documentation

## 2022-11-02 DIAGNOSIS — Z01818 Encounter for other preprocedural examination: Secondary | ICD-10-CM | POA: Diagnosis present

## 2022-11-02 DIAGNOSIS — Z01812 Encounter for preprocedural laboratory examination: Secondary | ICD-10-CM | POA: Diagnosis not present

## 2022-11-02 DIAGNOSIS — Z01810 Encounter for preprocedural cardiovascular examination: Secondary | ICD-10-CM | POA: Diagnosis not present

## 2022-11-02 HISTORY — DX: Other intervertebral disc displacement, lumbar region: M51.26

## 2022-11-02 HISTORY — DX: Perianal venous thrombosis: K64.5

## 2022-11-02 HISTORY — DX: Neuralgic amyotrophy: G54.5

## 2022-11-02 HISTORY — DX: Other symptoms and signs involving the musculoskeletal system: R29.898

## 2022-11-02 HISTORY — DX: Dizziness and giddiness: R42

## 2022-11-02 HISTORY — DX: Hyperlipidemia, unspecified: E78.5

## 2022-11-02 HISTORY — DX: Radiculopathy, lumbar region: M54.16

## 2022-11-02 LAB — CBC
HCT: 42.4 % (ref 39.0–52.0)
Hemoglobin: 14.5 g/dL (ref 13.0–17.0)
MCH: 30.5 pg (ref 26.0–34.0)
MCHC: 34.2 g/dL (ref 30.0–36.0)
MCV: 89.1 fL (ref 80.0–100.0)
Platelets: 251 10*3/uL (ref 150–400)
RBC: 4.76 MIL/uL (ref 4.22–5.81)
RDW: 12.6 % (ref 11.5–15.5)
WBC: 6.1 10*3/uL (ref 4.0–10.5)
nRBC: 0 % (ref 0.0–0.2)

## 2022-11-02 LAB — URINALYSIS, ROUTINE W REFLEX MICROSCOPIC
Bilirubin Urine: NEGATIVE
Glucose, UA: NEGATIVE mg/dL
Hgb urine dipstick: NEGATIVE
Ketones, ur: NEGATIVE mg/dL
Leukocytes,Ua: NEGATIVE
Nitrite: NEGATIVE
Protein, ur: NEGATIVE mg/dL
Specific Gravity, Urine: 1.021 (ref 1.005–1.030)
pH: 7 (ref 5.0–8.0)

## 2022-11-02 LAB — BASIC METABOLIC PANEL
Anion gap: 9 (ref 5–15)
BUN: 24 mg/dL — ABNORMAL HIGH (ref 6–20)
CO2: 24 mmol/L (ref 22–32)
Calcium: 9.5 mg/dL (ref 8.9–10.3)
Chloride: 103 mmol/L (ref 98–111)
Creatinine, Ser: 0.89 mg/dL (ref 0.61–1.24)
GFR, Estimated: >60 mL/min (ref >60)
Glucose, Bld: 120 mg/dL — ABNORMAL HIGH (ref 70–99)
Potassium: 4.3 mmol/L (ref 3.5–5.1)
Sodium: 136 mmol/L (ref 135–145)

## 2022-11-02 LAB — TYPE AND SCREEN
ABO/RH(D): O POS
Antibody Screen: NEGATIVE

## 2022-11-02 LAB — SURGICAL PCR SCREEN
MRSA, PCR: NEGATIVE
Staphylococcus aureus: NEGATIVE

## 2022-11-02 NOTE — Patient Instructions (Addendum)
Your procedure is scheduled on: 11/06/22 - Tuesday Report to the Registration Desk on the 1st floor of the Medical Mall. To find out your arrival time, please call [phone] between 1PM - 3PM on: 11/05/22 - Monday If your arrival time is 6:00 am, do not arrive before that time as the Medical Mall entrance doors do not open until 6:00 am.  REMEMBER: Instructions that are not followed completely may result in serious medical risk, up to and including death; or upon the discretion of your surgeon and anesthesiologist your surgery may need to be rescheduled.  Do not eat food after midnight the night before surgery.  No gum chewing or hard candies.  You may however, drink CLEAR liquids up to 2 hours before you are scheduled to arrive for your surgery. Do not drink anything within 2 hours of your scheduled arrival time.  Clear liquids include: - water  - apple juice without pulp - gatorade (not RED colors) - black coffee or tea (Do NOT add milk or creamers to the coffee or tea) Do NOT drink anything that is not on this list.    You may continue Anti-inflammatories (NSAIDS) such as Advil, Aleve, Ibuprofen, Motrin, Naproxen, Naprosyn and Aspirin based products such as Excedrin, Goody's Powder, BC Powder. You may take Tylenol if needed for pain up until the day of surgery.  Stop ANY OVER THE COUNTER supplements until after surgery.  Continue taking all prescribed medications with the exception of the following:  HOLD lisinopril (ZESTRIL) on the day of your surgery.  TAKE ONLY THESE MEDICATIONS THE MORNING OF SURGERY WITH A SIP OF WATER:  FLUoxetine (PROZAC)  2 . gabapentin (NEURONTIN)  3   pravastatin (PRAVACHOL)    No Alcohol for 24 hours before or after surgery.  No Smoking including e-cigarettes for 24 hours before surgery.  No chewable tobacco products for at least 6 hours before surgery.  No nicotine patches on the day of surgery.  Do not use any "recreational" drugs for  at least a week (preferably 2 weeks) before your surgery.  Please be advised that the combination of cocaine and anesthesia may have negative outcomes, up to and including death. If you test positive for cocaine, your surgery will be cancelled.  On the morning of surgery brush your teeth with toothpaste and water, you may rinse your mouth with mouthwash if you wish. Do not swallow any toothpaste or mouthwash.  Use CHG Soap or wipes as directed on instruction sheet.  Do not wear jewelry, make-up, hairpins, clips or nail polish.  Do not wear lotions, powders, or perfumes.   Do not shave body hair from the neck down 48 hours before surgery.  Contact lenses, hearing aids and dentures may not be worn into surgery.  Do not bring valuables to the hospital. [facility] is not responsible for any missing/lost belongings or valuables.   Notify your doctor if there is any change in your medical condition (cold, fever, infection).  Wear comfortable clothing (specific to your surgery type) to the hospital.  After surgery, you can help prevent lung complications by doing breathing exercises.  Take deep breaths and cough every 1-2 hours. Your doctor may order a device called an Incentive Spirometer to help you take deep breaths. When coughing or sneezing, hold a pillow firmly against your incision with both hands. This is called "splinting." Doing this helps protect your incision. It also decreases belly discomfort.  If you are being admitted to the hospital overnight, leave  your suitcase in the car. After surgery it may be brought to your room.  In case of increased patient census, it may be necessary for you, the patient, to continue your postoperative care in the Same Day Surgery department.  If you are being discharged the day of surgery, you will not be allowed to drive home. You will need a responsible individual to drive you home and stay with you for 24 hours after surgery.   If you are  taking public transportation, you will need to have a responsible individual with you.  Please call the Pre-admissions Testing Dept. at [phone] if you have any questions about these instructions.  Surgery Visitation Policy:  Patients having surgery or a procedure may have two visitors.  Children under the age of 47 must have an adult with them who is not the patient.  Inpatient Visitation:    Visiting hours are 7 a.m. to 8 p.m. Up to four visitors are allowed at one time in a patient room. The visitors may rotate out with other people during the day.  One visitor age 70 or older may stay with the patient overnight and must be in the room by 8 p.m.    Pre-operative 5 CHG Bath Instructions   You can play a key role in reducing the risk of infection after surgery. Your skin needs to be as free of germs as possible. You can reduce the number of germs on your skin by washing with CHG (chlorhexidine gluconate) soap before surgery. CHG is an antiseptic soap that kills germs and continues to kill germs even after washing.   DO NOT use if you have an allergy to chlorhexidine/CHG or antibacterial soaps. If your skin becomes reddened or irritated, stop using the CHG and notify one of our RNs at [phone].   Please shower with the CHG soap starting 4 days before surgery using the following schedule: 08/09 - 08/13.    Please keep in mind the following:  DO NOT shave, including legs and underarms, starting the day of your first shower.   You may shave your face at any point before/day of surgery.  Place clean sheets on your bed the day you start using CHG soap. Use a clean washcloth (not used since being washed) for each shower. DO NOT sleep with pets once you start using the CHG.   CHG Shower Instructions:  If you choose to wash your hair and private area, wash first with your normal shampoo/soap.  After you use shampoo/soap, rinse your hair and body thoroughly to remove shampoo/soap  residue.  Turn the water OFF and apply about 3 tablespoons (45 ml) of CHG soap to a CLEAN washcloth.  Apply CHG soap ONLY FROM YOUR NECK DOWN TO YOUR TOES (washing for 3-5 minutes)  DO NOT use CHG soap on face, private areas, open wounds, or sores.  Pay special attention to the area where your surgery is being performed.  If you are having back surgery, having someone wash your back for you may be helpful. Wait 2 minutes after CHG soap is applied, then you may rinse off the CHG soap.  Pat dry with a clean towel  Put on clean clothes/pajamas   If you choose to wear lotion, please use ONLY the CHG-compatible lotions on the back of this paper.     Additional instructions for the day of surgery: DO NOT APPLY any lotions, deodorants, cologne, or perfumes.   Put on clean/comfortable clothes.  Brush your teeth.  Ask your nurse before applying any prescription medications to the skin.      CHG Compatible Lotions   Aveeno Moisturizing lotion  Cetaphil Moisturizing Cream  Cetaphil Moisturizing Lotion  Clairol Herbal Essence Moisturizing Lotion, Dry Skin  Clairol Herbal Essence Moisturizing Lotion, Extra Dry Skin  Clairol Herbal Essence Moisturizing Lotion, Normal Skin  Curel Age Defying Therapeutic Moisturizing Lotion with Alpha Hydroxy  Curel Extreme Care Body Lotion  Curel Soothing Hands Moisturizing Hand Lotion  Curel Therapeutic Moisturizing Cream, Fragrance-Free  Curel Therapeutic Moisturizing Lotion, Fragrance-Free  Curel Therapeutic Moisturizing Lotion, Original Formula  Eucerin Daily Replenishing Lotion  Eucerin Dry Skin Therapy Plus Alpha Hydroxy Crme  Eucerin Dry Skin Therapy Plus Alpha Hydroxy Lotion  Eucerin Original Crme  Eucerin Original Lotion  Eucerin Plus Crme Eucerin Plus Lotion  Eucerin TriLipid Replenishing Lotion  Keri Anti-Bacterial Hand Lotion  Keri Deep Conditioning Original Lotion Dry Skin Formula Softly Scented  Keri Deep Conditioning Original Lotion,  Fragrance Free Sensitive Skin Formula  Keri Lotion Fast Absorbing Fragrance Free Sensitive Skin Formula  Keri Lotion Fast Absorbing Softly Scented Dry Skin Formula  Keri Original Lotion  Keri Skin Renewal Lotion Keri Silky Smooth Lotion  Keri Silky Smooth Sensitive Skin Lotion  Nivea Body Creamy Conditioning Oil  Nivea Body Extra Enriched Teacher, adult education Moisturizing Lotion Nivea Crme  Nivea Skin Firming Lotion  NutraDerm 30 Skin Lotion  NutraDerm Skin Lotion  NutraDerm Therapeutic Skin Cream  NutraDerm Therapeutic Skin Lotion  ProShield Protective Hand Cream  Provon moisturizing lotion

## 2022-11-04 ENCOUNTER — Other Ambulatory Visit: Payer: Self-pay | Admitting: Family Medicine

## 2022-11-04 DIAGNOSIS — E785 Hyperlipidemia, unspecified: Secondary | ICD-10-CM

## 2022-11-05 ENCOUNTER — Ambulatory Visit: Payer: 59 | Admitting: Neurosurgery

## 2022-11-05 MED ORDER — CHLORHEXIDINE GLUCONATE 0.12 % MT SOLN
15.0000 mL | Freq: Once | OROMUCOSAL | Status: AC
  Administered 2022-11-06: 15 mL via OROMUCOSAL

## 2022-11-05 MED ORDER — LACTATED RINGERS IV SOLN: INTRAVENOUS | Status: DC

## 2022-11-05 MED ORDER — CEFAZOLIN IN SODIUM CHLORIDE 2-0.9 GM/100ML-% IV SOLN
2.0000 g | Freq: Once | INTRAVENOUS | Status: DC
  Filled 2022-11-05: qty 100

## 2022-11-05 MED ORDER — CEFAZOLIN SODIUM-DEXTROSE 2-4 GM/100ML-% IV SOLN
2.0000 g | INTRAVENOUS | Status: AC
  Administered 2022-11-06: 2 g via INTRAVENOUS

## 2022-11-05 MED ORDER — ORAL CARE MOUTH RINSE: 15.0000 mL | Freq: Once | OROMUCOSAL | Status: AC

## 2022-11-06 ENCOUNTER — Other Ambulatory Visit: Payer: Self-pay

## 2022-11-06 ENCOUNTER — Encounter
Admission: RE | Disposition: A | Discharge status: Home / Self Care | Payer: Self-pay | Source: Home / Self Care | Attending: Neurosurgery

## 2022-11-06 ENCOUNTER — Ambulatory Visit: Payer: 59

## 2022-11-06 ENCOUNTER — Ambulatory Visit: Payer: 59 | Admitting: Urgent Care

## 2022-11-06 ENCOUNTER — Ambulatory Visit: Payer: 59 | Admitting: Anesthesiology

## 2022-11-06 ENCOUNTER — Ambulatory Visit
Admission: RE | Admit: 2022-11-06 | Discharge: 2022-11-06 | Disposition: A | Discharge status: Home / Self Care | Payer: 59 | Attending: Neurosurgery | Admitting: Neurosurgery

## 2022-11-06 ENCOUNTER — Encounter: Payer: Self-pay | Admitting: Neurosurgery

## 2022-11-06 DIAGNOSIS — Z01818 Encounter for other preprocedural examination: Secondary | ICD-10-CM

## 2022-11-06 DIAGNOSIS — Z6833 Body mass index (BMI) 33.0-33.9, adult: Secondary | ICD-10-CM | POA: Insufficient documentation

## 2022-11-06 DIAGNOSIS — M5116 Intervertebral disc disorders with radiculopathy, lumbar region: Secondary | ICD-10-CM | POA: Diagnosis not present

## 2022-11-06 DIAGNOSIS — I10 Essential (primary) hypertension: Secondary | ICD-10-CM | POA: Diagnosis not present

## 2022-11-06 DIAGNOSIS — E669 Obesity, unspecified: Secondary | ICD-10-CM | POA: Diagnosis not present

## 2022-11-06 DIAGNOSIS — R29898 Other symptoms and signs involving the musculoskeletal system: Secondary | ICD-10-CM | POA: Diagnosis present

## 2022-11-06 DIAGNOSIS — J45909 Unspecified asthma, uncomplicated: Secondary | ICD-10-CM | POA: Diagnosis not present

## 2022-11-06 DIAGNOSIS — M5126 Other intervertebral disc displacement, lumbar region: Secondary | ICD-10-CM | POA: Diagnosis not present

## 2022-11-06 DIAGNOSIS — Z87891 Personal history of nicotine dependence: Secondary | ICD-10-CM | POA: Diagnosis not present

## 2022-11-06 DIAGNOSIS — M5416 Radiculopathy, lumbar region: Secondary | ICD-10-CM | POA: Diagnosis not present

## 2022-11-06 HISTORY — PX: LUMBAR LAMINECTOMY/DECOMPRESSION MICRODISCECTOMY: SHX5026

## 2022-11-06 SURGERY — LUMBAR LAMINECTOMY/DECOMPRESSION MICRODISCECTOMY 1 LEVEL
Anesthesia: General | Site: Spine Lumbar | Laterality: Right

## 2022-11-06 MED ORDER — SUCCINYLCHOLINE CHLORIDE 200 MG/10ML IV SOSY
PREFILLED_SYRINGE | INTRAVENOUS | Status: AC
  Filled 2022-11-06: qty 10

## 2022-11-06 MED ORDER — ONDANSETRON HCL 4 MG/2ML IJ SOLN: 4.0000 mg | Freq: Once | INTRAMUSCULAR | Status: DC | PRN

## 2022-11-06 MED ORDER — KETAMINE HCL 50 MG/5ML IJ SOSY
PREFILLED_SYRINGE | INTRAMUSCULAR | Status: AC
  Filled 2022-11-06: qty 5

## 2022-11-06 MED ORDER — KETAMINE HCL 10 MG/ML IJ SOLN
INTRAMUSCULAR | Status: DC | PRN
  Administered 2022-11-06: 50 mg via INTRAVENOUS

## 2022-11-06 MED ORDER — BUPIVACAINE LIPOSOME 1.3 % IJ SUSP
INTRAMUSCULAR | Status: AC
  Filled 2022-11-06: qty 20

## 2022-11-06 MED ORDER — MIDAZOLAM HCL 2 MG/2ML IJ SOLN
INTRAMUSCULAR | Status: DC | PRN
  Administered 2022-11-06: 2 mg via INTRAVENOUS

## 2022-11-06 MED ORDER — ACETAMINOPHEN 10 MG/ML IV SOLN: 1000.0000 mg | Freq: Once | INTRAVENOUS | Status: DC | PRN

## 2022-11-06 MED ORDER — CEFAZOLIN SODIUM-DEXTROSE 2-4 GM/100ML-% IV SOLN
INTRAVENOUS | Status: AC
  Filled 2022-11-06: qty 100

## 2022-11-06 MED ORDER — OXYCODONE HCL 5 MG PO TABS
5.0000 mg | ORAL_TABLET | Freq: Once | ORAL | Status: AC | PRN
  Administered 2022-11-06: 5 mg via ORAL

## 2022-11-06 MED ORDER — SODIUM CHLORIDE (PF) 0.9 % IJ SOLN
INTRAMUSCULAR | Status: DC | PRN
  Administered 2022-11-06: 30 mL via INTRAMUSCULAR

## 2022-11-06 MED ORDER — BUPIVACAINE-EPINEPHRINE (PF) 0.5% -1:200000 IJ SOLN
INTRAMUSCULAR | Status: AC
  Filled 2022-11-06: qty 10

## 2022-11-06 MED ORDER — DEXAMETHASONE SODIUM PHOSPHATE 10 MG/ML IJ SOLN
INTRAMUSCULAR | Status: AC
  Filled 2022-11-06: qty 1

## 2022-11-06 MED ORDER — BUPIVACAINE HCL (PF) 0.5 % IJ SOLN
INTRAMUSCULAR | Status: AC
  Filled 2022-11-06: qty 30

## 2022-11-06 MED ORDER — OXYCODONE HCL 5 MG/5ML PO SOLN: 5.0000 mg | Freq: Once | ORAL | Status: AC | PRN

## 2022-11-06 MED ORDER — LACTATED RINGERS IV SOLN: INTRAVENOUS | Status: DC

## 2022-11-06 MED ORDER — FENTANYL CITRATE (PF) 100 MCG/2ML IJ SOLN
INTRAMUSCULAR | Status: DC | PRN
  Administered 2022-11-06 (×2): 50 ug via INTRAVENOUS

## 2022-11-06 MED ORDER — OXYCODONE HCL 5 MG PO TABS
5.0000 mg | ORAL_TABLET | ORAL | 0 refills | Status: DC | PRN
Start: 2022-11-06 — End: 2022-11-16

## 2022-11-06 MED ORDER — DEXAMETHASONE SODIUM PHOSPHATE 10 MG/ML IJ SOLN
INTRAMUSCULAR | Status: DC | PRN
  Administered 2022-11-06: 10 mg via INTRAVENOUS

## 2022-11-06 MED ORDER — PROPOFOL 10 MG/ML IV BOLUS
INTRAVENOUS | Status: AC
  Filled 2022-11-06: qty 40

## 2022-11-06 MED ORDER — SODIUM CHLORIDE FLUSH 0.9 % IV SOLN
INTRAVENOUS | Status: AC
  Filled 2022-11-06: qty 20

## 2022-11-06 MED ORDER — BUPIVACAINE-EPINEPHRINE (PF) 0.5% -1:200000 IJ SOLN
INTRAMUSCULAR | Status: DC | PRN
  Administered 2022-11-06: 7 mL

## 2022-11-06 MED ORDER — FENTANYL CITRATE (PF) 100 MCG/2ML IJ SOLN: 25.0000 ug | INTRAMUSCULAR | Status: DC | PRN

## 2022-11-06 MED ORDER — ONDANSETRON HCL 4 MG/2ML IJ SOLN
INTRAMUSCULAR | Status: DC | PRN
  Administered 2022-11-06: 4 mg via INTRAVENOUS

## 2022-11-06 MED ORDER — MIDAZOLAM HCL 2 MG/2ML IJ SOLN
INTRAMUSCULAR | Status: AC
  Filled 2022-11-06: qty 2

## 2022-11-06 MED ORDER — ONDANSETRON HCL 4 MG/2ML IJ SOLN
INTRAMUSCULAR | Status: AC
  Filled 2022-11-06: qty 2

## 2022-11-06 MED ORDER — OXYCODONE HCL 5 MG PO TABS
ORAL_TABLET | ORAL | Status: AC
  Filled 2022-11-06: qty 1

## 2022-11-06 MED ORDER — SURGIFLO WITH THROMBIN (HEMOSTATIC MATRIX KIT) OPTIME
TOPICAL | Status: DC | PRN
  Administered 2022-11-06: 1 via TOPICAL

## 2022-11-06 MED ORDER — LIDOCAINE HCL (CARDIAC) PF 100 MG/5ML IV SOSY
PREFILLED_SYRINGE | INTRAVENOUS | Status: DC | PRN
  Administered 2022-11-06: 100 mg via INTRAVENOUS

## 2022-11-06 MED ORDER — METHYLPREDNISOLONE ACETATE 40 MG/ML IJ SUSP
INTRAMUSCULAR | Status: DC | PRN
  Administered 2022-11-06: 40 mg

## 2022-11-06 MED ORDER — SENNA 8.6 MG PO TABS: 1.0000 | ORAL_TABLET | Freq: Every day | ORAL | 0 refills | Status: DC | PRN

## 2022-11-06 MED ORDER — LIDOCAINE HCL (PF) 2 % IJ SOLN
INTRAMUSCULAR | Status: AC
  Filled 2022-11-06: qty 5

## 2022-11-06 MED ORDER — FENTANYL CITRATE (PF) 100 MCG/2ML IJ SOLN
INTRAMUSCULAR | Status: AC
  Filled 2022-11-06: qty 2

## 2022-11-06 MED ORDER — SUCCINYLCHOLINE CHLORIDE 200 MG/10ML IV SOSY
PREFILLED_SYRINGE | INTRAVENOUS | Status: DC | PRN
  Administered 2022-11-06: 120 mg via INTRAVENOUS

## 2022-11-06 MED ORDER — SUGAMMADEX SODIUM 200 MG/2ML IV SOLN
INTRAVENOUS | Status: DC | PRN
  Administered 2022-11-06: 200 mg via INTRAVENOUS

## 2022-11-06 MED ORDER — METHOCARBAMOL 500 MG PO TABS: 500.0000 mg | ORAL_TABLET | Freq: Four times a day (QID) | ORAL | 0 refills | Status: DC

## 2022-11-06 MED ORDER — PHENYLEPHRINE HCL (PRESSORS) 10 MG/ML IV SOLN
INTRAVENOUS | Status: DC | PRN
  Administered 2022-11-06: 120 ug via INTRAVENOUS
  Administered 2022-11-06: 160 ug via INTRAVENOUS
  Administered 2022-11-06: 80 ug via INTRAVENOUS
  Administered 2022-11-06: 120 ug via INTRAVENOUS

## 2022-11-06 MED ORDER — CHLORHEXIDINE GLUCONATE 0.12 % MT SOLN
OROMUCOSAL | Status: AC
  Filled 2022-11-06: qty 15

## 2022-11-06 MED ORDER — PROPOFOL 10 MG/ML IV BOLUS
INTRAVENOUS | Status: DC | PRN
Start: 2022-11-06 — End: 2022-11-06
  Administered 2022-11-06: 200 mg via INTRAVENOUS

## 2022-11-06 MED ORDER — METHYLPREDNISOLONE ACETATE 40 MG/ML IJ SUSP
INTRAMUSCULAR | Status: AC
  Filled 2022-11-06: qty 1

## 2022-11-06 MED ORDER — 0.9 % SODIUM CHLORIDE (POUR BTL) OPTIME
TOPICAL | Status: DC | PRN
  Administered 2022-11-06: 500 mL

## 2022-11-06 MED ORDER — ROCURONIUM BROMIDE 100 MG/10ML IV SOLN
INTRAVENOUS | Status: DC | PRN
  Administered 2022-11-06 (×2): 10 mg via INTRAVENOUS
  Administered 2022-11-06: 40 mg via INTRAVENOUS

## 2022-11-06 SURGICAL SUPPLY — 42 items
ADH SKN CLS APL DERMABOND .7 (GAUZE/BANDAGES/DRESSINGS) ×1
AGENT HMST KT MTR STRL THRMB (HEMOSTASIS) ×1
BASIN KIT SINGLE STR (MISCELLANEOUS) ×2 IMPLANT
BRUSH SCRUB EZ 4% CHG (MISCELLANEOUS) ×2 IMPLANT
BUR NEURO DRILL SOFT 3.0X3.8M (BURR) ×2 IMPLANT
DERMABOND ADVANCED .7 DNX12 (GAUZE/BANDAGES/DRESSINGS) ×2 IMPLANT
DRAPE C ARM PK CFD 31 SPINE (DRAPES) ×2 IMPLANT
DRAPE C-ARM XRAY 36X54 (DRAPES) IMPLANT
DRAPE LAPAROTOMY 100X77 ABD (DRAPES) ×2 IMPLANT
DRAPE MICROSCOPE SPINE 48X150 (DRAPES) ×2 IMPLANT
DRSG OPSITE POSTOP 3X4 (GAUZE/BANDAGES/DRESSINGS) ×2 IMPLANT
ELECT EZSTD 165MM 6.5IN (MISCELLANEOUS) ×1
ELECTRODE EZSTD 165MM 6.5IN (MISCELLANEOUS) ×2 IMPLANT
GLOVE SRG 8 PF TXTR STRL LF DI (GLOVE) ×2 IMPLANT
GLOVE SURG SYN 7.5 E (GLOVE) ×1 IMPLANT
GLOVE SURG SYN 7.5 PF PI (GLOVE) ×2 IMPLANT
GLOVE SURG UNDER POLY LF SZ8 (GLOVE) ×1
GOWN STRL REUS W/ TWL XL LVL3 (GOWN DISPOSABLE) ×2 IMPLANT
GOWN STRL REUS W/TWL XL LVL3 (GOWN DISPOSABLE) ×1
GRAFT DURAGEN MATRIX 1WX1L (Tissue) IMPLANT
KIT SPINAL PRONEVIEW (KITS) ×2 IMPLANT
KIT WILSON FRAME (KITS) IMPLANT
MANIFOLD NEPTUNE II (INSTRUMENTS) ×2 IMPLANT
MARKER SKIN DUAL TIP RULER LAB (MISCELLANEOUS) ×2 IMPLANT
NDL SAFETY ECLIP 18X1.5 (MISCELLANEOUS) IMPLANT
NS IRRIG 1000ML POUR BTL (IV SOLUTION) ×2 IMPLANT
NS IRRIG 500ML POUR BTL (IV SOLUTION) IMPLANT
PACK LAMINECTOMY ARMC (PACKS) ×2 IMPLANT
PAD ARMBOARD 7.5X6 YLW CONV (MISCELLANEOUS) ×2 IMPLANT
SURGIFLO W/THROMBIN 8M KIT (HEMOSTASIS) ×2 IMPLANT
SUT MNCRL 4-0 (SUTURE) ×1
SUT MNCRL 4-0 27XMFL (SUTURE) ×1
SUT VIC AB 0 CT1 27 (SUTURE) ×1
SUT VIC AB 0 CT1 27XCR 8 STRN (SUTURE) ×2 IMPLANT
SUT VIC AB 2-0 CT1 18 (SUTURE) ×2 IMPLANT
SUTURE MNCRL 4-0 27XMF (SUTURE) ×2 IMPLANT
SYR 10ML LL (SYRINGE) ×2 IMPLANT
SYR 30ML LL (SYRINGE) ×4 IMPLANT
SYR 3ML LL SCALE MARK (SYRINGE) ×2 IMPLANT
TRAP FLUID SMOKE EVACUATOR (MISCELLANEOUS) ×2 IMPLANT
WATER STERILE IRR 1000ML POUR (IV SOLUTION) ×4 IMPLANT
WATER STERILE IRR 500ML POUR (IV SOLUTION) IMPLANT

## 2022-11-06 NOTE — Discharge Summary (Signed)
Discharge Summary  Patient ID: Dean Bishop MRN: [id number] DOB/AGE: 1965/09/14 57 y.o.  Admit date: 11/06/2022 Discharge date: 11/06/2022  Admission Diagnoses: Right lumbar radiculopathy M54.16 Herniated nucleus pulposus, lumbar  M51.26                         Right leg weaknessR29.898  Discharge Diagnoses:  Principal Problem:   HNP (herniated nucleus pulposus), lumbar Active Problems:   Right lumbar radiculopathy   Right leg weakness   Discharged Condition: good  Hospital Course:  Dean Bishop is a 57 y.o presenting with symptoms concerning for lumbar radiculopathy and right foot drop.  He underwent a right L4-5 microdiscectomy.  His intraoperative course was uncomplicated.  He was monitored in PACU and noted to have improvement of his radicular symptoms and foot strength postoperatively.  He was able to ambulate, urinate, and tolerate p.o. intake.  He was discharged home with medications for pain and muscle relaxation.  Consults: None  Significant Diagnostic Studies: none   Treatments: surgery: As above.  Please see separately dictated operative report for further details.  Discharge Exam: Blood pressure 117/84, pulse 68, temperature 98.1 F (36.7 C), resp. rate 18, height 6\' 2"  (1.88 m), weight 116.8 kg, SpO2 95%.  Side Iliopsoas Quads Hamstring PF DF EHL  R 5 5 5 5  4+ 3  L 5 5 5 5 5 5      Disposition: Discharge disposition: 01-Home or Self Care        Allergies as of 11/06/2022       Reactions   Advair Diskus [fluticasone-salmeterol]    Hoarse voice   Lipitor [atorvastatin Calcium] Other (See Comments)   aches        Medication List     STOP taking these medications    albuterol 108 (90 Base) MCG/ACT inhaler Commonly known as: ProAir HFA       TAKE these medications    calcium carbonate 750 MG chewable tablet Commonly known as: TUMS EX Chew 1 tablet by mouth as needed for heartburn.   FLUoxetine 10 MG capsule Commonly known as:  PROZAC TAKE 1 CAPSULE BY MOUTH DAILY   gabapentin 100 MG capsule Commonly known as: NEURONTIN Take 1-2 capsules (100-200 mg total) by mouth 3 (three) times daily.   ibuprofen 800 MG tablet Commonly known as: ADVIL Take 800 mg by mouth every 8 (eight) hours as needed.   lisinopril 10 MG tablet Commonly known as: ZESTRIL TAKE 1 TABLET BY MOUTH ONCE  DAILY   meclizine 12.5 MG tablet Commonly known as: ANTIVERT TAKE 1 TO 2 TABLETS(12.5 TO 25 MG) BY MOUTH THREE TIMES DAILY AS NEEDED FOR DIZZINESS   methocarbamol 500 MG tablet Commonly known as: ROBAXIN Take 1 tablet (500 mg total) by mouth 4 (four) times daily.   oxyCODONE 5 MG immediate release tablet Commonly known as: Roxicodone Take 1 tablet (5 mg total) by mouth every 4 (four) hours as needed for severe pain.   pravastatin 80 MG tablet Commonly known as: PRAVACHOL TAKE 1 TABLET BY MOUTH DAILY   predniSONE 20 MG tablet Commonly known as: DELTASONE Take 20 mg by mouth daily with breakfast.   senna 8.6 MG Tabs tablet Commonly known as: SENOKOT Take 1 tablet (8.6 mg total) by mouth daily as needed for mild constipation.         Signed: Susanne Borders 11/06/2022, 10:09 AM

## 2022-11-06 NOTE — Anesthesia Procedure Notes (Signed)
Procedure Name: Intubation Date/Time: 11/06/2022 7:26 AM  Performed by: Berniece Pap, CRNAPre-anesthesia Checklist: Patient identified, Emergency Drugs available, Suction available and Patient being monitored Patient Re-evaluated:Patient Re-evaluated prior to induction Oxygen Delivery Method: Circle system utilized Preoxygenation: Pre-oxygenation with 100% oxygen Induction Type: IV induction Ventilation: Mask ventilation without difficulty Laryngoscope Size: McGraph and 4 Grade View: Grade I Tube type: Oral Tube size: 7.5 mm Number of attempts: 1 Airway Equipment and Method: Stylet and Oral airway Placement Confirmation: ETT inserted through vocal cords under direct vision, positive ETCO2 and breath sounds checked- equal and bilateral Secured at: 21 cm Tube secured with: Tape Dental Injury: Teeth and Oropharynx as per pre-operative assessment

## 2022-11-06 NOTE — Interval H&P Note (Signed)
History and Physical Interval Note:  11/06/2022 7:10 AM  [name]  has presented today for surgery, with the diagnosis of Right lumbar radiculopathy M54.16 Herniated nucleus pulposus, lumbar  M51.26   Right leg weakness R29.898.  The various methods of treatment have been discussed with the patient and family. After consideration of risks, benefits and other options for treatment, the patient has consented to  Procedure(s): RIGHT L4-5 MICRODISCECTOMY (Right) as a surgical intervention.  The patient's history has been reviewed, patient examined, no change in status, stable for surgery.  I have reviewed the patient's chart and labs.  Questions were answered to the patient's satisfaction.     Dean Bishop

## 2022-11-06 NOTE — Anesthesia Preprocedure Evaluation (Addendum)
Anesthesia Evaluation  Patient identified by MRN, date of birth, ID band Patient awake    Reviewed: Allergy & Precautions, NPO status , Patient's Chart, lab work & pertinent test results  History of Anesthesia Complications Negative for: history of anesthetic complications  Airway Mallampati: IV   Neck ROM: Full    Dental   Bridge :   Pulmonary asthma , former smoker (quit 2002)   Pulmonary exam normal breath sounds clear to auscultation       Cardiovascular hypertension, Normal cardiovascular exam Rhythm:Regular Rate:Normal  ECG 11/02/22: normal   Neuro/Psych  PSYCHIATRIC DISORDERS Anxiety Depression     Neuromuscular disease (brachial neuritis ([place] Turner syndrome) with hx right arm numbness)    GI/Hepatic negative GI ROS,,,  Endo/Other  Obesity   Renal/GU negative Renal ROS     Musculoskeletal   Abdominal   Peds  Hematology negative hematology ROS (+)   Anesthesia Other Findings   Reproductive/Obstetrics                             Anesthesia Physical Anesthesia Plan  ASA: 2  Anesthesia Plan: General   Post-op Pain Management:    Induction: Intravenous  PONV Risk Score and Plan: 2 and Ondansetron, Dexamethasone and Treatment may vary due to age or medical condition  Airway Management Planned: Oral ETT  Additional Equipment:   Intra-op Plan:   Post-operative Plan: Extubation in OR  Informed Consent: I have reviewed the patients History and Physical, chart, labs and discussed the procedure including the risks, benefits and alternatives for the proposed anesthesia with the patient or authorized representative who has indicated his/her understanding and acceptance.     Dental advisory given  Plan Discussed with: CRNA  Anesthesia Plan Comments: (Patient consented for risks of anesthesia including but not limited to:  - adverse reactions to medications - damage to  eyes, teeth, lips or other oral mucosa - nerve damage due to positioning  - sore throat or hoarseness - damage to heart, brain, nerves, lungs, other parts of body or loss of life  Informed patient about role of CRNA in peri- and intra-operative care.  Patient voiced understanding.)        Anesthesia Quick Evaluation

## 2022-11-06 NOTE — Op Note (Signed)
Indications: Mr. Rushi Zingg is suffering from lumbar radiculopathy. The patient had significant progressive neurologic deficits including a right-sided foot drop secondary to a severe radiculopathy.  Findings: Herniated disc fragment causing severe compression of the traversing L5 nerve root well decompressed at the end of the procedure  Preoperative Diagnosis:  Right lumbar radiculopathy M54.16 Herniated nucleus pulposus, lumbar  M51.26                         Right leg weaknessR29.898  Postoperative Diagnosis: same   EBL: 50 ml IVF: see anesthesia record Drains: none Disposition: Extubated and Stable to PACU Complications: none  No foley catheter was placed.   Preoperative Note:   Risks of surgery discussed include: infection, bleeding, stroke, coma, death, paralysis, CSF leak, nerve/spinal cord injury, numbness, tingling, weakness, complex regional pain syndrome, recurrent stenosis and/or disc herniation, vascular injury, development of instability, neck/back pain, need for further surgery, persistent symptoms, development of deformity, and the risks of anesthesia. The patient understood these risks and agreed to proceed.  Operative Note:   1) right sided L 4/5 microdiscectomy  The patient was then brought from the preoperative center with intravenous access established.  The patient underwent general anesthesia and endotracheal tube intubation, and was then rotated on the [place] rail top where all pressure points were appropriately padded.  The skin was then thoroughly cleansed.  Perioperative antibiotic prophylaxis was administered.  Sterile prep and drapes were then applied and a timeout was then observed.  C-arm was brought into the field under sterile conditions, and the L 4-5 disc space identified and marked with an incision on the right 1cm lateral to midline.  Once this was complete a 2 cm incision was opened with the use of a #10 blade knife.  The Metrx tubes were  sequentially advanced under lateral fluoroscopy until a 18 x 60 mm Metrx tube was placed over the facet and lamina and secured to the bed.    The microscope was then sterilely brought into the field and muscle creep was hemostased with a bipolar and resected with a pituitary rongeur.  A Bovie extender was then used to expose the spinous process and lamina.  Careful attention was placed to not violate the facet capsule. A 3 mm matchstick drill bit was then used to make a hemi-laminotomy trough until the ligamentum flavum was exposed.  This was extended to the base of the spinous process.  Once this was complete and the underlying ligamentum flavum was visualized, the ligamentum was dissected with an up angle curette and resected with a #2 and #3 mm biting Kerrison.  The laminotomy opening was also expanded in similar fashion and hemostasis was obtained with Surgifoam and a patty as well as bone wax.  The rostral aspect of the caudal level of the lamina was also resected with a #2 biting Kerrison effort to further enhance exposure.  Once the underlying dura was visualized a Penfield 4 was then used to dissect and expose the traversing nerve root.  Once this was identified a nerve root retractor suction was used to mobilize this medially.  The venous plexus was hemostased with Surgifoam and light bipolar use.  There was an extruded fragment that was inferior and lateral, this was removed in piecemeal.  We were able to identify a annular disruption which we entered extruding some more disc material.    The disc herniation was identified and dissected free using a balltip probe. The pituitary rongeur  was used to remove the extruded disc fragments. Once the thecal sac and nerve root were noted to be relaxed and under less tension the ball-tipped feeler was passed along the foramen distally to ensure no residual compression was noted.    Depo-Medrol was placed along the nerve root.  The area was irrigated. The tube  system was then removed under microscopic visualization and hemostasis was obtained with a bipolar.    The fascial layer was reapproximated with the use of a 0- Vicryl suture.  Subcutaneous tissue layer was reapproximated using 2-0 Vicryl suture.  3-0 monocryl was used on the skin. The skin was then cleansed and Dermabond was used to close the skin opening.  Patient was then rotated back to the preoperative bed awakened from anesthesia and taken to recovery all counts are correct in this case.  I performed this procedure in entirety without the use of an assistant.  Lovenia Kim, MD Department of Neurosurgery

## 2022-11-06 NOTE — Discharge Instructions (Addendum)
Your surgeon has performed an operation on your lumbar spine (low back) to relieve pressure on one or more nerves. Many times, patients feel better immediately after surgery and can "overdo it." Even if you feel well, it is important that you follow these activity guidelines. If you do not let your back heal properly from the surgery, you can increase the chance of a disc herniation and/or return of your symptoms. The following are instructions to help in your recovery once you have been discharged from the hospital.  * It is ok to take NSAIDs after surgery.  Activity    No bending, lifting, or twisting ("BLT"). Avoid lifting objects heavier than 10 pounds (gallon milk jug).  Where possible, avoid household activities that involve lifting, bending, pushing, or pulling such as laundry, vacuuming, grocery shopping, and childcare. Try to arrange for help from friends and family for these activities while your back heals.  Increase physical activity slowly as tolerated.  Taking short walks is encouraged, but avoid strenuous exercise. Do not jog, run, bicycle, lift weights, or participate in any other exercises unless specifically allowed by your doctor. Avoid prolonged sitting, including car rides.  Talk to your doctor before resuming sexual activity.  You should not drive until cleared by your doctor.  Until released by your doctor, you should not return to work or school.  You should rest at home and let your body heal.   You may shower three days after your surgery.  After showering, lightly dab your incision dry. Do not take a tub bath or go swimming for 3 weeks, or until approved by your doctor at your follow-up appointment.  If you smoke, we strongly recommend that you quit.  Smoking has been proven to interfere with normal healing in your back and will dramatically reduce the success rate of your surgery. Please contact [facility] (800-QUIT-NOW) and use the resources at www.[facility].com for  assistance in stopping smoking.  Surgical Incision   If you have a dressing on your incision, you may remove it three days after your surgery. Keep your incision area clean and dry.  If you have staples or stitches on your incision, you should have a follow up scheduled for removal. If you do not have staples or stitches, you will have steri-strips (small pieces of surgical tape) or Dermabond glue. The steri-strips/glue should begin to peel away within about a week (it is fine if the steri-strips fall off before then). If the strips are still in place one week after your surgery, you may gently remove them.  Diet            You may return to your usual diet. Be sure to stay hydrated.  When to Contact us  Although your surgery and recovery will likely be uneventful, you may have some residual numbness, aches, and pains in your back and/or legs. This is normal and should improve in the next few weeks.  However, should you experience any of the following, contact us immediately: New numbness or weakness Pain that is progressively getting worse, and is not relieved by your pain medications or rest Bleeding, redness, swelling, pain, or drainage from surgical incision Chills or flu-like symptoms Fever greater than 101.0 F (38.3 C) Problems with bowel or bladder functions Difficulty breathing or shortness of breath Warmth, tenderness, or swelling in your calf  Contact Information How to contact us:  If you have any questions/concerns before or after surgery, you can reach [place] at [phone], or you can  send a [employer]. We can be reached by phone or mychart 8am-4pm, Monday-Friday.  *Please note: Calls after 4pm are forwarded to a third party answering service. Mychart messages are not routinely monitored during evenings, weekends, and holidays. Please call our office to contact the answering service for urgent concerns during non-business hours.   AMBULATORY SURGERY  DISCHARGE  INSTRUCTIONS   The drugs that you were given will stay in your system until tomorrow so for the next 24 hours you should not:  Drive an automobile Make any legal decisions Drink any alcoholic beverage   You may resume regular meals tomorrow.  Today it is better to start with liquids and gradually work up to solid foods.  You may eat anything you prefer, but it is better to start with liquids, then soup and crackers, and gradually work up to solid foods.   Please notify your doctor immediately if you have any unusual bleeding, trouble breathing, redness and pain at the surgery site, drainage, fever, or pain not relieved by medication.    Additional Instructions: PLEASE LEAVE EXPAREL (TEAL) ARMBAND ON FOR 4 DAYS     Please contact your physician with any problems or Same Day Surgery at [phone], Monday through Friday 6 am to 4 pm, or [place] at [facility] number at [phone].

## 2022-11-06 NOTE — Anesthesia Postprocedure Evaluation (Signed)
Anesthesia Post Note  Patient: Dean Bishop  Procedure(s) Performed: RIGHT L4-5 MICRODISCECTOMY (Right: Spine Lumbar)  Patient location during evaluation: PACU Anesthesia Type: General Level of consciousness: awake and alert, oriented and patient cooperative Pain management: pain level controlled Vital Signs Assessment: post-procedure vital signs reviewed and stable Respiratory status: spontaneous breathing, nonlabored ventilation and respiratory function stable Cardiovascular status: blood pressure returned to baseline and stable Postop Assessment: adequate PO intake Anesthetic complications: no   No notable events documented.   Last Vitals:  Vitals:   11/06/22 0930 11/06/22 0951  BP: 122/82 117/84  Pulse: 82 68  Resp: 12 18  Temp:    SpO2: 91% 95%    Last Pain:  Vitals:   11/06/22 0951  TempSrc:   PainSc: 2                  Reed Breech

## 2022-11-06 NOTE — Transfer of Care (Signed)
Immediate Anesthesia Transfer of Care Note  Patient: Dean Bishop  Procedure(s) Performed: RIGHT L4-5 MICRODISCECTOMY (Right: Spine Lumbar)  Patient Location: PACU  Anesthesia Type:General  Level of Consciousness: awake and alert   Airway & Oxygen Therapy: Patient Spontanous Breathing and Patient connected to face mask oxygen  Post-op Assessment: Report given to RN and Post -op Vital signs reviewed and stable  Post vital signs: Reviewed and stable  Last Vitals:  Vitals Value Taken Time  BP 137/97 11/06/22 0908  Temp    Pulse 92 11/06/22 0910  Resp 21 11/06/22 0910  SpO2 99 % 11/06/22 0910  Vitals shown include unfiled device data.  Last Pain:  Vitals:   11/06/22 0614  TempSrc: Temporal  PainSc: 4          Complications: No notable events documented.

## 2022-11-10 ENCOUNTER — Encounter: Payer: Self-pay | Admitting: Family Medicine

## 2022-11-15 NOTE — Progress Notes (Signed)
   REFERRING PHYSICIAN:  Joaquim Nam, Md [address] [place],  [place] [address]  DOS: 11/06/22  right sided L4-L5 microdiscectomy   HISTORY OF PRESENT ILLNESS: Dean Bishop is approximately 2 weeks status post right sided L4-L5 microdiscectomy . Was given robaxin and oxycodone on discharge from the hospital.   He has minimal back pain. His preop electrical pain in his right leg is gone. He still has right leg pain. He notes burning in foot and ankle. He has numbness in right leg and still has weakness.   He is out of neurontin (took last one this morning). He is out of oxycodone as well.   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    PHYSICAL EXAMINATION:  General: Patient is well developed, well nourished, calm, collected, and in no apparent distress.   NEUROLOGICAL:  General: In no acute distress.   Awake, alert, oriented to person, place, and time.  Pupils equal round and reactive to light.  Facial tone is symmetric.     Strength:          Side Iliopsoas Quads Hamstring PF DF EHL  R 5 5 5 5 3 2   L 5 5 5 5 5 5    Incision c/d/i   ROS (Neurologic):  Negative except as noted above  IMAGING: Nothing new to review.   ASSESSMENT/PLAN:  Dean Bishop is doing reasonable s/p above surgery. He continues with significant right leg pain. Treatment options reviewed with patient and following plan made:   - I have advised the patient to lift up to 10 pounds until 6 weeks after surgery (follow up with Dr. Katrinka Blazing).  - Reviewed wound care.  - No bending, twisting, or lifting.  - Continue on current medications including prn oxycodone, prn robaxin, and neurontin. PMP reviewed and is appropriate. Refills of all neurontin and oxycodone sent to pharmacy.  - Medrol dose pack sent to help with right leg pain as well. History of getting hoarse with inhaled steroids. No issues with oral steroids (has taken in the past).  - No driving if he is taking  oxycodone. Recommend he not take oxycodone when working as well.  - He will have to travel for work prior to follow up. He will stop frequently to stir around.   - Follow up as scheduled in 4 weeks and prn.   Advised to contact the office if any questions or concerns arise.  [name] PA-C Department of neurosurgery

## 2022-11-16 ENCOUNTER — Encounter: Payer: Self-pay | Admitting: Orthopedic Surgery

## 2022-11-16 ENCOUNTER — Ambulatory Visit: Payer: 59 | Admitting: Orthopedic Surgery

## 2022-11-16 VITALS — BP 130/82 | Temp 99.4°F | Ht 74.0 in | Wt 257.0 lb

## 2022-11-16 DIAGNOSIS — Z9889 Other specified postprocedural states: Secondary | ICD-10-CM

## 2022-11-16 DIAGNOSIS — M5126 Other intervertebral disc displacement, lumbar region: Secondary | ICD-10-CM

## 2022-11-16 DIAGNOSIS — Z09 Encounter for follow-up examination after completed treatment for conditions other than malignant neoplasm: Secondary | ICD-10-CM

## 2022-11-16 MED ORDER — OXYCODONE HCL 5 MG PO TABS
5.0000 mg | ORAL_TABLET | Freq: Four times a day (QID) | ORAL | 0 refills | Status: DC | PRN
Start: 2022-11-16 — End: 2022-12-19

## 2022-11-16 MED ORDER — GABAPENTIN 100 MG PO CAPS
200.0000 mg | ORAL_CAPSULE | Freq: Three times a day (TID) | ORAL | 1 refills | Status: DC
Start: 2022-11-16 — End: 2023-03-08

## 2022-11-16 MED ORDER — METHYLPREDNISOLONE 4 MG PO TBPK
ORAL_TABLET | ORAL | 0 refills | Status: DC
Start: 2022-11-16 — End: 2022-12-19

## 2022-11-16 NOTE — Telephone Encounter (Signed)
See message. No issues with oral steroids, only had problem with inhaled steroids.   Will send in medrol dose pack.

## 2022-11-20 ENCOUNTER — Encounter: Payer: 59 | Admitting: Orthopedic Surgery

## 2022-11-21 ENCOUNTER — Encounter: Payer: 59 | Admitting: Orthopedic Surgery

## 2022-11-28 ENCOUNTER — Telehealth: Payer: Self-pay

## 2022-11-28 NOTE — Telephone Encounter (Signed)
 Pt requested call back to schedule colonoscopy

## 2022-12-04 ENCOUNTER — Other Ambulatory Visit: Payer: Self-pay

## 2022-12-04 ENCOUNTER — Telehealth: Payer: Self-pay

## 2022-12-04 DIAGNOSIS — Z1211 Encounter for screening for malignant neoplasm of colon: Secondary | ICD-10-CM

## 2022-12-04 MED ORDER — NA SULFATE-K SULFATE-MG SULF 17.5-3.13-1.6 GM/177ML PO SOLN: 1.0000 | Freq: Once | ORAL | 0 refills | Status: AC

## 2022-12-04 NOTE — Telephone Encounter (Signed)
Gastroenterology Pre-Procedure Review  Request Date: 01/21/23 Requesting Physician: Dr. Allegra Lai  PATIENT REVIEW QUESTIONS: The patient responded to the following health history questions as indicated:    1. Are you having any GI issues? no 2. Do you have a personal history of Polyps? no 3. Do you have a family history of Colon Cancer or Polyps? no 4. Diabetes Mellitus? no 5. Joint replacements in the past 12 months? Ruptured disc 11/06/22 6. Major health problems in the past 3 months?no 7. Any artificial heart valves, MVP, or defibrillator?no    MEDICATIONS & ALLERGIES:    Patient reports the following regarding taking any anticoagulation/antiplatelet therapy:   Plavix, Coumadin, Eliquis, Xarelto, Lovenox, Pradaxa, Brilinta, or Effient? no Aspirin? no  Patient confirms/reports the following medications:  Current Outpatient Medications  Medication Sig Dispense Refill   calcium carbonate (TUMS EX) 750 MG chewable tablet Chew 1 tablet by mouth as needed for heartburn.     FLUoxetine (PROZAC) 10 MG capsule TAKE 1 CAPSULE BY MOUTH DAILY 90 capsule 1   gabapentin (NEURONTIN) 100 MG capsule Take 2 capsules (200 mg total) by mouth 3 (three) times daily. 180 capsule 1   ibuprofen (ADVIL) 800 MG tablet Take 800 mg by mouth every 8 (eight) hours as needed.     lisinopril (ZESTRIL) 10 MG tablet TAKE 1 TABLET BY MOUTH ONCE  DAILY 90 tablet 3   meclizine (ANTIVERT) 12.5 MG tablet TAKE 1 TO 2 TABLETS(12.5 TO 25 MG) BY MOUTH THREE TIMES DAILY AS NEEDED FOR DIZZINESS 30 tablet 3   methocarbamol (ROBAXIN) 500 MG tablet Take 1 tablet (500 mg total) by mouth 4 (four) times daily. 120 tablet 0   methylPREDNISolone (MEDROL DOSEPAK) 4 MG TBPK tablet Use as directed x 6 days. 1 each 0   oxyCODONE (ROXICODONE) 5 MG immediate release tablet Take 1 tablet (5 mg total) by mouth every 6 (six) hours as needed for severe pain. 28 tablet 0   pravastatin (PRAVACHOL) 80 MG tablet TAKE 1 TABLET BY MOUTH DAILY 90 tablet 3    senna (SENOKOT) 8.6 MG TABS tablet Take 1 tablet (8.6 mg total) by mouth daily as needed for mild constipation. 30 tablet 0   No current facility-administered medications for this visit.    Patient confirms/reports the following allergies:  Allergies  Allergen Reactions   Advair Diskus [Fluticasone-Salmeterol]     Hoarse voice   Lipitor [Atorvastatin Calcium] Other (See Comments)    aches    No orders of the defined types were placed in this encounter.   AUTHORIZATION INFORMATION Primary Insurance: 1D#: Group #:  Secondary Insurance: 1D#: Group #:  SCHEDULE INFORMATION: Date: 01/21/23 Time: Location: [facility]

## 2022-12-19 ENCOUNTER — Ambulatory Visit (INDEPENDENT_AMBULATORY_CARE_PROVIDER_SITE_OTHER): Payer: 59 | Admitting: Neurosurgery

## 2022-12-19 ENCOUNTER — Encounter: Payer: Self-pay | Admitting: Neurosurgery

## 2022-12-19 VITALS — BP 132/82 | Temp 98.6°F | Ht 74.0 in | Wt 257.0 lb

## 2022-12-19 DIAGNOSIS — M5416 Radiculopathy, lumbar region: Secondary | ICD-10-CM

## 2022-12-19 DIAGNOSIS — M5126 Other intervertebral disc displacement, lumbar region: Secondary | ICD-10-CM

## 2022-12-19 DIAGNOSIS — M461 Sacroiliitis, not elsewhere classified: Secondary | ICD-10-CM

## 2022-12-19 DIAGNOSIS — Z9889 Other specified postprocedural states: Secondary | ICD-10-CM

## 2022-12-19 NOTE — Progress Notes (Signed)
   REFERRING PHYSICIAN:  Joaquim Nam, Md [address] [place],  [place] [address]  DOS: 11/06/22  right sided L4-L5 microdiscectomy   HISTORY OF PRESENT ILLNESS: Dean Bishop is approximately 6 weeks status post right sided L4-L5 microdiscectomy . Was given robaxin and oxycodone on discharge from the hospital.  He has had some intermittent muscle cramping.  He does notice that he has significant improvement in the strength.   PHYSICAL EXAMINATION:  General: Patient is well developed, well nourished, calm, collected, and in no apparent distress.   NEUROLOGICAL:  General: In no acute distress.   Awake, alert, oriented to person, place, and time.  Pupils equal round and reactive to light.  Facial tone is symmetric.     Strength:          Side Iliopsoas Quads Hamstring PF DF EHL  R 5 5 5 5 4 2   L 5 5 5 5 5 5    Incision c/d/i   ROS (Neurologic):  Negative except as noted above  IMAGING: Nothing new to review.   ASSESSMENT/PLAN:  Dean Bishop is doing reasonable s/p above surgery.  He notices that the neuropathic pain has improved significantly, however his muscle cramping has been intermittent and does cause him some significant discomfort.  On physical examination, he does have positive Fortin's finger test as well as torsion test Gaenslen's test, and compression test positive for SI joint irritation.  Curious whether or not he is developed a sacroiliitis given the change in his gait, made a referral to pain management for possible injection of his SI joint as this pain in his medial upper buttocks area is causing him the most discomfort at this point.  He states that if he supports that area with his hand that his pain is improved significantly.  Advised to contact the office if any questions or concerns arise.  Dean Kim, MD [facility]

## 2022-12-20 ENCOUNTER — Ambulatory Visit
Payer: 59 | Attending: Student in an Organized Health Care Education/Training Program | Admitting: Student in an Organized Health Care Education/Training Program

## 2022-12-20 ENCOUNTER — Encounter: Payer: Self-pay | Admitting: Student in an Organized Health Care Education/Training Program

## 2022-12-20 VITALS — BP 123/93 | HR 77 | Temp 99.5°F | Resp 16 | Ht 74.0 in | Wt 257.0 lb

## 2022-12-20 DIAGNOSIS — Z9889 Other specified postprocedural states: Secondary | ICD-10-CM | POA: Diagnosis present

## 2022-12-20 DIAGNOSIS — M533 Sacrococcygeal disorders, not elsewhere classified: Secondary | ICD-10-CM | POA: Diagnosis present

## 2022-12-20 DIAGNOSIS — G5701 Lesion of sciatic nerve, right lower limb: Secondary | ICD-10-CM

## 2022-12-20 NOTE — Progress Notes (Signed)
Safety precautions to be maintained throughout the outpatient stay will include: orient to surroundings, keep bed in low position, maintain call bell within reach at all times, provide assistance with transfer out of bed and ambulation.  

## 2022-12-20 NOTE — Progress Notes (Signed)
Patient: Dean Bishop  Service Category: E/M  Provider: Edward Jolly, MD  DOB: Nov 17, 1965  DOS: 12/20/2022  Referring Provider: Lovenia Kim, MD  MRN: [id number]  Setting: Ambulatory outpatient  PCP: Joaquim Nam, MD  Type: New Patient  Specialty: Interventional Pain Management    Location: Office  Delivery: Face-to-face     Primary Reason(s) for Visit: Encounter for initial evaluation of one or more chronic problems (new to examiner) potentially causing chronic pain, and posing a threat to normal musculoskeletal function. (Level of risk: High) CC: Hip Pain (Right ) and Foot Pain (Right )  HPI  Dean Bishop is a 57 y.o. year old, male patient, who comes for the first time to our practice referred by Lovenia Kim, MD for our initial evaluation of his chronic pain. He has HLD (hyperlipidemia); HEMORRHOIDS, INTERNAL, THROMBOSED; IRRITABILITY; ALLERGY, ENVIRONMENTAL; Essential hypertension, benign; Parsonage-Turner syndrome; Vertigo; Asthma; Pain in joint, shoulder region; Routine general medical examination at a health care facility; Advance care planning; Low back pain; Elbow pain; Smoking; Other social stressor; Right lumbar radiculopathy; HNP (herniated nucleus pulposus), lumbar; Right leg weakness; Sacroiliac joint dysfunction of right side; Piriformis syndrome of right side; and Status post lumbar microdiscectomy on their problem list. Today he comes in for evaluation of his Hip Pain (Right ) and Foot Pain (Right )  Pain Assessment: Location: Right Hip Radiating: It can radiate with certain positons up to lower right back and also having feet pain Onset: More than a month ago Duration: Chronic pain Quality: Burning, Numbness, Tingling, Aching, Discomfort, Radiating Severity: 2 /10 (subjective, self-reported pain score)  Effect on ADL: Limits ADLs Timing: Constant Modifying factors: Frequent position changes, pillows between knees help BP: (!) 123/93  HR: 77  Onset and Duration:  Sudden Cause of pain: Work related accident or event Severity: No change since onset, NAS-11 at its worse: 9/10, NAS-11 at its best: 2/10, NAS-11 now: 2/10, and NAS-11 on the average: 5/10 Timing: Morning, Afternoon, Night, During activity or exercise, After activity or exercise, and After a period of immobility Aggravating Factors: Prolonged standing, Squatting, Twisting, Walking, and Working Alleviating Factors: Resting Associated Problems: Day-time cramps, Night-time cramps, Numbness, Swelling, Tingling, Pain that wakes patient up, and Pain that does not allow patient to sleep Quality of Pain: Aching, Agonizing, Burning, Intermittent, and Sharp   Dean Bishop is being evaluated for possible interventional pain management therapies for the treatment of his chronic pain.   Dean Bishop is a pleasant 57 year old male who presents with a chief complaint of right buttock pain and pain that radiates into his right subgluteal region and also his hip.  He is status post right L4-L5 microdiscectomy on 11/06/2022.  He states that the electrical sensation in his right leg is somewhat better he continues to have right leg pain.  He states that the majority of his pain is in his buttock area, overlying his SI joint and piriformis.  He has been evaluated by neurosurgery.  There is concern for right sacroiliitis and he is being referred to me to consider a right diagnostic SI joint injection.  He denies any groin pain.  Meds   Current Outpatient Medications:    calcium carbonate (TUMS EX) 750 MG chewable tablet, Chew 1 tablet by mouth as needed for heartburn. PRN, Disp: , Rfl:    FLUoxetine (PROZAC) 10 MG capsule, TAKE 1 CAPSULE BY MOUTH DAILY, Disp: 90 capsule, Rfl: 1   gabapentin (NEURONTIN) 100 MG capsule, Take 2 capsules (200 mg total)  by mouth 3 (three) times daily., Disp: 180 capsule, Rfl: 1   ibuprofen (ADVIL) 800 MG tablet, Take 800 mg by mouth every 8 (eight) hours as needed., Disp: , Rfl:    lisinopril  (ZESTRIL) 10 MG tablet, TAKE 1 TABLET BY MOUTH ONCE  DAILY, Disp: 90 tablet, Rfl: 3   meclizine (ANTIVERT) 12.5 MG tablet, TAKE 1 TO 2 TABLETS(12.5 TO 25 MG) BY MOUTH THREE TIMES DAILY AS NEEDED FOR DIZZINESS, Disp: 30 tablet, Rfl: 3   methocarbamol (ROBAXIN) 500 MG tablet, Take 1 tablet (500 mg total) by mouth 4 (four) times daily., Disp: 120 tablet, Rfl: 0   pravastatin (PRAVACHOL) 80 MG tablet, TAKE 1 TABLET BY MOUTH DAILY, Disp: 90 tablet, Rfl: 3  Imaging Review    Narrative *RADIOLOGY REPORT*  Clinical Data:  Episodic bilateral arm numbness and weakness, right greater than left, for 1 week.  MRI CERVICAL SPINE WITHOUT AND WITH CONTRAST  Technique:  Multiplanar and multiecho pulse sequences of the cervic al spine, to include the craniocervical junction and cervicothoraci c junction, were obtained without and with intravenous contrast.  Contrast: 20mL MULTIHANCE GADOBENATE DIMEGLUMINE 529 MG/ML IV SOLN  Comparison:  Chest radiographs 08/17/2007.  Findings:  The cervical alignment is normal.  There is no evidence of fracture or paraspinal abnormality.  The craniocervical junction appears normal.  The cervical cord is normal in signal and caliber.  There is no abnormal intradural or paraspinal enhancement.  There are no significant disc space findings at or above C3-C4.  C4-C5:  There is mild uncinate spurring and facet hypertrophy asymmetric to the right.  There is resulting moderate right greater than left foraminal stenosis.  No cord deformity is present.  C5-C6:  There is a shallow right paracentral disc protrusion with bilateral uncinate spur.  The AP diameter of the canal is mildly narrowed to 9 mm.  There is no cord deformity.  The left foramen appears moderately narrowed.  The right foramen is patent.  C6-C7:  There is disc bulging with mild uncinate spurring bilaterally.  No cord deformity or significant foraminal compromise results.  C7-T1:  No significant  findings.  IMPRESSION:  1.  Mild spinal stenosis at C5-C6.  There is no cord deformity or abnormal cord signal / enhancement. 2.  Moderate foraminal narrowing right greater than left at C4-C5 and on the left at C5-C6 due to uncinate spur.  The potential for exiting nerve root encroachment seems greatest on the left at C5- C6.  MRI BRACHIAL PLEXUS WITHOUT CONTRAST  Technique:  Multiplanar, multiecho pulse sequences of the neck and surrounding structures were obtained  without intravenous contrast. The field of view was focused on the right brachial plexus from the neural foramina to the axilla.  Findings:  There is a mild cervical thoracic scoliosis, convex to the left in the upper thoracic region.  No vertebral body anomalies are identified.  There is no evidence of paraspinal mass, inflammation or abnormal enhancement.  There is no abnormal signal or enhancement associated with the right brachial plexus.  No cervical or supraclavicular adenopathy is identified. There is a probable small incidental retention cyst associated with the posterior supraglottic region on the left. This does not show any abnormal enhancement.  There is a large multi septated cystic lesion involving the suprascapular and spinoglenoid notches of the right shoulder.  This is incompletely evaluated by the current examination but measures up to 3.4 cm in diameter.  There is erosion of the scapular body in the suprascapular notch.  This most likely reflect represents a paralabral cyst related to an underlying labral tear.  No denervation changes are identified within the visualized right shoulder musculature.  IMPRESSION:  1.  No abnormality the right brachial plexus identified. 2.  Large paralabral cyst associated with the suprascapular and spinoglenoid notches of the right shoulder.  This likely indicates an underlying superior labral tear, not demonstrated by the current examination. This could  result in suprascapular nerve impingement and contribute to the patient's symptoms.  No denervation changes are visualized within the periarticular musculature.  Original Report Authenticated By: Gerrianne Scale, M.D.  Cervical DG complete: Results for orders placed in visit on 07/06/14  DG Cervical Spine Complete  Narrative CLINICAL DATA:  57 year old male with chronic upper extremity radicular pain and numbness. Current history of Parsonage Turner Syndrome. Subsequent encounter.  EXAM: CERVICAL SPINE  4+ VIEWS  COMPARISON:  Cervical spine MRI 01/28/2011.  FINDINGS: Stable cervical vertebral height and alignment. Prevertebral soft tissue contour within normal limits. Cervicothoracic junction alignment is within normal limits. Chronic disc and endplate degeneration at C5-C6 and C6-C7. Bilateral posterior element alignment is within normal limits. Cervicothoracic S-shaped scoliosis is partially visible. Lung apices within normal limits. Normal C1-C2 alignment.  IMPRESSION: No acute osseous abnormality identified. In the cervical spine. Chronic disc and endplate degeneration at C5-C6 and C6-C7.   Electronically Signed By: Odessa Fleming M.D. On: 07/06/2014 11:44    MR Shoulder Right W Contrast  Narrative CLINICAL DATA:  Right shoulder pain and limited range of motion for 5 months since an injury while performing a swinging motion.  EXAM: MR ARTHROGRAM OF THE RIGHT SHOULDER  TECHNIQUE: Multiplanar, multisequence MR imaging of the right shoulder was performed following the administration of intra-articular contrast.  CONTRAST:  See Injection Documentation.  COMPARISON:  None.  FINDINGS: Rotator cuff: Mildly increased T2 signal in the supraspinatus and infraspinatus tendons consistent with tendinopathy is identified. No tear.  Muscles: No atrophy or focal lesion.  Biceps long head: Longitudinal split tearing of the tendon at and just distal to the labrum is  identified.  Acromioclavicular Joint: Mild degenerative disease is seen.  Glenohumeral Joint: Unremarkable.  Labrum: There is a tear of the superior labrum extending from the 1 o'clock position anteriorly to the 10 o'clock position posteriorly. A paralabral cyst measuring 3.5 cm transverse by 1.2 cm AP by 1.4 cm craniocaudal is seen at the 10 o'clock position of the glenoid.  Bones: No fracture or worrisome lesion. The acromion is type 1. Tiny amount of fluid is seen in the subacromial/subdeltoid bursa. There is no contrast in the bursa.  IMPRESSION: Large SLAP tear extends into the intra-articular long head of biceps tendon consistent with a type 4 injury. Associated large paralabral cyst at the 10 o'clock position is identified.  Mild appearing supraspinatus and infraspinatus tendinopathy without tear.  Mild acromioclavicular osteoarthritis.  Small volume of subacromial/subdeltoid fluid consistent with bursitis.   Electronically Signed By: Drusilla Kanner M.D. On: 01/30/2016 16:15   DG Arthro Shoulder Right  Narrative INDICATION: Chronic right shoulder pain. Injury in college 30 years ago with re-injury 4 months ago.  EXAM: ARTHROCENTESIS OF right shoulder JOINT UNDER FLUOROSCOPIC GUIDANCE prior to MRI  COMPARISON:  Right shoulder series of October 10, 2015  FLUOROSCOPY TIME:  Fluoroscopy Time:  0 minutes, 24 seconds  Radiation Exposure Index (if provided by the fluoroscopic device): 70.03 micro Gy per meters squared  Number of Acquired Spot Images: 1  COMPLICATIONS: None immediate.  PROCEDURE: Informed written  consent was obtained from the patient after discussion of the risks, benefits and alternatives to treatment. The patient was placed supine on the fluoroscopy table and the right shoulder was placed in external rotation. The right shoulder joint was localized with fluoroscopy. The skin overlying the anterior aspect of the right shoulder was prepped  and draped in usual sterile fashion. A 22 gauge spinal needle was advanced into the anterior medial aspect of the right shoulder joint, after the overlying soft tissues were anesthetized with 1% lidocaine. A fluoroscopic image was saved and sent to PACs.  A test injection was made with a solution of Isovue-300 and normal saline to confirm intra-articular positioning of the needle. Subsequently 12 cc of a solution of 0.1 cc of MultiHance, 6 cc of 1% lidocaine, and 6 cc of normal saline was instilled into the right shoulder joint. The needle was removed and a dressing was placed. The patient tolerated procedure well without immediate postprocedural complication. The patient was taken to the MRI suite in good condition.  IMPRESSION: Successful fluoroscopic guided injection of the right shoulder joint prior to MRI.   Electronically Signed By: David  [place] M.D. On: 01/30/2016 11:56  Narrative CLINICAL DATA:  Right shoulder pain  EXAM: RIGHT SHOULDER - 2+ VIEW  COMPARISON:  None.  FINDINGS: There is no evidence of fracture or dislocation. There is no evidence of arthropathy or other focal bone abnormality. Soft tissues are unremarkable.  IMPRESSION: Negative.   Electronically Signed By: Paulina Fusi M.D. On: 10/11/2015 07:53   DG Thoracic Spine W/Swimmers  Narrative CLINICAL DATA:  57 year old male with chronic upper extremity radicular pain and numbness. Current history of Parsonage Turner Syndrome. Subsequent encounter.  EXAM: THORACIC SPINE - 2 VIEW + SWIMMERS  COMPARISON:  Cervical spine radiographs from today reported separately. Portable chest radiograph 08/17/2007.  FINDINGS: Bone mineralization is within normal limits. Normal thoracic segmentation. Cervicothoracic scoliosis, with dextro convex cervicothoracic junction level curvature. Otherwise thoracic vertebral height and alignment within normal limits. Disc spaces are preserved. Posterior ribs  appear intact. Thoracic visceral contours are within normal limits.  IMPRESSION: Negative radiographic appearance of the thoracic spine aside from mild scoliosis.   Electronically Signed By: Odessa Fleming M.D. On: 07/06/2014 11:46    Narrative CLINICAL DATA:  Right lumbar radiculopathy, difficulty walking.  EXAM: MRI LUMBAR SPINE WITHOUT CONTRAST  TECHNIQUE: Multiplanar, multisequence MR imaging of the lumbar spine was performed. No intravenous contrast was administered.  COMPARISON:  Lumbar spine MRI 11/26/2015  FINDINGS: Segmentation: Standard; the lowest formed disc space is designated L5-S1.  Alignment: There is straightening of the normal lumbar lordosis. Trace retrolisthesis of L4 on L5 and L5 on S1 are unchanged.  Vertebrae: Background marrow signal is normal. Scattered prominent Schmorl's nodes with associated endplate indentation are unchanged. There is no evidence of acute fracture. There is mild degenerative endplate signal abnormality without edema at L4-L5 and L5-S1. There is no suspicious marrow signal abnormality or marrow edema.  Conus medullaris and cauda equina: Conus extends to the L1-L2 level. Conus and cauda equina appear normal.  Paraspinal and other soft tissues: Unremarkable.  Disc levels:  T12-L1: Unremarkable.  L1-L2: Unremarkable.  L2-L3: There is a shallow disc protrusion and mild left facet arthropathy without significant spinal canal or neural foraminal stenosis, not significantly changed.  L3-L4: There is a shallow disc protrusion and annular fissure without significant spinal canal or neural foraminal stenosis, not significantly changed.  L4-L5: There is a broad-based right subarticular zone protrusion with an inferiorly  migrated extrusion extending approximately 1.0 cm below the level of the disc space and mild facet arthropathy resulting in mild spinal canal stenosis with right worse than left subarticular zone narrowing,  impingement of the traversing right L5 nerve root, and possible impingement of the traversing left L5 nerve root no significant neural foraminal stenosis. The disc protrusion is worsened since 2017.  L5-S1: There is disc desiccation and narrowing with bilateral endplate spurring resulting in mild left and no significant right neural foraminal stenosis and no significant spinal canal stenosis, unchanged.  IMPRESSION: 1. Broad-based right subarticular zone disc protrusion and inferiorly migrated extrusion at L4-L5 which impinges the traversing right L5 nerve root and may impinge the traversing left L5 nerve root. 2. Disc space narrowing most advanced at L5-S1 without high-grade spinal canal or neural foraminal stenosis.   Electronically Signed By: Lesia Hausen M.D. On: 10/23/2022 15:05   DG Lumbar Spine 2-3 Views  Narrative CLINICAL DATA:  L4-L5 microdiscectomy  EXAM: LUMBAR SPINE - 2-3 VIEW; DG C-ARM 1-60 MIN-NO REPORT  COMPARISON:  None Available.  FLUOROSCOPY: Air kerma 7.74 mGy  FINDINGS: Intraoperative fluoroscopic images of the lumbar spine demonstrate posterior tubular retractor and instrumentation at the level of L4-L5.  IMPRESSION: Intraoperative fluoroscopic images of the lumbar spine demonstrate posterior tubular retractor and instrumentation at the level of L4-L5.   Electronically Signed By: Jearld Lesch M.D. On: 11/06/2022 12:57  Lumbar DG (Complete) 4+V: Results for orders placed during the hospital encounter of 10/08/22  DG Lumbar Spine Complete  Narrative CLINICAL DATA:  R radiculopathy, diminished DTRs on right  EXAM: LUMBAR SPINE - COMPLETE 4+ VIEW  COMPARISON:  October 31, 2015  FINDINGS: Evaluation is limited by underpenetration. There are five non-rib bearing lumbar-type vertebral bodies. Trace retrolisthesis L4-5. straightening of the lumbar lordosis. There is no evidence for acute fracture or subluxation. Intervertebral disc  spaces are relatively preserved. Mild lower lumbar facet arthropathy. Visualized abdomen is unremarkable.  IMPRESSION: Mild lower lumbar facet arthropathy with trace retrolisthesis of L4-5.   Electronically Signed By: Meda Klinefelter M.D. On: 10/14/2022 11:18  Complexity Note: Imaging results reviewed.                         ROS  Cardiovascular: High blood pressure Pulmonary or Respiratory: Wheezing and difficulty taking a deep full breath (Asthma) and Snoring  Neurological: No reported neurological signs or symptoms such as seizures, abnormal skin sensations, urinary and/or fecal incontinence, being born with an abnormal open spine and/or a tethered spinal cord Psychological-Psychiatric: No reported psychological or psychiatric signs or symptoms such as difficulty sleeping, anxiety, depression, delusions or hallucinations (schizophrenial), mood swings (bipolar disorders) or suicidal ideations or attempts Gastrointestinal: No reported gastrointestinal signs or symptoms such as vomiting or evacuating blood, reflux, heartburn, alternating episodes of diarrhea and constipation, inflamed or scarred liver, or pancreas or irrregular and/or infrequent bowel movements Genitourinary: No reported renal or genitourinary signs or symptoms such as difficulty voiding or producing urine, peeing blood, non-functioning kidney, kidney stones, difficulty emptying the bladder, difficulty controlling the flow of urine, or chronic kidney disease Hematological: No reported hematological signs or symptoms such as prolonged bleeding, low or poor functioning platelets, bruising or bleeding easily, hereditary bleeding problems, low energy levels due to low hemoglobin or being anemic Endocrine: No reported endocrine signs or symptoms such as high or low blood sugar, rapid heart rate due to high thyroid levels, obesity or weight gain due to slow thyroid or thyroid  disease Rheumatologic: No reported rheumatological  signs and symptoms such as fatigue, joint pain, tenderness, swelling, redness, heat, stiffness, decreased range of motion, with or without associated rash Musculoskeletal: Negative for myasthenia gravis, muscular dystrophy, multiple sclerosis or malignant hyperthermia Work History: Working full time  Allergies  Mr. Biebel is allergic to advair diskus [fluticasone-salmeterol] and lipitor [atorvastatin calcium].  Laboratory Chemistry Profile   Renal Lab Results  Component Value Date   BUN 24 (H) 11/02/2022   CREATININE 0.89 11/02/2022   GFR 78.22 05/28/2022   GFRNONAA >60 11/02/2022   PROTEINUR NEGATIVE 11/02/2022     Electrolytes Lab Results  Component Value Date   NA 136 11/02/2022   K 4.3 11/02/2022   CL 103 11/02/2022   CALCIUM 9.5 11/02/2022   PHOS 3.6 01/18/2011     Hepatic Lab Results  Component Value Date   AST 16 05/28/2022   ALT 20 05/28/2022   ALBUMIN 4.2 05/28/2022   ALKPHOS 47 05/28/2022     ID Lab Results  Component Value Date   SARSCOV2NAA NEGATIVE 12/28/2019   STAPHAUREUS NEGATIVE 11/02/2022   MRSAPCR NEGATIVE 11/02/2022     Bone No results found for: "VD25OH", "VD125OH2TOT", "MW4132GM0", "NU2725DG6", "25OHVITD1", "25OHVITD2", "25OHVITD3", "TESTOFREE", "TESTOSTERONE"   Endocrine Lab Results  Component Value Date   GLUCOSE 120 (H) 11/02/2022   GLUCOSEU NEGATIVE 11/02/2022   TSH 0.74 01/18/2011     Neuropathy No results found for: "VITAMINB12", "FOLATE", "HGBA1C", "HIV"   CNS No results found for: "COLORCSF", "APPEARCSF", "RBCCOUNTCSF", "WBCCSF", "POLYSCSF", "LYMPHSCSF", "EOSCSF", "PROTEINCSF", "GLUCCSF", "JCVIRUS", "CSFOLI", "IGGCSF", "LABACHR", "ACETBL"   Inflammation (CRP: Acute  ESR: Chronic) Lab Results  Component Value Date   ESRSEDRATE 5 02/14/2011     Rheumatology No results found for: "RF", "ANA", "LABURIC", "URICUR", "LYMEIGGIGMAB", "LYMEABIGMQN", "HLAB27"   Coagulation Lab Results  Component Value Date   PLT 251 11/02/2022      Cardiovascular Lab Results  Component Value Date   CKTOTAL 179 01/25/2011   HGB 14.5 11/02/2022   HCT 42.4 11/02/2022     Screening Lab Results  Component Value Date   SARSCOV2NAA NEGATIVE 12/28/2019   STAPHAUREUS NEGATIVE 11/02/2022   MRSAPCR NEGATIVE 11/02/2022     Cancer No results found for: "CEA", "CA125", "LABCA2"   Allergens No results found for: "ALMOND", "APPLE", "ASPARAGUS", "AVOCADO", "BANANA", "BARLEY", "BASIL", "BAYLEAF", "GREENBEAN", "LIMABEAN", "WHITEBEAN", "BEEFIGE", "REDBEET", "BLUEBERRY", "BROCCOLI", "CABBAGE", "MELON", "CARROT", "CASEIN", "CASHEWNUT", "CAULIFLOWER", "CELERY"     Note: Lab results reviewed.  PFSH  Drug: Dean Bishop  reports no history of drug use. Alcohol:  reports current alcohol use. Tobacco:  reports that he quit smoking about 21 years ago. His smoking use included cigarettes. He quit smokeless tobacco use about 22 years ago. Medical:  has a past medical history of Allergy, Anxiety, Asthma, Depression, Hemorrhoids, internal, thrombosed, HLD (hyperlipidemia), HNP (herniated nucleus pulposus), lumbar, Hyperlipidemia, Hypertension, Internal thrombosed hemorrhoids, Irritability, Parsonage-Turner syndrome, Right leg weakness, Right lumbar radiculopathy, and Vertigo. Family: family history includes Alcohol abuse in his father; Diabetes in his father; Heart disease in his father and paternal grandfather; Hypertension in his brother, mother, and sister.  Past Surgical History:  Procedure Laterality Date   LUMBAR LAMINECTOMY/DECOMPRESSION MICRODISCECTOMY Right 11/06/2022   Procedure: RIGHT L4-5 MICRODISCECTOMY;  Surgeon: Loreen Freud, MD;  Location: [facility] ORS;  Service: Neurosurgery;  Laterality: Right;   SHOULDER SURGERY Right 2017   Active Ambulatory Problems    Diagnosis Date Noted   HLD (hyperlipidemia) 05/04/2009   HEMORRHOIDS, INTERNAL, THROMBOSED 06/27/2009   IRRITABILITY 11/04/2009  ALLERGY, ENVIRONMENTAL 08/13/2007   Essential  hypertension, benign 01/18/2011   Parsonage-Turner syndrome 04/03/2011   Vertigo 12/16/2011   Asthma 05/05/2013   Pain in joint, shoulder region 10/10/2015   Routine general medical examination at a health care facility 07/15/2016   Advance care planning 07/15/2016   Low back pain 01/15/2018   Elbow pain 09/09/2019   Smoking 09/20/2021   Other social stressor 05/08/2022   Right lumbar radiculopathy 10/08/2022   HNP (herniated nucleus pulposus), lumbar 11/01/2022   Right leg weakness 11/01/2022   Sacroiliac joint dysfunction of right side 12/20/2022   Piriformis syndrome of right side 12/20/2022   Status post lumbar microdiscectomy 12/20/2022   Resolved Ambulatory Problems    Diagnosis Date Noted   DEPRESSION/ANXIETY 08/13/2007   OTITIS EXTERNA, RIGHT 06/27/2009   SOB 08/13/2007   SINUSITIS - ACUTE-NOS 03/14/2010   AOM (acute otitis media) 06/15/2010   Myositis 01/18/2011   Tick bite 09/17/2011   Viral syndrome 02/09/2012   Viral pharyngitis 10/07/2012   Acute bacterial sinusitis 10/29/2012   Sore throat 12/09/2012   URI, acute 09/29/2013   ETD (eustachian tube dysfunction) 03/08/2014   Strep pharyngitis 05/28/2016   Alcohol use 06/11/2018   Past Medical History:  Diagnosis Date   Allergy    Anxiety    Asthma    Depression    Hemorrhoids, internal, thrombosed    Hyperlipidemia    Hypertension    Constitutional Exam  General appearance: Well nourished, well developed, and well hydrated. In no apparent acute distress Vitals:   12/20/22 1045 12/20/22 1108  BP: (!) 151/102 (!) 123/93  Pulse: 75 77  Resp: 16   Temp: 99.5 F (37.5 C)   TempSrc: Temporal   SpO2: 96%   Weight: 257 lb (116.6 kg)   Height: 6\' 2"  (1.88 m)    BMI Assessment: Estimated body mass index is 33 kg/m as calculated from the following:   Height as of this encounter: 6\' 2"  (1.88 m).   Weight as of this encounter: 257 lb (116.6 kg).  BMI interpretation table: BMI level Category Range  association with higher incidence of chronic pain  <18 kg/m2 Underweight   18.5-24.9 kg/m2 Ideal body weight   25-29.9 kg/m2 Overweight Increased incidence by 20%  30-34.9 kg/m2 Obese (Class I) Increased incidence by 68%  35-39.9 kg/m2 Severe obesity (Class II) Increased incidence by 136%  >40 kg/m2 Extreme obesity (Class III) Increased incidence by 254%   Patient's current BMI Ideal Body weight  Body mass index is 33 kg/m. Ideal body weight: 82.2 kg (181 lb 3.5 oz) Adjusted ideal body weight: 95.9 kg (211 lb 8.5 oz)   BMI Readings from Last 4 Encounters:  12/20/22 33.00 kg/m  12/19/22 33.00 kg/m  11/16/22 33.00 kg/m  11/06/22 33.06 kg/m   Wt Readings from Last 4 Encounters:  12/20/22 257 lb (116.6 kg)  12/19/22 257 lb (116.6 kg)  11/16/22 257 lb (116.6 kg)  11/06/22 257 lb 8 oz (116.8 kg)    Psych/Mental status: Alert, oriented x 3 (person, place, & time)       Eyes: PERLA Respiratory: No evidence of acute respiratory distress  Lumbar Spine Area Exam  Skin & Axial Inspection: Well healed scar from previous spine surgery detected Alignment: Symmetrical Functional ROM: Unrestricted ROM       Stability: No instability detected Muscle Tone/Strength: Functionally intact. No obvious neuro-muscular anomalies detected. Sensory (Neurological): Referred pain pattern -SI-J Palpation: No palpable anomalies       Provocative Tests:  Patrick's  Maneuver: (+) for right-sided S-I arthralgia  FABER* test: (+) for right-sided S-I arthralgia  S-I anterior distraction/compression test: (+) for right-sided S-I arthralgia  S-I lateral compression test: (+) for right-sided S-I arthralgia  S-I Thigh-thrust test: (+) for right-sided S-I arthralgia  S-I Gaenslen's test:(+) for right-sided S-I arthralgia    Gait & Posture Assessment  Ambulation: Unassisted Gait: Relatively normal for age and body habitus Posture: WNL   Lower Extremity Exam    Side: Right lower extremity  Side: Left  lower extremity  Stability: No instability observed          Stability: No instability observed          Skin & Extremity Inspection: Skin color, temperature, and hair growth are WNL. No peripheral edema or cyanosis. No masses, redness, swelling, asymmetry, or associated skin lesions. No contractures.  Skin & Extremity Inspection: Skin color, temperature, and hair growth are WNL. No peripheral edema or cyanosis. No masses, redness, swelling, asymmetry, or associated skin lesions. No contractures.  Functional ROM: Unrestricted ROM                  Functional ROM: Unrestricted ROM                  Muscle Tone/Strength: Functionally intact. No obvious neuro-muscular anomalies detected.  Muscle Tone/Strength: Functionally intact. No obvious neuro-muscular anomalies detected.  Sensory (Neurological): Unimpaired        Sensory (Neurological): Unimpaired        DTR: Patellar: deferred today Achilles: deferred today Plantar: deferred today  DTR: Patellar: deferred today Achilles: deferred today Plantar: deferred today  Palpation: No palpable anomalies  Palpation: No palpable anomalies    Assessment  Primary Diagnosis & Pertinent Problem List: The primary encounter diagnosis was Sacroiliac joint dysfunction of right side. Diagnoses of Piriformis syndrome of right side and Status post lumbar microdiscectomy were also pertinent to this visit.  Visit Diagnosis (New problems to examiner): 1. Sacroiliac joint dysfunction of right side   2. Piriformis syndrome of right side   3. Status post lumbar microdiscectomy    Plan of Care (Initial workup plan)  I discussed SI joint pain and dysfunction with the patient.  We discussed a diagnostic right SI joint injection.  I also recommend that we do a right piriformis trigger point injection at the same time.  Risk and benefits were reviewed with the patient regarding interventions.  Patient would like to proceed.  Procedure Orders         SACROILIAC JOINT  INJECTION         TRIGGER POINT INJECTION     Provider-requested follow-up: Return in about 11 days (around 12/31/2022) for Right SI-J and Right Pirifomris TPI, in clinic [facility].  Future Appointments  Date Time Provider Department Center  12/31/2022 10:20 AM Edward Jolly, MD [facility] None  01/28/2023  9:30 AM [name], PA-C [facility] None    Duration of encounter: .  Total time on encounter, as per AMA guidelines included both the face-to-face and non-face-to-face time personally spent by the physician and/or other qualified health care professional(s) on the day of the encounter (includes time in activities that require the physician or other qualified health care professional and does not include time in activities normally performed by clinical staff). Physician's time may include the following activities when performed: Preparing to see the patient (e.g., pre-charting review of records, searching for previously ordered imaging, lab work, and nerve conduction tests) Review of prior analgesic pharmacotherapies. Reviewing PMP Interpreting  ordered tests (e.g., lab work, imaging, nerve conduction tests) Performing post-procedure evaluations, including interpretation of diagnostic procedures Obtaining and/or reviewing separately obtained history Performing a medically appropriate examination and/or evaluation Counseling and educating the patient/family/caregiver Ordering medications, tests, or procedures Referring and communicating with other health care professionals (when not separately reported) Documenting clinical information in the electronic or other health record Independently interpreting results (not separately reported) and communicating results to the patient/ family/caregiver Care coordination (not separately reported)  Note by: Edward Jolly, MD (TTS technology used. I apologize for any typographical errors that were not detected and corrected.) Date: 12/20/2022; Time:  12:05 PM

## 2022-12-20 NOTE — Patient Instructions (Signed)
Procedure instructions  Do not eat or drink fluids (other than water) for 6 hours before your procedure  No water for 2 hours before your procedure  Take your blood pressure medicine with a sip of water  Arrive 30 minutes before your appointment  Carefully read the "Preparing for your procedure" detailed instructions  If you have questions call us at [phone]  _____________________________________________________________________    ______________________________________________________________________  Preparing for your procedure  Appointments: If you think you may not be able to keep your appointment, call 24-48 hours in advance to cancel. We need time to make it available to others.  During your procedure appointment there will be: No Prescription Refills. No disability issues to discussed. No medication changes or discussions.  Instructions: Food intake: Avoid eating anything solid for at least 8 hours prior to your procedure. Clear liquid intake: You may take clear liquids such as water up to 2 hours prior to your procedure. (No carbonated drinks. No soda.) Transportation: Unless otherwise stated by your physician, bring a driver. Morning Medicines: Except for blood thinners, take all of your other morning medications with a sip of water. Make sure to take your heart and blood pressure medicines. If your blood pressure's lower number is above 100, the case will be rescheduled. Blood thinners: Make sure to stop your blood thinners as instructed.  If you take a blood thinner, but were not instructed to stop it, call our office [phone] and ask to talk to a nurse. Not stopping a blood thinner prior to certain procedures could lead to serious complications. Diabetics on insulin: Notify the staff so that you can be scheduled 1st case in the morning. If your diabetes requires high dose insulin, take only  of your normal insulin dose the morning of the procedure and  notify the staff that you have done so. Preventing infections: Shower with an antibacterial soap the morning of your procedure.  Build-up your immune system: Take 1000 mg of Vitamin C with every meal (3 times a day) the day prior to your procedure. Antibiotics: Inform the nursing staff if you are taking any antibiotics or if you have any conditions that may require antibiotics prior to procedures. (Example: recent joint implants)   Pregnancy: If you are pregnant make sure to notify the nursing staff. Not doing so may result in injury to the fetus, including death.  Sickness: If you have a cold, fever, or any active infections, call and cancel or reschedule your procedure. Receiving steroids while having an infection may result in complications. Arrival: You must be in the facility at least 30 minutes prior to your scheduled procedure. Tardiness: Your scheduled time is also the cutoff time. If you do not arrive at least 15 minutes prior to your procedure, you will be rescheduled.  Children: Do not bring any children with you. Make arrangements to keep them home. Dress appropriately: There is always a possibility that your clothing may get soiled. Avoid long dresses. Valuables: Do not bring any jewelry or valuables.  Reasons to call and reschedule or cancel your procedure: (Following these recommendations will minimize the risk of a serious complication.) Surgeries: Avoid having procedures within 2 weeks of any surgery. (Avoid for 2 weeks before or after any surgery). Flu Shots: Avoid having procedures within 2 weeks of a flu shots or . (Avoid for 2 weeks before or after immunizations). Barium: Avoid having a procedure within 7-10 days after having had a radiological study involving the use of radiological contrast. (  Myelograms, Barium swallow or enema study). Heart attacks: Avoid any elective procedures or surgeries for the initial 6 months after a "Myocardial Infarction" (Heart Attack). Blood  thinners: It is imperative that you stop these medications before procedures. Let us know if you if you take any blood thinner.  Infection: Avoid procedures during or within two weeks of an infection (including chest colds or gastrointestinal problems). Symptoms associated with infections include: Localized redness, fever, chills, night sweats or profuse sweating, burning sensation when voiding, cough, congestion, stuffiness, runny nose, sore throat, diarrhea, nausea, vomiting, cold or Flu symptoms, recent or current infections. It is specially important if the infection is over the area that we intend to treat. Heart and lung problems: Symptoms that may suggest an active cardiopulmonary problem include: cough, chest pain, breathing difficulties or shortness of breath, dizziness, ankle swelling, uncontrolled high or unusually low blood pressure, and/or palpitations. If you are experiencing any of these symptoms, cancel your procedure and contact your primary care physician for an evaluation.  Remember:  Regular Business hours are:  Monday to Thursday 8:00 AM to 4:00 PM  Provider's Schedule: Delano Metz, MD:  Procedure days: Tuesday and Thursday 7:30 AM to 4:00 PM  Edward Jolly, MD:  Procedure days: Monday and Wednesday 7:30 AM to 4:00 PM  Trigger Point Injection Trigger points are areas where you have pain. A trigger point injection is a shot given in the trigger point to help relieve pain for a few days to a few months. Common places for trigger points include the neck, shoulders, upper back, or lower back. A trigger point injection will not cure long-term (chronic) pain permanently. These injections do not always work for every person. For some people, they can help to relieve pain for a few days to a few months. Tell a health care provider about: Any allergies you have. All medicines you are taking, including vitamins, herbs, eye drops, creams, and over-the-counter medicines. Any  problems you or family members have had with anesthetic medicines. Any bleeding problems you have. Any surgeries you have had. Any medical conditions you have. Whether you are pregnant or may be pregnant. What are the risks? Generally, this is a safe procedure. However, problems may occur, including: Infection. Bleeding or bruising. Allergic reaction to the injected medicine. Irritation of the skin around the injection site. What happens before the procedure? Ask your health care provider about: Changing or stopping your regular medicines. This is especially important if you are taking diabetes medicines or blood thinners. Taking medicines such as aspirin and ibuprofen. These medicines can thin your blood. Do not take these medicines unless your health care provider tells you to take them. Taking over-the-counter medicines, vitamins, herbs, and supplements. What happens during the procedure?  Your health care provider will feel for trigger points. A marker may be used to circle the area for the injection. The skin over the trigger point will be washed with a germ-killing soap. You may be given a medicine to help you relax (sedative). A thin needle is used for the injection. You may feel pain or a twitching feeling when the needle enters your skin. A numbing solution may be injected into the trigger point. Sometimes a medicine to keep down inflammation is also injected. Your health care provider may move the needle around the area where the trigger point is located until the tightness and twitching goes away. After the injection, your health care provider may put gentle pressure over the injection site. The injection site  will be covered with a bandage (dressing). The procedure may vary among health care providers and hospitals. What can I expect after treatment? After treatment, you may have soreness and stiffness for 1-2 days. Follow these instructions at home: Injection site  care Remove your dressing in a few hours, or as told by your health care provider. Check your injection site every day for signs of infection. Check for: Redness, swelling, or pain. Fluid or blood. Warmth. Pus or a bad smell. Managing pain, stiffness, and swelling If directed, put ice on the affected area. To do this: Put ice in a plastic bag. Place a towel between your skin and the bag. Leave the ice on for 20 minutes, 2-3 times a day. Remove the ice if your skin turns bright red. This is very important. If you cannot feel pain, heat, or cold, you have a greater risk of damage to the area. Activity If you were given a sedative during the procedure, it can affect you for several hours. Do not drive or operate machinery until your health care provider says that it is safe. Do not take baths, swim, or use a hot tub until your health care provider approves. Return to your normal activities as told by your health care provider. Ask your health care provider what activities are safe for you. General instructions If you were asked to stop your regular medicines, ask your health care provider when you may start taking them again. You may be asked to see an occupational or physical therapist for exercises that reduce muscle strain and stretch the area of the trigger point. Keep all follow-up visits. This is important. Contact a health care provider if: Your pain comes back, and it is worse than before the injection. You may need more injections. You have chills or a fever. The injection site becomes more painful, red, swollen, or warm to the touch. Summary A trigger point injection is a shot given in the trigger point to help relieve pain. Common places for trigger point injections are the neck, shoulders, upper back, and lower back. These injections do not always work for every person, but for some people, the injections can help to relieve pain for a few days to a few months. Contact a health  care provider if symptoms come back or if they are worse than before treatment. Also, get help if the injection site becomes more painful, red, swollen, or warm to the touch. This information is not intended to replace advice given to you by your health care provider. Make sure you discuss any questions you have with your health care provider. Document Revised: 06/21/2020 Document Reviewed: 06/21/2020 [employer]vier Patient Education  2024 [employer].

## 2022-12-28 ENCOUNTER — Other Ambulatory Visit: Payer: Self-pay | Admitting: Family Medicine

## 2022-12-28 DIAGNOSIS — Z1211 Encounter for screening for malignant neoplasm of colon: Secondary | ICD-10-CM

## 2022-12-28 DIAGNOSIS — Z1212 Encounter for screening for malignant neoplasm of rectum: Secondary | ICD-10-CM

## 2022-12-31 ENCOUNTER — Ambulatory Visit
Payer: 59 | Attending: Student in an Organized Health Care Education/Training Program | Admitting: Student in an Organized Health Care Education/Training Program

## 2022-12-31 ENCOUNTER — Encounter: Payer: Self-pay | Admitting: Student in an Organized Health Care Education/Training Program

## 2022-12-31 ENCOUNTER — Ambulatory Visit
Admission: RE | Admit: 2022-12-31 | Discharge: 2022-12-31 | Disposition: A | Discharge status: Home / Self Care | Payer: 59 | Source: Ambulatory Visit | Attending: Student in an Organized Health Care Education/Training Program | Admitting: Student in an Organized Health Care Education/Training Program

## 2022-12-31 VITALS — BP 152/95 | HR 77 | Temp 98.7°F | Resp 14 | Ht 74.0 in | Wt 255.0 lb

## 2022-12-31 DIAGNOSIS — M533 Sacrococcygeal disorders, not elsewhere classified: Secondary | ICD-10-CM

## 2022-12-31 DIAGNOSIS — M545 Low back pain, unspecified: Secondary | ICD-10-CM | POA: Diagnosis not present

## 2022-12-31 DIAGNOSIS — G5701 Lesion of sciatic nerve, right lower limb: Secondary | ICD-10-CM | POA: Diagnosis not present

## 2022-12-31 MED ORDER — LIDOCAINE HCL 2 % IJ SOLN
20.0000 mL | Freq: Once | INTRAMUSCULAR | Status: AC
  Administered 2022-12-31: 200 mg

## 2022-12-31 MED ORDER — ROPIVACAINE HCL 2 MG/ML IJ SOLN
9.0000 mL | Freq: Once | INTRAMUSCULAR | Status: AC
  Administered 2022-12-31: 9 mL via INTRA_ARTICULAR

## 2022-12-31 MED ORDER — IOHEXOL 180 MG/ML  SOLN
10.0000 mL | Freq: Once | INTRAMUSCULAR | Status: AC
  Administered 2022-12-31: 10 mL via INTRA_ARTICULAR
  Filled 2022-12-31: qty 20

## 2022-12-31 MED ORDER — ROPIVACAINE HCL 2 MG/ML IJ SOLN
INTRAMUSCULAR | Status: AC
  Filled 2022-12-31: qty 20

## 2022-12-31 MED ORDER — METHYLPREDNISOLONE ACETATE 80 MG/ML IJ SUSP
80.0000 mg | Freq: Once | INTRAMUSCULAR | Status: AC
  Administered 2022-12-31: 80 mg via INTRA_ARTICULAR

## 2022-12-31 MED ORDER — METHYLPREDNISOLONE ACETATE 80 MG/ML IJ SUSP
INTRAMUSCULAR | Status: AC
  Filled 2022-12-31: qty 1

## 2022-12-31 MED ORDER — LIDOCAINE HCL (PF) 2 % IJ SOLN
INTRAMUSCULAR | Status: AC
  Filled 2022-12-31: qty 10

## 2022-12-31 NOTE — Patient Instructions (Signed)

## 2022-12-31 NOTE — Progress Notes (Signed)
PROVIDER NOTE: Interpretation of information contained herein should be left to medically-trained personnel. Specific patient instructions are provided elsewhere under "Patient Instructions" section of medical record. This document was created in part using STT-dictation technology, any transcriptional errors that may result from this process are unintentional.  Patient: Dean Bishop Type: Established DOB: 1965/08/07 MRN: [id number] PCP: Joaquim Nam, MD  Service: Procedure DOS: 12/31/2022 Setting: Ambulatory Location: Ambulatory outpatient facility Delivery: Face-to-face Provider: Edward Jolly, MD Specialty: Interventional Pain Management Specialty designation: 09 Location: Outpatient facility Ref. Prov.: Joaquim Nam, MD       Interventional Therapy   Procedure: Sacroiliac Joint Steroid Injection #1  and Right Piriformis TPI Laterality: Right     Level: PIIS (Posterior Inferior Iliac Spine)  Imaging: Fluoroscopic guidance Anesthesia: Local anesthesia (1-2% Lidocaine) DOS: 12/31/2022  Performed by: Edward Jolly, MD  Purpose: Diagnostic/Therapeutic Indications: Sacroiliac joint pain in the lower back and hip area severe enough to impact quality of life or function. Rationale (medical necessity): procedure needed and proper for the diagnosis and/or treatment of Mr. Westmoreland's medical symptoms and needs. 1. Sacroiliac joint dysfunction of right side   2. Piriformis syndrome of right side    NAS-11 Pain score:   Pre-procedure: 5 /10   Post-procedure: 1 /10     Target: Interarticular sacroiliac joint. Location: Medial to the postero-medial edge of iliac spine. Region: Lumbosacral-sacrococcygeal. Approach: Inferior postero-medial percutaneous approach. Type of procedure: Percutaneous joint injection.  Position / Prep / Materials:  Position: Prone  Prep solution: DuraPrep (Iodine Povacrylex [0.7% available iodine] and Isopropyl Alcohol, 74% w/w) Prep Area: Entire posterior  lumbosacral area  Materials:  Tray: Block Needle(s):  Type: Spinal  Gauge (G): 22  Length: 3.5-in Qty: 1  H&P (Pre-op Assessment):  Mr. Galen is a 57 y.o. (year old), male patient, seen today for interventional treatment. He  has a past surgical history that includes Shoulder surgery (Right, 2017) and Lumbar laminectomy/decompression microdiscectomy (Right, 11/06/2022). Mr. Roethler has a current medication list which includes the following prescription(s): calcium carbonate, fluoxetine, gabapentin, ibuprofen, lisinopril, meclizine, methocarbamol, and pravastatin. His primarily concern today is the Hip Pain (right)  Initial Vital Signs:  Pulse/HCG Rate: 77ECG Heart Rate: 80 Temp: 98.7 F (37.1 C) Resp: 16 BP: (!) 138/91 SpO2: 97 %  BMI: Estimated body mass index is 32.74 kg/m as calculated from the following:   Height as of this encounter: 6\' 2"  (1.88 m).   Weight as of this encounter: 255 lb (115.7 kg).  Risk Assessment: Allergies: Reviewed. He is allergic to advair diskus [fluticasone-salmeterol] and lipitor [atorvastatin calcium].  Allergy Precautions: None required Coagulopathies: Reviewed. None identified.  Blood-thinner therapy: None at this time Active Infection(s): Reviewed. None identified. Mr. Garfield is afebrile  Site Confirmation: Mr. Palladino was asked to confirm the procedure and laterality before marking the site Procedure checklist: Completed Consent: Before the procedure and under the influence of no sedative(s), amnesic(s), or anxiolytics, the patient was informed of the treatment options, risks and possible complications. To fulfill our ethical and legal obligations, as recommended by the American Medical Association's Code of Ethics, I have informed the patient of my clinical impression; the nature and purpose of the treatment or procedure; the risks, benefits, and possible complications of the intervention; the alternatives, including doing nothing; the risk(s) and  benefit(s) of the alternative treatment(s) or procedure(s); and the risk(s) and benefit(s) of doing nothing. The patient was provided information about the general risks and possible complications associated with the procedure. These may include,  but are not limited to: failure to achieve desired goals, infection, bleeding, organ or nerve damage, allergic reactions, paralysis, and death. In addition, the patient was informed of those risks and complications associated to the procedure, such as failure to decrease pain; infection; bleeding; organ or nerve damage with subsequent damage to sensory, motor, and/or autonomic systems, resulting in permanent pain, numbness, and/or weakness of one or several areas of the body; allergic reactions; (i.e.: anaphylactic reaction); and/or death. Furthermore, the patient was informed of those risks and complications associated with the medications. These include, but are not limited to: allergic reactions (i.e.: anaphylactic or anaphylactoid reaction(s)); adrenal axis suppression; blood sugar elevation that in diabetics may result in ketoacidosis or comma; water retention that in patients with history of congestive heart failure may result in shortness of breath, pulmonary edema, and decompensation with resultant heart failure; weight gain; swelling or edema; medication-induced neural toxicity; particulate matter embolism and blood vessel occlusion with resultant organ, and/or nervous system infarction; and/or aseptic necrosis of one or more joints. Finally, the patient was informed that Medicine is not an exact science; therefore, there is also the possibility of unforeseen or unpredictable risks and/or possible complications that may result in a catastrophic outcome. The patient indicated having understood very clearly. We have given the patient no guarantees and we have made no promises. Enough time was given to the patient to ask questions, all of which were answered to  the patient's satisfaction. Mr. Wessler has indicated that he wanted to continue with the procedure. Attestation: I, the ordering provider, attest that I have discussed with the patient the benefits, risks, side-effects, alternatives, likelihood of achieving goals, and potential problems during recovery for the procedure that I have provided informed consent. Date  Time: 12/31/2022 10:23 AM  Pre-Procedure Preparation:  Monitoring: As per clinic protocol. Respiration, ETCO2, SpO2, BP, heart rate and rhythm monitor placed and checked for adequate function Safety Precautions: Patient was assessed for positional comfort and pressure points before starting the procedure. Time-out: I initiated and conducted the "Time-out" before starting the procedure, as per protocol. The patient was asked to participate by confirming the accuracy of the "Time Out" information. Verification of the correct person, site, and procedure were performed and confirmed by me, the nursing staff, and the patient. "Time-out" conducted as per Joint Commission's Universal Protocol (UP.01.01.01). Time: 1048 Start Time: 1048 hrs.  Description/Narrative of Procedure:          Start Time: 1048 hrs.  Rationale (medical necessity): procedure needed and proper for the diagnosis and/or treatment of the patient's medical symptoms and needs. Procedural Technique Safety Precautions: Aspiration looking for blood return was conducted prior to all injections. At no point did we inject any substances, as a needle was being advanced. No attempts were made at seeking any paresthesias. Safe injection practices and needle disposal techniques used. Medications properly checked for expiration dates. SDV (single dose vial) medications used. Description of the Procedure: Protocol guidelines were followed. The patient was assisted into a comfortable position. The target area was identified and the area prepped in the usual manner. Skin & deeper tissues  infiltrated with local anesthetic. Appropriate amount of time allowed to pass for local anesthetics to take effect. The procedure needles were then advanced to the target area. Proper needle placement secured. Negative aspiration confirmed. Solution injected in intermittent fashion, asking for systemic symptoms every 0.5cc of injectate. The needles were then removed and the area cleansed, making sure to leave some of the prepping solution  back to take advantage of its long term bactericidal properties.    Technical description of procedure:  Fluoroscopy using a posterior anterior 45 degree angle from the midline aiming at the anterolateral aspect of the patient was used to find a direct path into the sacroiliac joint, the superior medial to posterior superior iliac spine.  The skin was marked where the desired target and the skin infiltrated with local anesthetics.  The procedure needle was then advanced until the joint was entered.  Once inside of the joint, we then proceeded to inject the desired solution.  10 cc solution made of 9 cc of 0.2% lidocaine, 1 cc of methylprednisolone, 80 mg/cc.  5 cc injected into the right SI joint after contrast confirmation.  Afterwards a right piriformis trigger point injection was done 1 cm inferior, 1 cm deep, 1 cm lateral to the inferior fissure of the SI joint.  Contrast was injected to confirm piriformis muscle striation.  5 cc of above solution injected into right piriformis.  While injecting, patient did not complain of any pain radiating down his leg.   Vitals:   12/31/22 1044 12/31/22 1047 12/31/22 1052 12/31/22 1055  BP: (!) 153/106 (!) 147/108 (!) 147/101 (!) 152/95  Pulse:      Resp: 15 16 14    Temp:      SpO2: 95% 97% 98%   Weight:      Height:         End Time: 1051 hrs.  Imaging Guidance (Non-Spinal):          Type of Imaging Technique: Fluoroscopy Guidance (Non-Spinal) Indication(s): Assistance in needle guidance and placement for  procedures requiring needle placement in or near specific anatomical locations not easily accessible without such assistance. Exposure Time: Please see nurses notes. Contrast: None used. Fluoroscopic Guidance: I was personally present during the use of fluoroscopy. "Tunnel Vision Technique" used to obtain the best possible view of the target area. Parallax error corrected before commencing the procedure. "Direction-depth-direction" technique used to introduce the needle under continuous pulsed fluoroscopy. Once target was reached, antero-posterior, oblique, and lateral fluoroscopic projection used confirm needle placement in all planes. Images permanently stored in EMR. Interpretation: No contrast injected. I personally interpreted the imaging intraoperatively. Adequate needle placement confirmed in multiple planes. Permanent images saved into the patient's record.  Post-operative Assessment:  Post-procedure Vital Signs:  Pulse/HCG Rate: 7766 Temp: 98.7 F (37.1 C) Resp: 14 BP: (!) 152/95 SpO2: 98 %  EBL: None  Complications: No immediate post-treatment complications observed by team, or reported by patient.  Note: The patient tolerated the entire procedure well. A repeat set of vitals were taken after the procedure and the patient was kept under observation following institutional policy, for this type of procedure. Post-procedural neurological assessment was performed, showing return to baseline, prior to discharge. The patient was provided with post-procedure discharge instructions, including a section on how to identify potential problems. Should any problems arise concerning this procedure, the patient was given instructions to immediately contact us, at any time, without hesitation. In any case, we plan to contact the patient by telephone for a follow-up status report regarding this interventional procedure.  Comments:  No additional relevant information.  Plan of Care (POC)  Orders:   Orders Placed This Encounter  Procedures   DG PAIN CLIN[facility] C-ARM 1-60 MIN NO REPORT    Intraoperative interpretation by procedural physician at [facility].    Standing Status:   Standing    Number of Occurrences:  1    Order Specific Question:   Reason for exam:    Answer:   Assistance in needle guidance and placement for procedures requiring needle placement in or near specific anatomical locations not easily accessible without such assistance.    Medications ordered for procedure: Meds ordered this encounter  Medications   iohexol (OMNIPAQUE) 180 MG/ML injection 10 mL    Must be Myelogram-compatible. If not available, you may substitute with a water-soluble, non-ionic, hypoallergenic, myelogram-compatible radiological contrast medium.   lidocaine (XYLOCAINE) 2 % (with pres) injection 400 mg   methylPREDNISolone acetate (DEPO-MEDROL) injection 80 mg   ropivacaine (PF) 2 mg/mL (0.2%) (NAROPIN) injection 9 mL   Medications administered: We administered iohexol, lidocaine, methylPREDNISolone acetate, and ropivacaine (PF) 2 mg/mL (0.2%).  See the medical record for exact dosing, route, and time of administration.  Follow-up plan:   Return in about 3 weeks (around 01/21/2023) for 3 - 4 weeks PPE, VV .      Recent Visits Date Type Provider Dept  12/20/22 Office Visit Edward Jolly, MD [facility]  Showing recent visits within past 90 days and meeting all other requirements Today's Visits Date Type Provider Dept  12/31/22 Procedure visit Edward Jolly, MD [facility]  Showing today's visits and meeting all other requirements Future Appointments Date Type Provider Dept  02/04/23 Appointment Edward Jolly, MD [facility]  Showing future appointments within next 90 days and meeting all other requirements  Disposition: Discharge home  Discharge (Date  Time): 12/31/2022; 1059 hrs.   Primary Care Physician: Joaquim Nam, MD Location:  [facility] Note by: Edward Jolly, MD (TTS technology used. I apologize for any typographical errors that were not detected and corrected.) Date: 12/31/2022; Time: 11:04 AM  Disclaimer:  Medicine is not an Visual merchandiser. The only guarantee in medicine is that nothing is guaranteed. It is important to note that the decision to proceed with this intervention was based on the information collected from the patient. The Data and conclusions were drawn from the patient's questionnaire, the interview, and the physical examination. Because the information was provided in large part by the patient, it cannot be guaranteed that it has not been purposely or unconsciously manipulated. Every effort has been made to obtain as much relevant data as possible for this evaluation. It is important to note that the conclusions that lead to this procedure are derived in large part from the available data. Always take into account that the treatment will also be dependent on availability of resources and existing treatment guidelines, considered by other Pain Management Practitioners as being common knowledge and practice, at the time of the intervention. For Medico-Legal purposes, it is also important to point out that variation in procedural techniques and pharmacological choices are the acceptable norm. The indications, contraindications, technique, and results of the above procedure should only be interpreted and judged by a Board-Certified Interventional Pain Specialist with extensive familiarity and expertise in the same exact procedure and technique.

## 2023-01-02 ENCOUNTER — Telehealth: Payer: Self-pay

## 2023-01-02 NOTE — Telephone Encounter (Signed)
Post procedure follow up..  Patient states he is doing good 

## 2023-01-10 ENCOUNTER — Other Ambulatory Visit: Payer: Self-pay | Admitting: Family Medicine

## 2023-01-11 NOTE — Telephone Encounter (Signed)
Pt needs CPE in the next 90 days. Please help him get scheduled. Thanks! If there are no CPE slots, a follow-up on Depression and BP will be okay.

## 2023-01-11 NOTE — Telephone Encounter (Signed)
Spoke with patient wife and she will have him give [place] a call to schedule.

## 2023-01-14 ENCOUNTER — Encounter: Payer: Self-pay | Admitting: Gastroenterology

## 2023-01-21 ENCOUNTER — Ambulatory Visit: Payer: 59 | Admitting: Certified Registered Nurse Anesthetist

## 2023-01-21 ENCOUNTER — Encounter: Payer: Self-pay | Admitting: Gastroenterology

## 2023-01-21 ENCOUNTER — Ambulatory Visit
Admission: RE | Admit: 2023-01-21 | Discharge: 2023-01-21 | Disposition: A | Discharge status: Home / Self Care | Payer: 59 | Attending: Gastroenterology | Admitting: Gastroenterology

## 2023-01-21 ENCOUNTER — Encounter
Admission: RE | Disposition: A | Discharge status: Home / Self Care | Payer: Self-pay | Source: Home / Self Care | Attending: Gastroenterology

## 2023-01-21 DIAGNOSIS — Z87891 Personal history of nicotine dependence: Secondary | ICD-10-CM | POA: Insufficient documentation

## 2023-01-21 DIAGNOSIS — I10 Essential (primary) hypertension: Secondary | ICD-10-CM | POA: Diagnosis not present

## 2023-01-21 DIAGNOSIS — E785 Hyperlipidemia, unspecified: Secondary | ICD-10-CM | POA: Insufficient documentation

## 2023-01-21 DIAGNOSIS — Z8249 Family history of ischemic heart disease and other diseases of the circulatory system: Secondary | ICD-10-CM | POA: Insufficient documentation

## 2023-01-21 DIAGNOSIS — D125 Benign neoplasm of sigmoid colon: Secondary | ICD-10-CM | POA: Diagnosis not present

## 2023-01-21 DIAGNOSIS — D123 Benign neoplasm of transverse colon: Secondary | ICD-10-CM | POA: Diagnosis not present

## 2023-01-21 DIAGNOSIS — K635 Polyp of colon: Secondary | ICD-10-CM

## 2023-01-21 DIAGNOSIS — Z1211 Encounter for screening for malignant neoplasm of colon: Secondary | ICD-10-CM | POA: Insufficient documentation

## 2023-01-21 DIAGNOSIS — Z8709 Personal history of other diseases of the respiratory system: Secondary | ICD-10-CM | POA: Insufficient documentation

## 2023-01-21 HISTORY — PX: POLYPECTOMY: SHX5525

## 2023-01-21 HISTORY — PX: COLONOSCOPY WITH PROPOFOL: SHX5780

## 2023-01-21 SURGERY — COLONOSCOPY WITH PROPOFOL
Anesthesia: General

## 2023-01-21 MED ORDER — PROPOFOL 500 MG/50ML IV EMUL
INTRAVENOUS | Status: DC | PRN
  Administered 2023-01-21: 160 ug/kg/min via INTRAVENOUS

## 2023-01-21 MED ORDER — PROPOFOL 10 MG/ML IV BOLUS
INTRAVENOUS | Status: DC | PRN
  Administered 2023-01-21: 80 mg via INTRAVENOUS

## 2023-01-21 MED ORDER — SODIUM CHLORIDE 0.9 % IV SOLN
INTRAVENOUS | Status: DC
  Administered 2023-01-21: 20 mL/h via INTRAVENOUS

## 2023-01-21 NOTE — Anesthesia Preprocedure Evaluation (Signed)
Anesthesia Evaluation  Patient identified by MRN, date of birth, ID band Patient awake    Reviewed: Allergy & Precautions, NPO status , Patient's Chart, lab work & pertinent test results  History of Anesthesia Complications Negative for: history of anesthetic complications  Airway Mallampati: II   Neck ROM: Full    Dental  (+) Teeth Intact, Dental Advidsory Given Bridge :   Pulmonary neg shortness of breath, asthma , neg sleep apnea, neg COPD, neg recent URI, former smoker   Pulmonary exam normal breath sounds clear to auscultation       Cardiovascular Exercise Tolerance: Good hypertension, (-) angina (-) CAD, (-) Past MI, (-) Cardiac Stents and (-) CABG Normal cardiovascular exam(-) dysrhythmias (-) Valvular Problems/Murmurs Rhythm:Regular Rate:Normal  ECG 11/02/22: normal   Neuro/Psych neg Seizures PSYCHIATRIC DISORDERS Anxiety Depression     Neuromuscular disease (brachial neuritis ([place] Turner syndrome) with hx right arm numbness)    GI/Hepatic negative GI ROS, Neg liver ROS,,,  Endo/Other  negative endocrine ROS  Obesity   Renal/GU negative Renal ROS     Musculoskeletal   Abdominal   Peds  Hematology negative hematology ROS (+)   Anesthesia Other Findings Past Medical History: No date: Allergy     Comment:  Vasomotor / Environmental / Chemicals at work No date: Anxiety No date: Asthma     Comment:  Dr. Barnetta Chapel- due to industrial , no longer exposed No date: Depression No date: Hemorrhoids, internal, thrombosed No date: HLD (hyperlipidemia) No date: HNP (herniated nucleus pulposus), lumbar No date: Hyperlipidemia No date: Hypertension No date: Internal thrombosed hemorrhoids No date: Irritability No date: [place]-Turner syndrome No date: Right leg weakness No date: Right lumbar radiculopathy No date: Vertigo   Reproductive/Obstetrics                             Anesthesia  Physical Anesthesia Plan  ASA: 2  Anesthesia Plan: General   Post-op Pain Management:    Induction: Intravenous  PONV Risk Score and Plan: 2 and Treatment may vary due to age or medical condition, Propofol infusion and TIVA  Airway Management Planned: Natural Airway and Nasal Cannula  Additional Equipment:   Intra-op Plan:   Post-operative Plan:   Informed Consent: I have reviewed the patients History and Physical, chart, labs and discussed the procedure including the risks, benefits and alternatives for the proposed anesthesia with the patient or authorized representative who has indicated his/her understanding and acceptance.     Dental advisory given  Plan Discussed with: CRNA  Anesthesia Plan Comments: (Patient consented for risks of anesthesia including but not limited to:  - adverse reactions to medications - damage to eyes, teeth, lips or other oral mucosa - nerve damage due to positioning  - sore throat or hoarseness - damage to heart, brain, nerves, lungs, other parts of body or loss of life  Informed patient about role of CRNA in peri- and intra-operative care.  Patient voiced understanding.)        Anesthesia Quick Evaluation

## 2023-01-21 NOTE — Transfer of Care (Signed)
Immediate Anesthesia Transfer of Care Note  Patient: Dean Bishop  Procedure(s) Performed: COLONOSCOPY WITH PROPOFOL POLYPECTOMY HOT HEMOSTASIS (ARGON PLASMA COAGULATION/BICAP)  Patient Location: PACU  Anesthesia Type:General  Level of Consciousness: awake, alert , and oriented  Airway & Oxygen Therapy: Patient Spontanous Breathing  Post-op Assessment: Report given to RN and Post -op Vital signs reviewed and stable  Post vital signs: Reviewed and stable  Last Vitals:  Vitals Value Taken Time  BP    Temp    Pulse    Resp 22 01/21/23 0943  SpO2    Vitals shown include unfiled device data.  Last Pain:  Vitals:   01/21/23 0845  TempSrc: Temporal  PainSc: 4          Complications: No notable events documented.

## 2023-01-21 NOTE — Op Note (Signed)
[facility] Patient Name: Dean Bishop Procedure Date: 01/21/2023 9:18 AM MRN: [id number] Account #: [id number] Date of Birth: 1965-04-24 Admit Type: Outpatient Age: 57 Room: [facility] ENDO ROOM 2 Gender: Male Note Status: Finalized Instrument Name: Prentice Docker [id number] Procedure:             Colonoscopy Indications:           Screening for colorectal malignant neoplasm, This is                         the patient's first colonoscopy Providers:             Toney Reil MD, MD Referring MD:          Dwana Curd. Para March, MD (Referring MD) Medicines:             General Anesthesia Complications:         No immediate complications. Estimated blood loss: None. Procedure:             Pre-Anesthesia Assessment:                        - Prior to the procedure, a History and Physical was                         performed, and patient medications and allergies were                         reviewed. The patient is competent. The risks and                         benefits of the procedure and the sedation options and                         risks were discussed with the patient. All questions                         were answered and informed consent was obtained.                         Patient identification and proposed procedure were                         verified by the physician, the nurse, the                         anesthesiologist, the anesthetist and the technician                         in the pre-procedure area in the procedure room in the                         endoscopy suite. Mental Status Examination: alert and                         oriented. Airway Examination: normal oropharyngeal                         airway and neck mobility. Respiratory Examination:  clear to auscultation. CV Examination: normal.                         Prophylactic Antibiotics: The patient does not require                         prophylactic  antibiotics. Prior Anticoagulants: The                         patient has taken no anticoagulant or antiplatelet                         agents. ASA Grade Assessment: II - A patient with mild                         systemic disease. After reviewing the risks and                         benefits, the patient was deemed in satisfactory                         condition to undergo the procedure. The anesthesia                         plan was to use general anesthesia. Immediately prior                         to administration of medications, the patient was                         re-assessed for adequacy to receive sedatives. The                         heart rate, respiratory rate, oxygen saturations,                         blood pressure, adequacy of pulmonary ventilation, and                         response to care were monitored throughout the                         procedure. The physical status of the patient was                         re-assessed after the procedure.                        After obtaining informed consent, the colonoscope was                         passed under direct vision. Throughout the procedure,                         the patient's blood pressure, pulse, and oxygen                         saturations were monitored continuously. The  Colonoscope was introduced through the anus and                         advanced to the the cecum, identified by appendiceal                         orifice and ileocecal valve. The colonoscopy was                         performed without difficulty. The patient tolerated                         the procedure well. The quality of the bowel                         preparation was evaluated using the BBPS [facility] Bowel                         Preparation Scale) with scores of: Right Colon = 3,                         Transverse Colon = 3 and Left Colon = 3 (entire mucosa                         seen  well with no residual staining, small fragments                         of stool or opaque liquid). The total BBPS score                         equals 9. The ileocecal valve, appendiceal orifice,                         and rectum were photographed. Findings:      The perianal and digital rectal examinations were normal. Pertinent       negatives include normal sphincter tone and no palpable rectal lesions.      A 5 mm polyp was found in the transverse colon. The polyp was sessile.       The polyp was removed with a cold snare. Resection and retrieval were       complete. Estimated blood loss: none.      A 15 mm polyp was found in the sigmoid colon. The polyp was       pedunculated. The polyp was removed with a hot snare. Resection and       retrieval were complete.      The retroflexed view of the distal rectum and anal verge was normal and       showed no anal or rectal abnormalities. Impression:            - One 5 mm polyp in the transverse colon, removed with                         a cold snare. Resected and retrieved.                        - One 15 mm polyp in the sigmoid colon, removed with a  hot snare. Resected and retrieved.                        - The distal rectum and anal verge are normal on                         retroflexion view. Recommendation:        - Discharge patient to home (with escort).                        - Resume previous diet today.                        - Continue present medications.                        - Await pathology results.                        - Repeat colonoscopy in 3 years for surveillance. Procedure Code(s):     --- Professional ---                        [phone], Colonoscopy, flexible; with removal of                         tumor(s), polyp(s), or other lesion(s) by snare                         technique Diagnosis Code(s):     --- Professional ---                        Z12.11, Encounter for screening for  malignant neoplasm                         of colon                        D12.3, Benign neoplasm of transverse colon (hepatic                         flexure or splenic flexure)                        D12.5, Benign neoplasm of sigmoid colon CPT copyright 2022 American Medical Association. All rights reserved. The codes documented in this report are preliminary and upon coder review may  be revised to meet current compliance requirements. Dr. Libby Maw Toney Reil MD, MD 01/21/2023 9:40:06 AM This report has been signed electronically. Number of Addenda: 0 Note Initiated On: 01/21/2023 9:18 AM Scope Withdrawal Time: 0 hours 8 minutes 45 seconds  Total Procedure Duration: 0 hours 10 minutes 10 seconds  Estimated Blood Loss:  Estimated blood loss: none.      [facility]

## 2023-01-21 NOTE — H&P (Signed)
Arlyss Repress, MD [address]  [address]  [place], [place] [address]  Main: [phone]  Fax: [phone] Pager: [phone]  Primary Care Physician:  Joaquim Nam, MD Primary Gastroenterologist:  Dr. Arlyss Repress  Pre-Procedure History & Physical: HPI:  Dean Bishop is a 57 y.o. male is here for an colonoscopy.   Past Medical History:  Diagnosis Date   Allergy    Vasomotor / Environmental / Chemicals at work   Anxiety    Asthma    Dr. Barnetta Chapel- due to industrial , no longer exposed   Depression    Hemorrhoids, internal, thrombosed    HLD (hyperlipidemia)    HNP (herniated nucleus pulposus), lumbar    Hyperlipidemia    Hypertension    Internal thrombosed hemorrhoids    Irritability    [place]-Turner syndrome    Right leg weakness    Right lumbar radiculopathy    Vertigo     Past Surgical History:  Procedure Laterality Date   LUMBAR LAMINECTOMY/DECOMPRESSION MICRODISCECTOMY Right 11/06/2022   Procedure: RIGHT L4-5 MICRODISCECTOMY;  Surgeon: Loreen Freud, MD;  Location: [facility] ORS;  Service: Neurosurgery;  Laterality: Right;   SHOULDER SURGERY Right 2017    Prior to Admission medications   Medication Sig Start Date End Date Taking? Authorizing Provider  calcium carbonate (TUMS EX) 750 MG chewable tablet Chew 1 tablet by mouth as needed for heartburn. PRN   Yes [provider]  FLUoxetine (PROZAC) 10 MG capsule TAKE 1 CAPSULE BY MOUTH DAILY 01/11/23  Yes Joaquim Nam, MD  gabapentin (NEURONTIN) 100 MG capsule Take 2 capsules (200 mg total) by mouth 3 (three) times daily. 11/16/22  Yes [name], PA-C  ibuprofen (ADVIL) 800 MG tablet Take 800 mg by mouth every 8 (eight) hours as needed.   Yes [provider]  lisinopril (ZESTRIL) 10 MG tablet TAKE 1 TABLET BY MOUTH ONCE  DAILY 01/11/23  Yes Joaquim Nam, MD  meclizine (ANTIVERT) 12.5 MG tablet TAKE 1 TO 2 TABLETS(12.5 TO 25 MG) BY MOUTH THREE TIMES DAILY AS NEEDED FOR  DIZZINESS 01/12/22  Yes Joaquim Nam, MD  methocarbamol (ROBAXIN) 500 MG tablet Take 1 tablet (500 mg total) by mouth 4 (four) times daily. 11/06/22  Yes Susanne Borders, PA  pravastatin (PRAVACHOL) 80 MG tablet TAKE 1 TABLET BY MOUTH DAILY 11/05/22  Yes Joaquim Nam, MD    Allergies as of 12/04/2022 - Review Complete 12/04/2022  Allergen Reaction Noted   Advair diskus [fluticasone-salmeterol]  05/04/2013   Lipitor [atorvastatin calcium] Other (See Comments) 10/15/2017    Family History  Problem Relation Age of Onset   Alcohol abuse Father    Diabetes Father    Heart disease Father    Hypertension Mother    Hypertension Sister    Hypertension Brother    Heart disease Paternal Grandfather        CABG x 2   Cancer Neg Hx    Stroke Neg Hx    Colon cancer Neg Hx    Prostate cancer Neg Hx     Social History   Socioeconomic History   Marital status: Married    Spouse name: Cristy   Number of children: 2   Years of education: Not on file   Highest education level: Bachelor's degree (e.g., BA, AB, BS)  Occupational History   Occupation: Geologist, engineering: [employer]    Comment: Tool  Tobacco Use   Smoking status: Former  Current packs/day: 0.00    Types: Cigarettes    Quit date: 01/25/2001    Years since quitting: 22.0   Smokeless tobacco: Former    Quit date: 03/26/2000   Tobacco comments:    quit over 10 years ago  Vaping Use   Vaping status: Never Used  Substance and Sexual Activity   Alcohol use: Yes    Comment: rarely   Drug use: No   Sexual activity: Not on file  Other Topics Concern   Not on file  Social History Narrative   Acupuncturist, [employer] grad, Married 1993.   Social Determinants of Health   Financial Resource Strain: Low Risk  (10/07/2022)   Overall Financial Resource Strain (CARDIA)    Difficulty of Paying Living Expenses: Not very hard  Food Insecurity: No Food Insecurity (10/07/2022)   Hunger Vital Sign    Worried  About Running Out of Food in the Last Year: Never true    Ran Out of Food in the Last Year: Never true  Transportation Needs: No Transportation Needs (10/07/2022)   PRAPARE - Administrator, Civil Service (Medical): No    Lack of Transportation (Non-Medical): No  Physical Activity: Insufficiently Active (10/07/2022)   Exercise Vital Sign    Days of Exercise per Week: 2 days    Minutes of Exercise per Session: 10 min  Stress: No Stress Concern Present (10/07/2022)   [employer] of Occupational Health - Occupational Stress Questionnaire    Feeling of Stress : Not at all  Social Connections: Moderately Integrated (10/07/2022)   Social Connection and Isolation Panel [NHANES]    Frequency of Communication with Friends and Family: More than three times a week    Frequency of Social Gatherings with Friends and Family: Once a week    Attends Religious Services: More than 4 times per year    Active Member of [employer] or Organizations: No    Attends Engineer, structural: Not on file    Marital Status: Married  Catering manager Violence: Not on file    Review of Systems: See HPI, otherwise negative ROS  Physical Exam: BP (!) 143/101   Pulse 70   Temp (!) 96.9 F (36.1 C) (Temporal)   Resp 20   Ht 6\' 2"  (1.88 m)   Wt 111.1 kg   SpO2 97%   BMI 31.46 kg/m  General:   Alert,  pleasant and cooperative in NAD Head:  Normocephalic and atraumatic. Neck:  Supple; no masses or thyromegaly. Lungs:  Clear throughout to auscultation.    Heart:  Regular rate and rhythm. Abdomen:  Soft, nontender and nondistended. Normal bowel sounds, without guarding, and without rebound.   Neurologic:  Alert and  oriented x4;  grossly normal neurologically.  Impression/Plan: Dean Bishop is here for an colonoscopy to be performed for colon cancer screening  Risks, benefits, limitations, and alternatives regarding  colonoscopy have been reviewed with the patient.  Questions have been  answered.  All parties agreeable.   Lannette Donath, MD  01/21/2023, 9:14 AM

## 2023-01-21 NOTE — Anesthesia Procedure Notes (Signed)
Date/Time: 01/21/2023 9:25 AM  Performed by: Malva Cogan, CRNAPre-anesthesia Checklist: Patient identified, Emergency Drugs available, Suction available, Patient being monitored and Timeout performed Patient Re-evaluated:Patient Re-evaluated prior to induction Oxygen Delivery Method: Nasal cannula Induction Type: IV induction Placement Confirmation: CO2 detector and positive ETCO2

## 2023-01-22 ENCOUNTER — Encounter: Payer: Self-pay | Admitting: Gastroenterology

## 2023-01-23 LAB — SURGICAL PATHOLOGY

## 2023-01-25 ENCOUNTER — Encounter: Payer: Self-pay | Admitting: Gastroenterology

## 2023-01-25 NOTE — Progress Notes (Unsigned)
REFERRING PHYSICIAN:  Joaquim Nam, Md [address] [place],  [place] [address]  DOS: 11/06/22  right sided L4-L5 microdiscectomy   HISTORY OF PRESENT ILLNESS:  Was referred to Dr. Cherylann Ratel at his last visit for SI joint pain. He had right SI joint and right piriformis TPI by Dr. Cherylann Ratel on 12/31/22.   These injections helped with his right hip pain- he is not having any of this pain. He still notes some popping in his right lower back/hip with walking. He notes frequent popping of his right ankle with doing hills/stairs. He has mild pain with this.    No change in his foot- he has numbness and tingling on right. Toes still not moving much.   He has constant posterior pain between his shoulder blades along with constant right elbow pain with radiation of pain into his small finger x 2-3 weeks. Pain is worse with looking down. He has some numbness/tingling in his small and ring finger. He has some weakness in right hand. No known injury.   He was off neurontin and restarted it yesterday. He has motrin and robaxin to use prn.   PHYSICAL EXAMINATION:  General: Patient is well developed, well nourished, calm, collected, and in no apparent distress.   NEUROLOGICAL:  General: In no acute distress.   Awake, alert, oriented to person, place, and time.  Pupils equal round and reactive to light.  Facial tone is symmetric.    No posterior cervical tenderness. No tenderness in bilateral trapezial region.   No tenderness about the right elbow. Good ROM with no pain.   Well healed lumbar incision. Mild tenderness over right SI joint.   Strength: Side Biceps Triceps Deltoid Interossei Grip Wrist Ext. Wrist Flex.  R 5 5 5 5 5 5 5   L 5 5 5 5 5 5 5    Side Iliopsoas Quads Hamstring PF DF EHL  R 5 5 5 5 4 2   L 5 5 5 5 5 5    Reflexes are 2+ and symmetric at the biceps, brachioradialis.   Hoffman's is absent.    Bilateral upper and lower extremity sensation is intact to light touch.      Gait is normal.   ROS (Neurologic):  Negative except as noted above  IMAGING: Nothing new to review.   ASSESSMENT/PLAN:  Dean Bishop is doing reasonable s/p above surgery. His right SI joint pain is better after injections, he still has some intermittent popping. He continues with numbness/tingling/weakness in right foot.   Now with 2-3 weeks of constant posterior pain between his shoulder blades along with constant right elbow pain with radiation of pain into his small finger. Has shooting pain in right arm when he looks down. He has some numbness/tingling in his small and ring finger. He has some weakness in right hand.   No cervical imaging. Good strength in right arm. Pain appears more cervical mediated. Does not appear elbow mediated.   Treatment options reviewed with patient and following plan made:   - Medrol dose pack for symptom relief. Reviewed dosing and side effects. Note made of allergy to inhaled steroids. Has done dose pack in the past multiple times with no issues.  - If no improvement with dose pack, will restart neurontin and likely get cervical MRI.   - Will continue to watch right foot numbness/weakness for now. Overall, he is doing much better than he was prior to surgery.   - Will message him in a week  to check on his progress. Will schedule f/u for lumbar spine at that point as well (likely see Dr. Katrinka Blazing in 3 months since he still having weakness in right foot).   BP was initially elevated, but then came back done on recheck. No symptoms of chest pain, shortness of breath, blurry vision, or headaches. He has not taken his morning medications. Will take when he gets home.     I spent a total of 25 minutes in face-to-face and non-face-to-face activities related to this patient's care today including review of outside records, review of imaging, review of symptoms, physical exam, discussion of differential diagnosis, discussion of treatment options, and  documentation.   [name] PA-C Department of neurosurgery

## 2023-01-28 ENCOUNTER — Encounter: Payer: Self-pay | Admitting: Orthopedic Surgery

## 2023-01-28 ENCOUNTER — Ambulatory Visit (INDEPENDENT_AMBULATORY_CARE_PROVIDER_SITE_OTHER): Payer: 59 | Admitting: Orthopedic Surgery

## 2023-01-28 VITALS — BP 138/82 | Ht 74.0 in | Wt 245.0 lb

## 2023-01-28 DIAGNOSIS — Z9889 Other specified postprocedural states: Secondary | ICD-10-CM

## 2023-01-28 DIAGNOSIS — M5416 Radiculopathy, lumbar region: Secondary | ICD-10-CM

## 2023-01-28 DIAGNOSIS — M461 Sacroiliitis, not elsewhere classified: Secondary | ICD-10-CM

## 2023-01-28 DIAGNOSIS — M542 Cervicalgia: Secondary | ICD-10-CM

## 2023-01-28 DIAGNOSIS — M5412 Radiculopathy, cervical region: Secondary | ICD-10-CM

## 2023-01-28 MED ORDER — METHYLPREDNISOLONE 4 MG PO TBPK: ORAL_TABLET | ORAL | 0 refills | Status: DC

## 2023-01-28 NOTE — Anesthesia Postprocedure Evaluation (Signed)
Anesthesia Post Note  Patient: Dean Bishop  Procedure(s) Performed: COLONOSCOPY WITH PROPOFOL POLYPECTOMY  Patient location during evaluation: Endoscopy Anesthesia Type: General Level of consciousness: awake and alert Pain management: pain level controlled Vital Signs Assessment: post-procedure vital signs reviewed and stable Respiratory status: spontaneous breathing, nonlabored ventilation, respiratory function stable and patient connected to nasal cannula oxygen Cardiovascular status: blood pressure returned to baseline and stable Postop Assessment: no apparent nausea or vomiting Anesthetic complications: no   No notable events documented.   Last Vitals:  Vitals:   01/21/23 0942 01/21/23 0952  BP: 130/85   Pulse: 80   Resp:    Temp: (!) 36.1 C   SpO2: 96% 98%    Last Pain:  Vitals:   01/21/23 1002  TempSrc:   PainSc: 0-No pain                 Lenard Simmer

## 2023-01-31 ENCOUNTER — Encounter: Payer: Self-pay | Admitting: Ophthalmology

## 2023-01-31 DIAGNOSIS — M542 Cervicalgia: Secondary | ICD-10-CM

## 2023-01-31 DIAGNOSIS — M5412 Radiculopathy, cervical region: Secondary | ICD-10-CM

## 2023-01-31 NOTE — Addendum Note (Signed)
Addended by: Ernie Hew on: 01/31/2023 04:43 PM   Modules accepted: Orders

## 2023-01-31 NOTE — Telephone Encounter (Signed)
I can order the cervical MRI external, but I don't know how we will get images/report???? Let me know what I need to do.

## 2023-02-01 ENCOUNTER — Telehealth: Payer: Self-pay

## 2023-02-01 ENCOUNTER — Encounter: Payer: Self-pay | Admitting: Student in an Organized Health Care Education/Training Program

## 2023-02-01 NOTE — Telephone Encounter (Signed)
Completed pre-appointment nurse questions for evaluation post-procedure.

## 2023-02-03 NOTE — Anesthesia Preprocedure Evaluation (Addendum)
Anesthesia Evaluation  Patient identified by MRN, date of birth, ID band Patient awake    Reviewed: Allergy & Precautions, H&P , NPO status , Patient's Chart, lab work & pertinent test results  Airway Mallampati: II  TM Distance: >3 FB Neck ROM: Full    Dental no notable dental hx.    Pulmonary neg pulmonary ROS, asthma , former smoker   Pulmonary exam normal breath sounds clear to auscultation       Cardiovascular hypertension, negative cardio ROS Normal cardiovascular exam Rhythm:Regular Rate:Normal     Neuro/Psych  PSYCHIATRIC DISORDERS Anxiety Depression     Neuromuscular disease negative neurological ROS  negative psych ROS   GI/Hepatic negative GI ROS, Neg liver ROS,,,  Endo/Other  negative endocrine ROS    Renal/GU negative Renal ROS  negative genitourinary   Musculoskeletal negative musculoskeletal ROS (+)    Abdominal   Peds negative pediatric ROS (+)  Hematology negative hematology ROS (+)   Anesthesia Other Findings Asthma  Anxiety Depression  Internal thrombosed hemorrhoids Hyperlipidemia  Irritability Allergy  Hypertension [place]-Turner syndrome--brachial plexitis--without apparent injury, pain, numbness,weakness  Right lumbar radiculopathy Hemorrhoids, internal, thrombosed  HNP (herniated nucleus pulposus), lumbar Right leg weakness  Vertigo HLD (hyperlipidemia)  Nerve pain Presence of dental bridge     Reproductive/Obstetrics negative OB ROS                              Anesthesia Physical Anesthesia Plan  ASA: 2  Anesthesia Plan: MAC   Post-op Pain Management:    Induction: Intravenous  PONV Risk Score and Plan:   Airway Management Planned: Natural Airway and Nasal Cannula  Additional Equipment:   Intra-op Plan:   Post-operative Plan:   Informed Consent: I have reviewed the patients History and Physical, chart, labs and discussed the  procedure including the risks, benefits and alternatives for the proposed anesthesia with the patient or authorized representative who has indicated his/her understanding and acceptance.     Dental Advisory Given  Plan Discussed with: Anesthesiologist, CRNA and Surgeon  Anesthesia Plan Comments: (Patient consented for risks of anesthesia including but not limited to:  - adverse reactions to medications - damage to eyes, teeth, lips or other oral mucosa - nerve damage due to positioning  - sore throat or hoarseness - Damage to heart, brain, nerves, lungs, other parts of body or loss of life  Patient voiced understanding and assent.)         Anesthesia Quick Evaluation

## 2023-02-04 ENCOUNTER — Ambulatory Visit
Payer: 59 | Attending: Student in an Organized Health Care Education/Training Program | Admitting: Student in an Organized Health Care Education/Training Program

## 2023-02-04 DIAGNOSIS — M533 Sacrococcygeal disorders, not elsewhere classified: Secondary | ICD-10-CM | POA: Diagnosis not present

## 2023-02-04 DIAGNOSIS — G5701 Lesion of sciatic nerve, right lower limb: Secondary | ICD-10-CM

## 2023-02-04 DIAGNOSIS — Z9889 Other specified postprocedural states: Secondary | ICD-10-CM | POA: Diagnosis not present

## 2023-02-04 NOTE — Progress Notes (Signed)
Patient: Dean Bishop  Service Category: E/M  Provider: Edward Jolly, MD  DOB: 1965-06-28  DOS: 02/04/2023  Location: Office  MRN: [id number]  Setting: Ambulatory outpatient  Referring Provider: Joaquim Nam, MD  Type: Established Patient  Specialty: Interventional Pain Management  PCP: Joaquim Nam, MD  Location: Remote location  Delivery: TeleHealth     Virtual Encounter - Pain Management PROVIDER NOTE: Information contained herein reflects review and annotations entered in association with encounter. Interpretation of such information and data should be left to medically-trained personnel. Information provided to patient can be located elsewhere in the medical record under "Patient Instructions". Document created using STT-dictation technology, any transcriptional errors that may result from process are unintentional.    Contact & Pharmacy Preferred: [phone] Home: [phone] (home) Mobile: [phone] (mobile) E-mail: [email or web address]  [facility]) - [place], [place] - [address] [place] [address] [place] [address] Phone: [phone] Fax: [phone]  [facility] - [place], [place] - [address] [place]  [address] Phone: [phone] Fax: [phone]  [id number] - [place], [place] - [address] AT [facility] [address] [address] [place] [address] Phone: [phone] Fax: [phone]   Pre-screening  Mr. Falzon offered "in-person" vs "virtual" encounter. He indicated preferring virtual for this encounter.   Reason COVID-19*  Social distancing based on CDC and AMA recommendations.   I contacted [name] on 02/04/2023 via telephone.      I clearly identified myself as Edward Jolly, MD. I verified that I was speaking with the correct person using two identifiers (Name: BENNEY YONEDA, and date of birth:  07-23-1965).  Consent I sought verbal advanced consent from [name] for virtual visit interactions. I informed Mr. Lampley of possible security and privacy concerns, risks, and limitations associated with providing "not-in-person" medical evaluation and management services. I also informed Mr. Weakland of the availability of "in-person" appointments. Finally, I informed him that there would be a charge for the virtual visit and that he could be  personally, fully or partially, financially responsible for it. Mr. Markunas expressed understanding and agreed to proceed.   Historic Elements   Mr. DEIDRICK FORTES is a 57 y.o. year old, male patient evaluated today after our last contact on 12/31/2022. Mr. Mccallie  has a past medical history of Allergy, Anxiety, Asthma, Depression, Hemorrhoids, internal, thrombosed, HLD (hyperlipidemia), HNP (herniated nucleus pulposus), lumbar, Hyperlipidemia, Hypertension, Internal thrombosed hemorrhoids, Irritability, Nerve pain, Parsonage-Turner syndrome, Presence of dental bridge, Right leg weakness, Right lumbar radiculopathy, and Vertigo. He also  has a past surgical history that includes Shoulder surgery (Right, 2017); Lumbar laminectomy/decompression microdiscectomy (Right, 11/06/2022); Colonoscopy with propofol (N/A, 01/21/2023); and polypectomy (01/21/2023). Mr. Peregrino has a current medication list which includes the following prescription(s): albuterol, beclomethasone, fluoxetine, lisinopril, meclizine, pravastatin, calcium carbonate, gabapentin, ibuprofen, methocarbamol, and methylprednisolone. He  reports that he quit smoking about 2 years ago. His smoking use included cigarettes. He started smoking about 35 years ago. He has a 7.4 pack-year smoking history. He quit smokeless tobacco use about 22 years ago. He reports current alcohol use. He reports that he does not use drugs. Mr. Keirsey is allergic to advair diskus [fluticasone-salmeterol] and lipitor [atorvastatin calcium].   BMI: Estimated body mass index is 32.1 kg/m as calculated from the following:   Height as of 01/31/23: 6\' 2"  (1.88 m).  Weight as of 01/31/23: 250 lb (113.4 kg). Last encounter: 12/20/2022. Last procedure: 12/31/2022.  HPI  Today, he is being contacted for a post-procedure assessment.   Post-procedure evaluation   Procedure: Sacroiliac Joint Steroid Injection #1  and Right Piriformis TPI Laterality: Right     Level: PIIS (Posterior Inferior Iliac Spine)  Imaging: Fluoroscopic guidance Anesthesia: Local anesthesia (1-2% Lidocaine) DOS: 12/31/2022  Performed by: Edward Jolly, MD  Purpose: Diagnostic/Therapeutic Indications: Sacroiliac joint pain in the lower back and hip area severe enough to impact quality of life or function. Rationale (medical necessity): procedure needed and proper for the diagnosis and/or treatment of Mr. Mones's medical symptoms and needs. 1. Sacroiliac joint dysfunction of right side   2. Piriformis syndrome of right side    NAS-11 Pain score:   Pre-procedure: 5 /10   Post-procedure: 1 /10      Effectiveness:  Initial hour after procedure: 100 %  Subsequent 4-6 hours post-procedure: 100 %  Analgesia past initial 6 hours: 100 %  Ongoing improvement:  Analgesic:  100% Function: Mr. Blatchford reports improvement in function ROM: Mr. Jansky reports improvement in ROM   Laboratory Chemistry Profile   Renal Lab Results  Component Value Date   BUN 24 (H) 11/02/2022   CREATININE 0.89 11/02/2022   GFR 78.22 05/28/2022   GFRNONAA >60 11/02/2022    Hepatic Lab Results  Component Value Date   AST 16 05/28/2022   ALT 20 05/28/2022   ALBUMIN 4.2 05/28/2022   ALKPHOS 47 05/28/2022    Electrolytes Lab Results  Component Value Date   NA 136 11/02/2022   K 4.3 11/02/2022   CL 103 11/02/2022   CALCIUM 9.5 11/02/2022   PHOS 3.6 01/18/2011    Bone No results found for: "VD25OH", "VD125OH2TOT", "ZO1096EA5", "WU9811BJ4", "25OHVITD1", "25OHVITD2",  "25OHVITD3", "TESTOFREE", "TESTOSTERONE"  Inflammation (CRP: Acute Phase) (ESR: Chronic Phase) Lab Results  Component Value Date   ESRSEDRATE 5 02/14/2011         Note: Above Lab results reviewed.   Assessment  The primary encounter diagnosis was Sacroiliac joint dysfunction of right side. Diagnoses of Piriformis syndrome of right side and Status post lumbar microdiscectomy were also pertinent to this visit.  Plan of Care  Patient states that he is doing very well in regards to his right hip and right buttock pain after his right SI joint and right piriformis injection.  He is complaining of pain along his shoulder blades that radiates down to his right elbow and into his small finger.  He also has some numbness and tingling of his ring finger.  Subjective weakness of the right hand.  He has a cervical MRI scheduled.  I informed him to let me know of this the results when they are in and if he is interested in trying a cervical injection based upon the results, I will be happy to see him back for that.  He is still having some numbness and weakness of his right foot which he hopes to follow-up with Dr. Katrinka Blazing likely after the new year.  Follow-up plan:   Return for patient will call to schedule F2F appt prn.      Recent Visits Date Type Provider Dept  12/31/22 Procedure visit Edward Jolly, MD [facility]  12/20/22 Office Visit Edward Jolly, MD [facility]  Showing recent visits within past 90 days and meeting all other requirements Today's Visits Date Type Provider Dept  02/04/23 Office Visit Edward Jolly, MD [facility]  Showing  today's visits and meeting all other requirements Future Appointments No visits were found meeting these conditions. Showing future appointments within next 90 days and meeting all other requirements  I discussed the assessment and treatment plan with the patient. The patient was provided an opportunity to ask questions  and all were answered. The patient agreed with the plan and demonstrated an understanding of the instructions.  Patient advised to call back or seek an in-person evaluation if the symptoms or condition worsens.  Duration of encounter: .  Note by: Edward Jolly, MD Date: 02/04/2023; Time: 3:15 PM

## 2023-02-05 ENCOUNTER — Other Ambulatory Visit: Payer: 59

## 2023-02-05 NOTE — Telephone Encounter (Signed)
Dean Bishop,   His MRI is scheduled for Wednesday at 9:30. He is having it at:   [facility] to determine if they can schedule me and will let you know what they say. [address] [place], [place] [phone] [email or web address]  Not sure if we need to do anything to get report/images.   Thanks!

## 2023-02-06 NOTE — Telephone Encounter (Signed)
They will mail a CD

## 2023-02-06 NOTE — Telephone Encounter (Signed)
Report has been scanned into his chart.

## 2023-02-07 NOTE — Telephone Encounter (Signed)
Left patient voicemail and sent a mychart message about scheduling an appointment

## 2023-02-07 NOTE — Discharge Instructions (Signed)

## 2023-02-08 ENCOUNTER — Ambulatory Visit: Payer: 59 | Admitting: Anesthesiology

## 2023-02-08 ENCOUNTER — Ambulatory Visit
Admission: RE | Admit: 2023-02-08 | Discharge: 2023-02-08 | Disposition: A | Discharge status: Home / Self Care | Payer: 59 | Attending: Ophthalmology | Admitting: Ophthalmology

## 2023-02-08 ENCOUNTER — Encounter
Admission: RE | Disposition: A | Discharge status: Home / Self Care | Payer: Self-pay | Source: Home / Self Care | Attending: Ophthalmology

## 2023-02-08 ENCOUNTER — Encounter: Payer: Self-pay | Admitting: Ophthalmology

## 2023-02-08 ENCOUNTER — Other Ambulatory Visit: Payer: Self-pay

## 2023-02-08 DIAGNOSIS — Z538 Procedure and treatment not carried out for other reasons: Secondary | ICD-10-CM | POA: Diagnosis not present

## 2023-02-08 DIAGNOSIS — H02831 Dermatochalasis of right upper eyelid: Secondary | ICD-10-CM | POA: Diagnosis present

## 2023-02-08 HISTORY — DX: Presence of dental prosthetic device (complete) (partial): Z97.2

## 2023-02-08 HISTORY — DX: Neuralgia and neuritis, unspecified: M79.2

## 2023-02-08 SURGERY — BLEPHAROPLASTY
Anesthesia: Monitor Anesthesia Care

## 2023-02-08 MED ORDER — FENTANYL CITRATE (PF) 100 MCG/2ML IJ SOLN
INTRAMUSCULAR | Status: AC
  Filled 2023-02-08: qty 2

## 2023-02-08 MED ORDER — MIDAZOLAM HCL 2 MG/2ML IJ SOLN
INTRAMUSCULAR | Status: AC
  Filled 2023-02-08: qty 2

## 2023-02-08 NOTE — Progress Notes (Signed)
Patient canceled per surgeon due to patient drinking orange juice this am at 0630would need to wait until 1230 for procedure per anesthesia.  Surgeon could not wait until 1230 to perform procedure.

## 2023-02-10 ENCOUNTER — Other Ambulatory Visit: Payer: Self-pay | Admitting: Family Medicine

## 2023-02-10 DIAGNOSIS — I10 Essential (primary) hypertension: Secondary | ICD-10-CM

## 2023-02-12 ENCOUNTER — Encounter: Payer: 59 | Admitting: Family Medicine

## 2023-02-12 ENCOUNTER — Other Ambulatory Visit (INDEPENDENT_AMBULATORY_CARE_PROVIDER_SITE_OTHER): Payer: 59

## 2023-02-12 DIAGNOSIS — I10 Essential (primary) hypertension: Secondary | ICD-10-CM

## 2023-02-12 LAB — CBC WITH DIFFERENTIAL/PLATELET
Basophils Absolute: 0 10*3/uL (ref 0.0–0.1)
Basophils Relative: 0.7 % (ref 0.0–3.0)
Eosinophils Absolute: 0.3 10*3/uL (ref 0.0–0.7)
Eosinophils Relative: 6.7 % — ABNORMAL HIGH (ref 0.0–5.0)
HCT: 45.8 % (ref 39.0–52.0)
Hemoglobin: 15.2 g/dL (ref 13.0–17.0)
Lymphocytes Relative: 27.9 % (ref 12.0–46.0)
Lymphs Abs: 1.4 10*3/uL (ref 0.7–4.0)
MCHC: 33 g/dL (ref 30.0–36.0)
MCV: 93.7 fL (ref 78.0–100.0)
Monocytes Absolute: 0.4 10*3/uL (ref 0.1–1.0)
Monocytes Relative: 8.7 % (ref 3.0–12.0)
Neutro Abs: 2.8 10*3/uL (ref 1.4–7.7)
Neutrophils Relative %: 56 % (ref 43.0–77.0)
Platelets: 239 10*3/uL (ref 150.0–400.0)
RBC: 4.89 Mil/uL (ref 4.22–5.81)
RDW: 13.6 % (ref 11.5–15.5)
WBC: 5 10*3/uL (ref 4.0–10.5)

## 2023-02-12 LAB — COMPREHENSIVE METABOLIC PANEL
ALT: 24 U/L (ref 0–53)
AST: 24 U/L (ref 0–37)
Albumin: 4.5 g/dL (ref 3.5–5.2)
Alkaline Phosphatase: 44 U/L (ref 39–117)
BUN: 16 mg/dL (ref 6–23)
CO2: 29 meq/L (ref 19–32)
Calcium: 9.5 mg/dL (ref 8.4–10.5)
Chloride: 101 meq/L (ref 96–112)
Creatinine, Ser: 0.82 mg/dL (ref 0.40–1.50)
GFR: 97.42 mL/min (ref >60.00)
Glucose, Bld: 95 mg/dL (ref 70–99)
Potassium: 4.3 meq/L (ref 3.5–5.1)
Sodium: 137 meq/L (ref 135–145)
Total Bilirubin: 0.7 mg/dL (ref 0.2–1.2)
Total Protein: 6.7 g/dL (ref 6.0–8.3)

## 2023-02-12 LAB — LIPID PANEL
Cholesterol: 254 mg/dL — ABNORMAL HIGH (ref 0–200)
HDL: 35.7 mg/dL — ABNORMAL LOW (ref >39.00)
LDL Cholesterol: 183 mg/dL — ABNORMAL HIGH (ref 0–99)
NonHDL: 218.71
Total CHOL/HDL Ratio: 7
Triglycerides: 178 mg/dL — ABNORMAL HIGH (ref 0.0–149.0)
VLDL: 35.6 mg/dL (ref 0.0–40.0)

## 2023-02-14 ENCOUNTER — Inpatient Hospital Stay
Admission: RE | Admit: 2023-02-14 | Discharge: 2023-02-14 | Disposition: A | Discharge status: Home / Self Care | Payer: Self-pay | Source: Ambulatory Visit | Attending: Orthopedic Surgery | Admitting: Orthopedic Surgery

## 2023-02-14 ENCOUNTER — Telehealth: Payer: Self-pay

## 2023-02-14 ENCOUNTER — Other Ambulatory Visit: Payer: Self-pay | Admitting: Family Medicine

## 2023-02-14 DIAGNOSIS — Z049 Encounter for examination and observation for unspecified reason: Secondary | ICD-10-CM

## 2023-02-14 NOTE — Telephone Encounter (Signed)
Spoke with [facility]. The CD was mailed to the address that the fax machine generates on the cover sheet which is just Dean Bishop not our office. They will burn another CD and will mail that out for [place] today.

## 2023-02-14 NOTE — Telephone Encounter (Signed)
I spoke to the patient. He said that the day of his MRI he was told that they would fax the report and mail a CD. The CD has been requested from [facility]. His telephone visit has been rescheduled to 02/28/2023.

## 2023-02-14 NOTE — Telephone Encounter (Signed)
I searched for [facility] in [place] and they do not pull up I have even tried "inviting" them to connect and they have not accepted that either.  We will need a CD with the images on it. If we can't get this by tomorrow then we may need to reschedule his telephone appointment.

## 2023-02-14 NOTE — Progress Notes (Signed)
   Telephone Visit- Progress Note: Referring Physician:  No referring provider defined for this encounter.  Primary Physician:  Joaquim Nam, MD  This visit was performed via telephone.  Patient location: home Provider location: office  I spent a total of 15 minutes non-face-to-face activities for this visit on the date of this encounter including review of current clinical condition and response to treatment.    Patient has given verbal consent to this telephone visits and we reviewed the limitations of a telephone visit. Patient wishes to proceed.    Chief Complaint:  follow up MRI  History of Present Illness: Dean Bishop is a 57 y.o. male has a history of HTN, hyperlipidemia.   He is s/p right sided L4-L5 microdiscectomy on 11/06/22 by Dr. Katrinka Blazing.   Primary complaint at last visit was neck and right arm pain. Minimal improvement with dose pack.   Cervical MRI was ordered and phone visit scheduled to review it.   He still has some burning pain between his shoulder blades and intermittent right elbow pain with radiation of pain into his small and ring finger. This has been going on for about 4 weeks. He has constant numbness/tingling in the left arm. He has difficulty using the ring/small fingers (can't type). Not dropping things.    He is taking prn motrin with no relief. He has not restarted his neurontin.   Exam: No exam done as this was a telephone encounter.     Imaging: Cervical MRI dated 02/06/23:    I have personally reviewed the images and agree with the above interpretation.  Assessment and Plan: [name] is a pleasant 57 y.o. male with burning pain between his shoulder blades and intermittent right elbow pain with radiation of pain into his small and ring finger. He has constant numbness/tingling in the left arm. He has difficulty using the ring/small fingers (can't type).   He has known cervical spondylosis with multilevel foraminal stenosis. Moderate  right foraminal stenosis C3-C5. Severe right foraminal stenosis at C7-T1. Severe left foraminal stenosis C4-C6. Mild central stenosis C4-C6.   Pain between shoulder blades is likely from underlying cervical spondylosis. Right arm pain likely from foraminal stenosis, especially at C7-T1.   Treatment options discussed with patient and following plan made:   - He can restart neurotin (was on 200mg  tid previously). He will take 200mg  q hs x 3 days, then 200mg  bid x 3 days, then 200mg  tid. Reviewed dosing and side effects.  - Referral to Dr. Cherylann Ratel to consider cervical injections.  - Discussed PT for cervical spine. May revisit after injections.  - Follow up with me on 6 weeks and prn.   [name] PA-C Neurosurgery

## 2023-02-14 NOTE — Telephone Encounter (Signed)
-----   Message from [name] M sent at 02/14/2023  8:13 AM EST ----- He has phone visit to review MRI tomorrow.   He's the one that had MRI done in [place].   I don't see any images in his chart, any idea if we will have CD by tomorrow?

## 2023-02-14 NOTE — Telephone Encounter (Signed)
Noted  

## 2023-02-15 ENCOUNTER — Encounter: Payer: Self-pay | Admitting: Orthopedic Surgery

## 2023-02-15 ENCOUNTER — Ambulatory Visit (INDEPENDENT_AMBULATORY_CARE_PROVIDER_SITE_OTHER): Payer: 59 | Admitting: Orthopedic Surgery

## 2023-02-15 ENCOUNTER — Telehealth: Payer: 59 | Admitting: Orthopedic Surgery

## 2023-02-15 DIAGNOSIS — M4802 Spinal stenosis, cervical region: Secondary | ICD-10-CM | POA: Diagnosis not present

## 2023-02-15 DIAGNOSIS — M4722 Other spondylosis with radiculopathy, cervical region: Secondary | ICD-10-CM | POA: Diagnosis not present

## 2023-02-15 DIAGNOSIS — M5412 Radiculopathy, cervical region: Secondary | ICD-10-CM

## 2023-02-15 DIAGNOSIS — M47812 Spondylosis without myelopathy or radiculopathy, cervical region: Secondary | ICD-10-CM

## 2023-02-18 ENCOUNTER — Telehealth: Payer: Self-pay | Admitting: Student in an Organized Health Care Education/Training Program

## 2023-02-18 NOTE — Telephone Encounter (Signed)
This patient works out of town and will be here Dec 16-20. He is asking if he can come in for some TPI sometime on one of those days. Please ask Dr Cherylann Ratel. TPI does not require authorization.

## 2023-02-19 ENCOUNTER — Encounter: Payer: Self-pay | Admitting: Family Medicine

## 2023-02-19 ENCOUNTER — Ambulatory Visit (INDEPENDENT_AMBULATORY_CARE_PROVIDER_SITE_OTHER): Payer: 59 | Admitting: Family Medicine

## 2023-02-19 VITALS — BP 124/72 | HR 94 | Temp 98.4°F | Ht 74.5 in | Wt 249.0 lb

## 2023-02-19 DIAGNOSIS — F419 Anxiety disorder, unspecified: Secondary | ICD-10-CM

## 2023-02-19 DIAGNOSIS — M79603 Pain in arm, unspecified: Secondary | ICD-10-CM

## 2023-02-19 DIAGNOSIS — I10 Essential (primary) hypertension: Secondary | ICD-10-CM

## 2023-02-19 DIAGNOSIS — E785 Hyperlipidemia, unspecified: Secondary | ICD-10-CM

## 2023-02-19 DIAGNOSIS — Z23 Encounter for immunization: Secondary | ICD-10-CM

## 2023-02-19 DIAGNOSIS — Z0000 Encounter for general adult medical examination without abnormal findings: Secondary | ICD-10-CM | POA: Diagnosis not present

## 2023-02-19 DIAGNOSIS — M545 Low back pain, unspecified: Secondary | ICD-10-CM

## 2023-02-19 DIAGNOSIS — Z7189 Other specified counseling: Secondary | ICD-10-CM

## 2023-02-19 MED ORDER — LISINOPRIL 10 MG PO TABS: 10.0000 mg | ORAL_TABLET | Freq: Every day | ORAL | 3 refills | Status: DC

## 2023-02-19 MED ORDER — FLUOXETINE HCL 10 MG PO CAPS: 10.0000 mg | ORAL_CAPSULE | Freq: Every day | ORAL | 3 refills | Status: DC

## 2023-02-19 NOTE — Patient Instructions (Signed)
Update me as needed.  Take care.  Glad to see you.   

## 2023-02-19 NOTE — Progress Notes (Unsigned)
CPE- See plan.  Routine anticipatory guidance given to patient.  See health maintenance.  The possibility exists that previously documented standard health maintenance information may have been brought forward from a previous encounter into this note.  If needed, that same information has been updated to reflect the current situation based on today's encounter.    Tetanus 2022 Flu 2024 PNA 23 2020.  Shingles dw pt.   covid vaccine up to date.  Prostate cancer screening and PSA options (with potential risks and benefits of testing vs not testing) were discussed along with recent recs/guidelines.  He declined testing PSA at this point. [facility]  D/w pt. Done 2017.  Living will d/w pt.  Wife designated if patient were incapacitated.    Rare etoh.    Elevated Cholesterol: Using medications without problems: yes Muscle aches: not from statin, see below.   Diet compliance: d/w pt.  Exercise: d/w pt.  Diet and exercise affected by travel and pain.    He hasn't needed qvar recently- work environment changed.  No recent albuterol use.    Hypertension:    Using medication without problems or lightheadedness: yes Chest pain with exertion:no Edema:no Short of breath:no Labs d/w pt.    Mood d/w pt.  No SI/HI.  Still on prozac at baseline.  Compliant.  Mood is good.   His back pain is better.  His R toes are weaker with dorsiflexion but normal foot dorsiflexion.  Fall cautions d/w pt.    He had MRI done per outside clinic re: arm pain with plan for f/u injection.  He has R hand paresthesias and pain.  Recently started back on gabapentin since steroid use didn't help.    PMH and SH reviewed  Meds, vitals, and allergies reviewed.   ROS: Per HPI.  Unless specifically indicated otherwise in HPI, the patient denies:  General: fever. Eyes: acute vision changes ENT: sore throat Cardiovascular: chest pain Respiratory: SOB GI: vomiting GU:  dysuria Musculoskeletal: acute back pain Derm: acute rash Neuro: acute motor dysfunction Psych: worsening mood Endocrine: polydipsia Heme: bleeding Allergy: hayfever  GEN: nad, alert and oriented HEENT: mucous membranes moist NECK: supple w/o LA CV: rrr. PULM: ctab, no inc wob ABD: soft, +bs EXT: no edema SKIN: no acute rash His R toes are weaker with dorsiflexion but normal foot dorsiflexion. R forearm paresthesia.  Normal grip right hand.

## 2023-02-20 ENCOUNTER — Encounter: Payer: Self-pay | Admitting: Family Medicine

## 2023-02-20 DIAGNOSIS — F419 Anxiety disorder, unspecified: Secondary | ICD-10-CM | POA: Insufficient documentation

## 2023-02-20 DIAGNOSIS — M79603 Pain in arm, unspecified: Secondary | ICD-10-CM | POA: Insufficient documentation

## 2023-02-20 NOTE — Assessment & Plan Note (Signed)
Paresthesia noted but no weakness of grip on RUE He had MRI done per outside clinic re: arm pain with plan for f/u injection.  Recently started back on gabapentin since steroid use didn't help.  Per outside clinic.

## 2023-02-20 NOTE — Assessment & Plan Note (Signed)
No SI/HI.  Still on prozac at baseline.  Compliant.  Mood is good.  Continue as is.

## 2023-02-20 NOTE — Assessment & Plan Note (Signed)
Pain is clearly better.  Foot drop cautions discussed with patient.

## 2023-02-20 NOTE — Assessment & Plan Note (Signed)
Discussed diet and exercise.  Continue lisinopril.  Labs discussed with patient.

## 2023-02-20 NOTE — Assessment & Plan Note (Signed)
Tetanus 2022 Flu 2024 PNA 23 2020.  Shingles dw pt.   covid vaccine up to date.  Prostate cancer screening and PSA options (with potential risks and benefits of testing vs not testing) were discussed along with recent recs/guidelines.  He declined testing PSA at this point. [facility]  D/w pt. Done 2017.  Living will d/w pt.  Wife designated if patient were incapacitated.

## 2023-02-20 NOTE — Assessment & Plan Note (Signed)
Discussed diet and exercise.  Continue pravastatin.  Labs discussed with patient.

## 2023-02-20 NOTE — Assessment & Plan Note (Signed)
Living will d/w pt.  Wife designated if patient were incapacitated.   ?

## 2023-02-28 ENCOUNTER — Telehealth: Payer: 59 | Admitting: Orthopedic Surgery

## 2023-02-28 ENCOUNTER — Telehealth: Payer: Self-pay | Admitting: Orthopedic Surgery

## 2023-02-28 NOTE — Telephone Encounter (Signed)
Spoke with patient to clarify that he did have his appointment on 11.22.24 to review the MRI. He states that he thought he was still supposed to follow up with you today but just wanted to inform of the things below. He is aware of his appointments with Dr.Lateef and will follow up with our office in January.  ----- Message -----  From: [name]  Sent: 02/28/2023   8:46 AM EST  To: [facility]  Subject: Appointment Rescheduled                          Good Morning, I did not hear from you this morning so I can assume you are very busy.   My arm continues to have pain radiate from my elbo down my for arm and my two fingers Pinky and ring arm numb and tingling.  At time the pain is tremendous in my Medial antebrachial area of my arm.

## 2023-02-28 NOTE — Telephone Encounter (Signed)
See below

## 2023-03-04 ENCOUNTER — Encounter: Payer: Self-pay | Admitting: Ophthalmology

## 2023-03-05 ENCOUNTER — Encounter: Payer: Self-pay | Admitting: Anesthesiology

## 2023-03-05 NOTE — Anesthesia Preprocedure Evaluation (Signed)
Anesthesia Evaluation    Airway Mallampati: II  TM Distance: >3 FB     Dental   Dental bridge :   Pulmonary former smoker          Cardiovascular hypertension,      Neuro/Psych    GI/Hepatic   Endo/Other    Renal/GU      Musculoskeletal   Abdominal   Peds  Hematology   Anesthesia Other Findings Previous bleph 02-11-23 Dr. Juel Burrow   Medical History  Asthma  Anxiety Depression  Internal thrombosed hemorrhoids Hyperlipidemia  Irritability Allergy Hypertension Parsonage-Turner syndrome Right lumbar radiculopathy Hemorrhoids, internal, thrombosed  HNP (herniated nucleus pulposus), lumbar Right leg weakness  Vertigo HLD (hyperlipidemia)  Nerve pain Presence of dental bridge  Arthritis Motion sickness     Reproductive/Obstetrics                             Anesthesia Physical Anesthesia Plan  ASA: 2  Anesthesia Plan: MAC   Post-op Pain Management:    Induction: Intravenous  PONV Risk Score and Plan:   Airway Management Planned: Natural Airway and Nasal Cannula  Additional Equipment:   Intra-op Plan:   Post-operative Plan:   Informed Consent: I have reviewed the patients History and Physical, chart, labs and discussed the procedure including the risks, benefits and alternatives for the proposed anesthesia with the patient or authorized representative who has indicated his/her understanding and acceptance.     Dental Advisory Given  Plan Discussed with: Anesthesiologist, CRNA and Surgeon  Anesthesia Plan Comments: (Patient consented for risks of anesthesia including but not limited to:  - adverse reactions to medications - damage to eyes, teeth, lips or other oral mucosa - nerve damage due to positioning  - sore throat or hoarseness - Damage to heart, brain, nerves, lungs, other parts of body or loss of life  Patient voiced understanding and assent.)        Anesthesia Quick Evaluation

## 2023-03-07 ENCOUNTER — Telehealth: Payer: Self-pay

## 2023-03-07 DIAGNOSIS — M5126 Other intervertebral disc displacement, lumbar region: Secondary | ICD-10-CM

## 2023-03-07 DIAGNOSIS — Z9889 Other specified postprocedural states: Secondary | ICD-10-CM

## 2023-03-07 NOTE — Telephone Encounter (Signed)
I spoke to patient and he has been taking the 100mg  2 tablets TID. He will just do another 30 day supply of the 100mg  tablets and he will increase to 3 pills and see how it does and if not he will be able to go back down. He states he is scheduled for an injection and is hoping he will do good with that.

## 2023-03-07 NOTE — Telephone Encounter (Signed)
We discussed increasing his neurontin to 300mg  tid.   Does he have enough to get up to 300mg  tid as I instructed him?   If so, have him let me know next week if he is tolerating the 300mg  tid, then I can send in the 300mg  pills.

## 2023-03-07 NOTE — Telephone Encounter (Signed)
We have received a fax from the pharmacy to refill his gabapentin and sometimes these come automatically, can you ask him if he needs a refill of this?

## 2023-03-07 NOTE — Telephone Encounter (Signed)
Yes he needs this medication refill. The pharmacy told him that in order for his insurance to pay it needs to be a 90 day supply.

## 2023-03-08 MED ORDER — GABAPENTIN 100 MG PO CAPS: 200.0000 mg | ORAL_CAPSULE | Freq: Three times a day (TID) | ORAL | 0 refills | Status: DC

## 2023-03-08 NOTE — Addendum Note (Signed)
Addended byDrake Leach on: 03/08/2023 09:02 AM   Modules accepted: Orders

## 2023-03-08 NOTE — Telephone Encounter (Signed)
I sent a one month supply of gabapentin 100mg  to take 2 tid to [facility]. Please let him know.

## 2023-03-12 ENCOUNTER — Ambulatory Visit: Payer: 59 | Admitting: Student in an Organized Health Care Education/Training Program

## 2023-03-12 NOTE — Discharge Instructions (Signed)

## 2023-03-13 ENCOUNTER — Ambulatory Visit
Admission: RE | Admit: 2023-03-13 | Discharge: 2023-03-13 | Disposition: A | Discharge status: Home / Self Care | Payer: 59 | Source: Ambulatory Visit | Attending: Student in an Organized Health Care Education/Training Program | Admitting: Student in an Organized Health Care Education/Training Program

## 2023-03-13 ENCOUNTER — Ambulatory Visit
Payer: 59 | Attending: Student in an Organized Health Care Education/Training Program | Admitting: Student in an Organized Health Care Education/Training Program

## 2023-03-13 ENCOUNTER — Encounter: Payer: Self-pay | Admitting: Student in an Organized Health Care Education/Training Program

## 2023-03-13 VITALS — BP 134/94 | HR 82 | Temp 97.7°F | Resp 16 | Ht 74.0 in | Wt 250.0 lb

## 2023-03-13 DIAGNOSIS — M5412 Radiculopathy, cervical region: Secondary | ICD-10-CM

## 2023-03-13 DIAGNOSIS — M4802 Spinal stenosis, cervical region: Secondary | ICD-10-CM | POA: Insufficient documentation

## 2023-03-13 DIAGNOSIS — M4722 Other spondylosis with radiculopathy, cervical region: Secondary | ICD-10-CM | POA: Insufficient documentation

## 2023-03-13 MED ORDER — LIDOCAINE HCL (PF) 1 % IJ SOLN
INTRAMUSCULAR | Status: AC
  Filled 2023-03-13: qty 5

## 2023-03-13 MED ORDER — SODIUM CHLORIDE 0.9% FLUSH
1.0000 mL | Freq: Once | INTRAVENOUS | Status: AC
  Administered 2023-03-13: 1 mL

## 2023-03-13 MED ORDER — LIDOCAINE HCL 2 % IJ SOLN
20.0000 mL | Freq: Once | INTRAMUSCULAR | Status: AC
  Administered 2023-03-13: 100 mg

## 2023-03-13 MED ORDER — DEXAMETHASONE SODIUM PHOSPHATE 10 MG/ML IJ SOLN
10.0000 mg | Freq: Once | INTRAMUSCULAR | Status: AC
  Administered 2023-03-13: 10 mg

## 2023-03-13 MED ORDER — IOHEXOL 180 MG/ML  SOLN
10.0000 mL | Freq: Once | INTRAMUSCULAR | Status: AC
  Administered 2023-03-13: 10 mL via EPIDURAL
  Filled 2023-03-13: qty 20

## 2023-03-13 MED ORDER — ROPIVACAINE HCL 2 MG/ML IJ SOLN
INTRAMUSCULAR | Status: AC
  Filled 2023-03-13: qty 20

## 2023-03-13 MED ORDER — SODIUM CHLORIDE (PF) 0.9 % IJ SOLN
INTRAMUSCULAR | Status: AC
  Filled 2023-03-13: qty 10

## 2023-03-13 MED ORDER — DEXAMETHASONE SODIUM PHOSPHATE 10 MG/ML IJ SOLN
INTRAMUSCULAR | Status: AC
  Filled 2023-03-13: qty 1

## 2023-03-13 MED ORDER — ROPIVACAINE HCL 2 MG/ML IJ SOLN
1.0000 mL | Freq: Once | INTRAMUSCULAR | Status: AC
  Administered 2023-03-13: 1 mL via EPIDURAL

## 2023-03-13 NOTE — Progress Notes (Signed)
Safety precautions to be maintained throughout the outpatient stay will include: orient to surroundings, keep bed in low position, maintain call bell within reach at all times, provide assistance with transfer out of bed and ambulation.  

## 2023-03-13 NOTE — Progress Notes (Signed)
PROVIDER NOTE: Interpretation of information contained herein should be left to medically-trained personnel. Specific patient instructions are provided elsewhere under "Patient Instructions" section of medical record. This document was created in part using STT-dictation technology, any transcriptional errors that may result from this process are unintentional.  Patient: Dean Bishop Type: Established DOB: [id number] MRN: [id number] PCP: Joaquim Nam, MD  Service: Procedure DOS: 03/13/2023 Setting: Ambulatory Location: Ambulatory outpatient facility Delivery: Face-to-face Provider: Edward Jolly, MD Specialty: Interventional Pain Management Specialty designation: 09 Location: Outpatient facility Ref. Prov.: Joaquim Nam, MD       Interventional Therapy   Procedure: Cervical Epidural Steroid injection (CESI) (Interlaminar) #1  Laterality: Right  Level: C7-T1 Imaging: Fluoroscopy-assisted DOS: 03/13/2023  Performed by: Edward Jolly, MD Anesthesia: Local anesthesia (1-2% Lidocaine) Sedation: No Sedation                         Purpose: Diagnostic/Therapeutic Indications: Cervicalgia, cervical radicular pain, degenerative disc disease, severe enough to impact quality of life or function. 1. Cervical radicular pain (RIGHT)   2. Foraminal stenosis of cervical region    Cervical MRI dated 02/06/23:   He has known cervical spondylosis with multilevel foraminal stenosis. Moderate right foraminal stenosis C3-C5. Severe right foraminal stenosis at C7-T1. Severe left foraminal stenosis C4-C6. Mild central stenosis C4-C6.    NAS-11 score:   Pre-procedure: 3 /10   Post-procedure: 0-No pain/10      Position  Prep  Materials:  Location setting: Procedure suite Position: Prone, on modified reverse trendelenburg to facilitate breathing, with head in head-cradle. Pillows positioned under chest (below chin-level) with cervical spine flexed. Safety Precautions: Patient was assessed for  positional comfort and pressure points before starting the procedure. Prepping solution: DuraPrep (Iodine Povacrylex [0.7% available iodine] and Isopropyl Alcohol, 74% w/w) Prep Area: Entire  cervicothoracic region Approach: percutaneous, paramedial Intended target: Posterior cervical epidural space Materials Procedure:  Tray: Epidural Needle(s): Epidural (Tuohy) Qty: 1 Length: (90mm) 3.5-inch Gauge: 22G   H&P (Pre-op Assessment):  [name] is a 57 y.o. (year old), male patient, seen today for interventional treatment. He  has a past surgical history that includes Shoulder surgery (Right, 2017); Lumbar laminectomy/decompression microdiscectomy (Right, 11/06/2022); Colonoscopy with propofol (N/A, 01/21/2023); and polypectomy (01/21/2023). [name] has a current medication list which includes the following prescription(s): albuterol, beclomethasone, fluoxetine, gabapentin, lisinopril, meclizine, and pravastatin. His primarily concern today is the No chief complaint on file.  Initial Vital Signs:  Pulse/HCG Rate: 82ECG Heart Rate: 77 Temp: 97.7 F (36.5 C) Resp: 16 BP: (!) 137/99 SpO2: 97 %  BMI: Estimated body mass index is 32.1 kg/m as calculated from the following:   Height as of this encounter: 6\' 2"  (1.88 m).   Weight as of this encounter: 250 lb (113.4 kg).  Risk Assessment: Allergies: Reviewed. He is allergic to advair diskus [fluticasone-salmeterol] and lipitor [atorvastatin calcium].  Allergy Precautions: None required Coagulopathies: Reviewed. None identified.  Blood-thinner therapy: None at this time Active Infection(s): Reviewed. None identified. [name] is afebrile  Site Confirmation: [name] was asked to confirm the procedure and laterality before marking the site Procedure checklist: Completed Consent: Before the procedure and under the influence of no sedative(s), amnesic(s), or anxiolytics, the patient was informed of the treatment options, risks and possible  complications. To fulfill our ethical and legal obligations, as recommended by the American Medical Association's Code of Ethics, I have informed the patient of my clinical impression; the nature and purpose of  the treatment or procedure; the risks, benefits, and possible complications of the intervention; the alternatives, including doing nothing; the risk(s) and benefit(s) of the alternative treatment(s) or procedure(s); and the risk(s) and benefit(s) of doing nothing. The patient was provided information about the general risks and possible complications associated with the procedure. These may include, but are not limited to: failure to achieve desired goals, infection, bleeding, organ or nerve damage, allergic reactions, paralysis, and death. In addition, the patient was informed of those risks and complications associated to Spine-related procedures, such as failure to decrease pain; infection (i.e.: Meningitis, epidural or intraspinal abscess); bleeding (i.e.: epidural hematoma, subarachnoid hemorrhage, or any other type of intraspinal or peri-dural bleeding); organ or nerve damage (i.e.: Any type of peripheral nerve, nerve root, or spinal cord injury) with subsequent damage to sensory, motor, and/or autonomic systems, resulting in permanent pain, numbness, and/or weakness of one or several areas of the body; allergic reactions; (i.e.: anaphylactic reaction); and/or death. Furthermore, the patient was informed of those risks and complications associated with the medications. These include, but are not limited to: allergic reactions (i.e.: anaphylactic or anaphylactoid reaction(s)); adrenal axis suppression; blood sugar elevation that in diabetics may result in ketoacidosis or comma; water retention that in patients with history of congestive heart failure may result in shortness of breath, pulmonary edema, and decompensation with resultant heart failure; weight gain; swelling or edema; medication-induced  neural toxicity; particulate matter embolism and blood vessel occlusion with resultant organ, and/or nervous system infarction; and/or aseptic necrosis of one or more joints. Finally, the patient was informed that Medicine is not an exact science; therefore, there is also the possibility of unforeseen or unpredictable risks and/or possible complications that may result in a catastrophic outcome. The patient indicated having understood very clearly. We have given the patient no guarantees and we have made no promises. Enough time was given to the patient to ask questions, all of which were answered to the patient's satisfaction. Mr. Fuentez has indicated that he wanted to continue with the procedure. Attestation: I, the ordering provider, attest that I have discussed with the patient the benefits, risks, side-effects, alternatives, likelihood of achieving goals, and potential problems during recovery for the procedure that I have provided informed consent. Date  Time: 03/13/2023 10:30 AM   Pre-Procedure Preparation:  Monitoring: As per clinic protocol. Respiration, ETCO2, SpO2, BP, heart rate and rhythm monitor placed and checked for adequate function Safety Precautions: Patient was assessed for positional comfort and pressure points before starting the procedure. Time-out: I initiated and conducted the "Time-out" before starting the procedure, as per protocol. The patient was asked to participate by confirming the accuracy of the "Time Out" information. Verification of the correct person, site, and procedure were performed and confirmed by me, the nursing staff, and the patient. "Time-out" conducted as per Joint Commission's Universal Protocol (UP.01.01.01). Time: 1137 Start Time: 1137 hrs.  Description  Narrative of Procedure:          Rationale (medical necessity): procedure needed and proper for the diagnosis and/or treatment of the patient's medical symptoms and needs. Start Time: 1137  hrs. Safety Precautions: Aspiration looking for blood return was conducted prior to all injections. At no point did we inject any substances, as a needle was being advanced. No attempts were made at seeking any paresthesias. Safe injection practices and needle disposal techniques used. Medications properly checked for expiration dates. SDV (single dose vial) medications used. Description of procedure: Protocol guidelines were followed. The patient was assisted  into a comfortable position. The target area was identified and the area prepped in the usual manner. Skin & deeper tissues infiltrated with local anesthetic. Appropriate amount of time allowed to pass for local anesthetics to take effect. Using fluoroscopic guidance, the epidural needle was introduced through the skin, ipsilateral to the reported pain, and advanced to the target area. Posterior laminar os was contacted and the needle walked caudad, until the lamina was cleared. The ligamentum flavum was engaged and the epidural space identified using "loss-of-resistance technique" with 2-3 ml of PF-NaCl (0.9% NSS), in a 5cc dedicated LOR syringe. (See "Imaging guidance" below for use of contrast details.) Once proper needle placement was secured, and negative aspiration confirmed, the solution was injected in intermittent fashion, asking for systemic symptoms every 0.5cc. The needles were then removed and the area cleansed, making sure to leave some of the prepping solution back to take advantage of its long term bactericidal properties.  Vitals:   03/13/23 1042 03/13/23 1136 03/13/23 1141  BP: (!) 137/99 (!) 143/105 (!) 134/94  Pulse: 82    Resp: 16 15 16   Temp: 97.7 F (36.5 C)    TempSrc: Temporal    SpO2: 97% 98% 96%  Weight: 250 lb (113.4 kg)    Height: 6\' 2"  (1.88 m)       End Time: 1141 hrs.  Imaging Guidance (Spinal):          Type of Imaging Technique: Fluoroscopy Guidance (Spinal) Indication(s): Fluoroscopy guidance for needle  placement to enhance accuracy in procedures requiring precise needle localization for targeted delivery of medication in or near specific anatomical locations not easily accessible without such real-time imaging assistance. Exposure Time: Please see nurses notes. Contrast: Before injecting any contrast, we confirmed that the patient did not have an allergy to iodine, shellfish, or radiological contrast. Once satisfactory needle placement was completed at the desired level, radiological contrast was injected. Contrast injected under live fluoroscopy. No contrast complications. See chart for type and volume of contrast used. Fluoroscopic Guidance: I was personally present during the use of fluoroscopy. "Tunnel Vision Technique" used to obtain the best possible view of the target area. Parallax error corrected before commencing the procedure. "Direction-depth-direction" technique used to introduce the needle under continuous pulsed fluoroscopy. Once target was reached, antero-posterior, oblique, and lateral fluoroscopic projection used confirm needle placement in all planes. Images permanently stored in EMR. Interpretation: I personally interpreted the imaging intraoperatively. Adequate needle placement confirmed in multiple planes. Appropriate spread of contrast into desired area was observed. No evidence of afferent or efferent intravascular uptake. No intrathecal or subarachnoid spread observed. Permanent images saved into the patient's record.  Post-operative Assessment:  Post-procedure Vital Signs:  Pulse/HCG Rate: 8274 Temp: 97.7 F (36.5 C) Resp: 16 BP: (!) 134/94 SpO2: 96 %  EBL: None  Complications: No immediate post-treatment complications observed by team, or reported by patient.  Note: The patient tolerated the entire procedure well. A repeat set of vitals were taken after the procedure and the patient was kept under observation following institutional policy, for this type of procedure.  Post-procedural neurological assessment was performed, showing return to baseline, prior to discharge. The patient was provided with post-procedure discharge instructions, including a section on how to identify potential problems. Should any problems arise concerning this procedure, the patient was given instructions to immediately contact us, at any time, without hesitation. In any case, we plan to contact the patient by telephone for a follow-up status report regarding this interventional procedure.  Comments:  No additional relevant information.  Plan of Care (POC)  Orders:  Orders Placed This Encounter  Procedures   Cervical Epidural Injection    Indication(s): Radiculitis and cervicalgia associated with cervical degenerative disc disease. Position: Prone Imaging guidance: Fluoroscopy required. Contrast required unless contraindicated by allergy or severe CKD. Equipment & Materials: Epidural tray & needle.    Scheduling Instructions:     Procedure: Cervical Epidural Steroid Injection/Block     Planned Level(s): C7-T1     Laterality: RIGHT     Anxiolysis: w/o     Timeframe: Today    Where will this procedure be performed?:   [facility]             Adrienna Karis   [facility] C-ARM 1-60 MIN NO REPORT    Intraoperative interpretation by procedural physician at [facility].    Standing Status:   Standing    Number of Occurrences:   1    Reason for exam::   Assistance in needle guidance and placement for procedures requiring needle placement in or near specific anatomical locations not easily accessible without such assistance.   Medications ordered for procedure: Meds ordered this encounter  Medications   iohexol (OMNIPAQUE) 180 MG/ML injection 10 mL    Must be Myelogram-compatible. If not available, you may substitute with a water-soluble, non-ionic, hypoallergenic, myelogram-compatible radiological contrast medium.   lidocaine (XYLOCAINE) 2 % (with pres) injection  400 mg   ropivacaine (PF) 2 mg/mL (0.2%) (NAROPIN) injection 1 mL   sodium chloride flush (NS) 0.9 % injection 1 mL   dexamethasone (DECADRON) injection 10 mg   Medications administered: We administered iohexol, lidocaine, ropivacaine (PF) 2 mg/mL (0.2%), sodium chloride flush, and dexamethasone.  See the medical record for exact dosing, route, and time of administration.  Follow-up plan:   Return in about 13 days (around 03/26/2023) for PPE, VV.       Recent Visits Date Type Provider Dept  02/04/23 Office Visit Edward Jolly, MD [facility]  12/31/22 Procedure visit Edward Jolly, MD [facility]  12/20/22 Office Visit Edward Jolly, MD [facility]  Showing recent visits within past 90 days and meeting all other requirements Today's Visits Date Type Provider Dept  03/13/23 Procedure visit Edward Jolly, MD [facility]  Showing today's visits and meeting all other requirements Future Appointments Date Type Provider Dept  03/26/23 Appointment Edward Jolly, MD [facility]  Showing future appointments within next 90 days and meeting all other requirements  Disposition: Discharge home  Discharge (Date  Time): 03/13/2023; 1150 hrs.   Primary Care Physician: Joaquim Nam, MD Location: [facility] Note by: Edward Jolly, MD (TTS technology used. I apologize for any typographical errors that were not detected and corrected.) Date: 03/13/2023; Time: 11:59 AM  Disclaimer:  Medicine is not an Visual merchandiser. The only guarantee in medicine is that nothing is guaranteed. It is important to note that the decision to proceed with this intervention was based on the information collected from the patient. The Data and conclusions were drawn from the patient's questionnaire, the interview, and the physical examination. Because the information was provided in large part by the patient, it cannot be guaranteed that it  has not been purposely or unconsciously manipulated. Every effort has been made to obtain as much relevant data as possible for this evaluation. It is important to note that the conclusions that lead to this procedure are derived in large part from  the available data. Always take into account that the treatment will also be dependent on availability of resources and existing treatment guidelines, considered by other Pain Management Practitioners as being common knowledge and practice, at the time of the intervention. For Medico-Legal purposes, it is also important to point out that variation in procedural techniques and pharmacological choices are the acceptable norm. The indications, contraindications, technique, and results of the above procedure should only be interpreted and judged by a Board-Certified Interventional Pain Specialist with extensive familiarity and expertise in the same exact procedure and technique.

## 2023-03-13 NOTE — Patient Instructions (Signed)

## 2023-03-14 ENCOUNTER — Telehealth: Payer: Self-pay

## 2023-03-14 NOTE — Telephone Encounter (Signed)
Post procedure follow up.  Patient states he is doing fine.  

## 2023-03-15 ENCOUNTER — Ambulatory Visit: Admission: RE | Admit: 2023-03-15 | Payer: 59 | Source: Home / Self Care | Admitting: Ophthalmology

## 2023-03-15 HISTORY — DX: Unspecified osteoarthritis, unspecified site: M19.90

## 2023-03-15 HISTORY — DX: Motion sickness, initial encounter: T75.3XXA

## 2023-03-15 SURGERY — BLEPHAROPLASTY
Anesthesia: Monitor Anesthesia Care | Laterality: Bilateral

## 2023-03-26 ENCOUNTER — Ambulatory Visit
Payer: 59 | Attending: Student in an Organized Health Care Education/Training Program | Admitting: Student in an Organized Health Care Education/Training Program

## 2023-03-26 DIAGNOSIS — M4802 Spinal stenosis, cervical region: Secondary | ICD-10-CM

## 2023-03-26 DIAGNOSIS — M5412 Radiculopathy, cervical region: Secondary | ICD-10-CM

## 2023-03-26 NOTE — Progress Notes (Signed)
  I attempted to call the patient however no response. Voicemail left instructing patient to call front desk office at [phone] to reschedule appointment. -Dr Marcelino  Post-procedure evaluation    Procedure: Cervical Epidural Steroid injection (CESI) (Interlaminar) #1  Laterality: Right  Level: C7-T1 Imaging: Fluoroscopy-assisted DOS: 03/13/2023  Performed by: Wallie Marcelino, MD Anesthesia: Local anesthesia (1-2% Lidocaine ) Sedation: No Sedation                         Purpose: Diagnostic/Therapeutic Indications: Cervicalgia, cervical radicular pain, degenerative disc disease, severe enough to impact quality of life or function. 1. Cervical radicular pain (RIGHT)   2. Foraminal stenosis of cervical region    Cervical MRI dated 02/06/23:   He has known cervical spondylosis with multilevel foraminal stenosis. Moderate right foraminal stenosis C3-C5. Severe right foraminal stenosis at C7-T1. Severe left foraminal stenosis C4-C6. Mild central stenosis C4-C6.    NAS-11 score:   Pre-procedure: 3 /10   Post-procedure: 0-No pain/10     Effectiveness:  Initial hour after procedure: 100 %  Subsequent 4-6 hours post-procedure: 100 %  Analgesia past initial 6 hours: 0 %  Ongoing improvement:  Analgesic:  0%

## 2023-04-05 NOTE — Progress Notes (Deleted)
 Referring Physician:  Donnie Galea, MD [address] [place],  [place] [address]  Primary Physician:  Donnie Galea, MD  History of Present Illness: 04/05/2023*** Mr. Sharad Vaneaton has a history of HTN, asthma, parsonage-turner syndrome, hyperlipidemia.   He is s/p right L4-L5 microdiscectomy on 11/06/22.   Did phone visit with him on 02/15/23 to review his cervical MRI. He has known cervical spondylosis with multilevel foraminal stenosis. Moderate right foraminal stenosis C3-C5. Severe right foraminal stenosis at C7-T1. Severe left foraminal stenosis C4-C6. Mild central stenosis C4-C6.    Pain between shoulder blades is likely from underlying cervical spondylosis. Right arm pain likely from foraminal stenosis, especially at C7-T1.  He was to restart his neurontin  and was sent to pain management for injections.   He had right C7-T1 IL ESI on 03/13/23.    He is taking neurontin .     Conservative measures:  Physical therapy: no  Multimodal medical therapy including regular antiinflammatories: neurontin   Injections:  Right C7-T1 IL ESI on 03/13/23  Past Surgery: ***  Brennan O Lamy has ***no symptoms of cervical myelopathy.  The symptoms are causing a significant impact on the patient's life.   Review of Systems:  A 10 point review of systems is negative, except for the pertinent positives and negatives detailed in the HPI.  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 Valera Gaster- due to industrial , no longer exposed   Depression    Hemorrhoids, internal, thrombosed    HLD (hyperlipidemia)    HNP (herniated nucleus pulposus), lumbar    Hyperlipidemia    Hypertension    Internal thrombosed hemorrhoids    Irritability    Motion sickness    Nerve pain    right arm   Parsonage-Turner syndrome    Presence of dental bridge    permanent upper    Right leg weakness    Right lumbar radiculopathy    Vertigo     Past Surgical History: Past Surgical History:  Procedure Laterality Date   COLONOSCOPY WITH PROPOFOL  N/A 01/21/2023   Procedure: COLONOSCOPY WITH PROPOFOL ;  Surgeon: Selena Daily, MD;  Location: [facility] ENDOSCOPY;  Service: Gastroenterology;  Laterality: N/A;   LUMBAR LAMINECTOMY/DECOMPRESSION MICRODISCECTOMY Right 11/06/2022   Procedure: RIGHT L4-5 MICRODISCECTOMY;  Surgeon: Aurther Blue, MD;  Location: [facility] ORS;  Service: Neurosurgery;  Laterality: Right;   POLYPECTOMY  01/21/2023   Procedure: POLYPECTOMY;  Surgeon: Selena Daily, MD;  Location: [facility] ENDOSCOPY;  Service: Gastroenterology;;   SHOULDER SURGERY Right 2017    Allergies: Allergies as of 04/10/2023 - Review Complete 03/26/2023  Allergen Reaction Noted   Advair diskus [fluticasone -salmeterol]  05/04/2013   Lipitor [atorvastatin calcium] Other (See Comments) 10/15/2017    Medications: Outpatient Encounter Medications as of 04/10/2023  Medication Sig   albuterol  (VENTOLIN  HFA) 108 (90 Base) MCG/ACT inhaler Inhale into the lungs every 6 (six) hours as needed for wheezing or shortness of breath.   beclomethasone (QVAR ) 80 MCG/ACT inhaler Inhale into the lungs 2 (two) times daily as needed.   FLUoxetine  (PROZAC ) 10 MG capsule Take 1 capsule (10 mg total) by mouth daily.   gabapentin  (NEURONTIN ) 100 MG capsule Take 2 capsules (200 mg total) by mouth 3 (three) times daily.   lisinopril  (ZESTRIL ) 10 MG tablet Take 1 tablet (10 mg total) by mouth  daily.   meclizine  (ANTIVERT ) 12.5 MG tablet TAKE 1 TO 2 TABLETS(12.5 TO 25 MG) BY MOUTH THREE TIMES DAILY AS NEEDED FOR DIZZINESS   pravastatin  (PRAVACHOL ) 80 MG tablet TAKE 1 TABLET BY MOUTH DAILY   No facility-administered encounter medications on file as of 04/10/2023.    Social History: Social History   Tobacco Use   Smoking status: Former    Current packs/day: 0.00    Average packs/day: 0.5  packs/day for 14.8 years (7.4 ttl pk-yrs)    Types: Cigarettes    Start date: 5    Quit date: 01/24/2021    Years since quitting: 2.1   Smokeless tobacco: Former    Quit date: 03/26/2000  Vaping Use   Vaping status: Never Used  Substance Use Topics   Alcohol use: Yes    Comment: rarely   Drug use: No    Family Medical History: Family History  Problem Relation Age of Onset   Hypertension Mother    Alcohol abuse Father    Diabetes Father    Heart disease Father    Hypertension Sister    Hypertension Brother    Colon cancer Maternal Uncle        with agent orange exposure.   Heart disease Paternal Grandfather        CABG x 2   Cancer Neg Hx    Stroke Neg Hx    Prostate cancer Neg Hx     Physical Examination: There were no vitals filed for this visit.  General: In no acute distress.   Awake, alert, oriented to person, place, and time.  Pupils equal round and reactive to light.  Facial tone is symmetric.     No posterior cervical tenderness. No tenderness in bilateral trapezial region.    No tenderness about the right elbow. Good ROM with no pain.    Well healed lumbar incision. Mild tenderness over right SI joint.    Strength: Side Biceps Triceps Deltoid Interossei Grip Wrist Ext. Wrist Flex.  R 5 5 5 5 5 5 5   L 5 5 5 5 5 5 5     Side Iliopsoas Quads Hamstring PF DF EHL  R 5 5 5 5 4 2   L 5 5 5 5 5 5     Reflexes are 2+ and symmetric at the biceps, brachioradialis.   Hoffman's is absent.    Bilateral upper and lower extremity sensation is intact to light touch.      Gait is normal.   Medical Decision Making  Imaging: none  I have personally reviewed the images and agree with the above interpretation.  Assessment and Plan: Mr. Mccombie is a pleasant 58 y.o. male with burning pain between his shoulder blades and intermittent right elbow pain with radiation of pain into his small and ring finger. He has constant numbness/tingling in the left arm. He has  difficulty using the ring/small fingers (can't type).    He has known cervical spondylosis with multilevel foraminal stenosis. Moderate right foraminal stenosis C3-C5. Severe right foraminal stenosis at C7-T1. Severe left foraminal stenosis C4-C6. Mild central stenosis C4-C6.    Pain between shoulder blades is likely from underlying cervical spondylosis. Right arm pain likely from foraminal stenosis, especially at C7-T1.    Treatment options discussed with patient and following plan made:    - He can restart neurotin (was on 200mg  tid previously). He will take 200mg  q hs x 3 days, then 200mg  bid x 3 days, then 200mg  tid. Reviewed dosing and  side effects.  - Referral to Dr. Rhesa Celeste to consider cervical injections.  - Discussed PT for cervical spine. May revisit after injections.  - Follow up with me on 6 weeks and prn.   Treatment options discussed with patient and following plan made:   - Order for physical therapy for *** spine ***. Patient to call to schedule appointment. *** - Continue current medications including ***. Reviewed dosing and side effects.  - Prescription for ***. Reviewed dosing and side effects. Take with food.  - Prescription for *** to take prn muscle spasms. Reviewed dosing and side effects. Discussed this can cause drowsiness.  - MRI of *** to further evaluate *** radiculopathy. No improvement time or medications (***).  - Referral to PM[facility] at [facility] to discuss possible *** injections.  - Will schedule phone visit to review MRI results once I get them back.   I spent a total of *** minutes in face-to-face and non-face-to-face activities related to this patient's care today including review of outside records, review of imaging, review of symptoms, physical exam, discussion of differential diagnosis, discussion of treatment options, and documentation.   Thank you for involving me in the care of this patient.   Lucetta Russel PA-C Dept. of Neurosurgery

## 2023-04-09 NOTE — Progress Notes (Signed)
Referring Physician:  No referring provider defined for this encounter.  Primary Physician:  Joaquim Nam, MD  History of Present Illness: 04/15/2023 [name] has a history of HTN, asthma, parsonage-turner syndrome, hyperlipidemia.   He is s/p right L4-L5 microdiscectomy on 11/06/22.   Did phone visit with him on 02/15/23 to review his cervical MRI. He has known cervical spondylosis with multilevel foraminal stenosis. Moderate right foraminal stenosis C3-C5. Severe right foraminal stenosis at C7-T1. Severe left foraminal stenosis C4-C6. Mild central stenosis C4-C6.    Pain between shoulder blades is likely from underlying cervical spondylosis. Right arm pain likely from foraminal stenosis, especially at C7-T1.  He was to restart his neurontin (200mg  tid) and was sent to pain management for injections.   He had right C7-T1 IL ESI on 03/13/23 which helped for a few hours. He continues with intermittent neck pain and more constant numbness/tingling in right arm. He has seen improvement in right arm pain since onset. No weakness noted.   He continues with right foot weakness as well.   He stopped neurontin- did not notice a change when taking it versus not taking it.   He is still travelling to [facility] for weeks at a time for work.   Conservative measures:  Physical therapy: no  Multimodal medical therapy including regular antiinflammatories: neurontin  Injections:  Right C7-T1 IL ESI on 03/13/23  Past Surgery:  Right L4-L5 microdiscectomy on 11/06/22  Dean Bishop has no symptoms of cervical myelopathy.  The symptoms are causing a significant impact on the patient's life.   Review of Systems:  A 10 point review of systems is negative, except for the pertinent positives and negatives detailed in the HPI.  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  Barnetta Chapel- due to industrial , no longer exposed   Depression    Hemorrhoids, internal, thrombosed    HLD (hyperlipidemia)    HNP (herniated nucleus pulposus), lumbar    Hyperlipidemia    Hypertension    Internal thrombosed hemorrhoids    Irritability    Motion sickness    Nerve pain    right arm   Parsonage-Turner syndrome    Presence of dental bridge    permanent upper   Right leg weakness    Right lumbar radiculopathy    Vertigo     Past Surgical History: Past Surgical History:  Procedure Laterality Date   COLONOSCOPY WITH PROPOFOL N/A 01/21/2023   Procedure: COLONOSCOPY WITH PROPOFOL;  Surgeon: Toney Reil, MD;  Location: [facility] ENDOSCOPY;  Service: Gastroenterology;  Laterality: N/A;   LUMBAR LAMINECTOMY/DECOMPRESSION MICRODISCECTOMY Right 11/06/2022   Procedure: RIGHT L4-5 MICRODISCECTOMY;  Surgeon: Loreen Freud, MD;  Location: [facility] ORS;  Service: Neurosurgery;  Laterality: Right;   POLYPECTOMY  01/21/2023   Procedure: POLYPECTOMY;  Surgeon: Toney Reil, MD;  Location: [facility] ENDOSCOPY;  Service: Gastroenterology;;   SHOULDER SURGERY Right 2017    Allergies: Allergies as of 04/15/2023 - Review Complete 04/15/2023  Allergen Reaction Noted   Advair diskus [fluticasone-salmeterol]  05/04/2013   Lipitor [atorvastatin calcium] Other (See Comments) 10/15/2017    Medications: Outpatient Encounter Medications as of 04/15/2023  Medication Sig   albuterol (VENTOLIN HFA) 108 (90 Base) MCG/ACT inhaler Inhale into the lungs every 6 (six) hours as needed for wheezing or shortness of breath.  beclomethasone (QVAR) 80 MCG/ACT inhaler Inhale into the lungs 2 (two) times daily as needed.   FLUoxetine (PROZAC) 10 MG capsule Take 1 capsule (10 mg total) by mouth daily.   gabapentin (NEURONTIN) 100 MG capsule Take 2 capsules (200 mg total) by mouth 3 (three) times daily.   lisinopril (ZESTRIL) 10 MG tablet Take 1 tablet (10 mg total) by mouth daily.   meclizine  (ANTIVERT) 12.5 MG tablet TAKE 1 TO 2 TABLETS(12.5 TO 25 MG) BY MOUTH THREE TIMES DAILY AS NEEDED FOR DIZZINESS   pravastatin (PRAVACHOL) 80 MG tablet TAKE 1 TABLET BY MOUTH DAILY   No facility-administered encounter medications on file as of 04/15/2023.    Social History: Social History   Tobacco Use   Smoking status: Former    Current packs/day: 0.00    Average packs/day: 0.5 packs/day for 14.8 years (7.4 ttl pk-yrs)    Types: Cigarettes    Start date: 89    Quit date: 01/24/2021    Years since quitting: 2.2   Smokeless tobacco: Former    Quit date: 03/26/2000  Vaping Use   Vaping status: Never Used  Substance Use Topics   Alcohol use: Yes    Comment: rarely   Drug use: No    Family Medical History: Family History  Problem Relation Age of Onset   Hypertension Mother    Alcohol abuse Father    Diabetes Father    Heart disease Father    Hypertension Sister    Hypertension Brother    Colon cancer Maternal Uncle        with agent orange exposure.   Heart disease Paternal Grandfather        CABG x 2   Cancer Neg Hx    Stroke Neg Hx    Prostate cancer Neg Hx     Physical Examination: Vitals:   04/15/23 1510  BP: 138/84    General: In no acute distress.   Awake, alert, oriented to person, place, and time.  Pupils equal round and reactive to light.  Facial tone is symmetric.     Strength: Side Biceps Triceps Deltoid Interossei Grip Wrist Ext. Wrist Flex.  R 5 5 5 5 5 5 5   L 5 5 5 5 5 5 5     Side Iliopsoas Quads Hamstring PF DF EHL  R 5 5 5 5 4 2   L 5 5 5 5 5 5     Reflexes are 2+ and symmetric at the biceps, brachioradialis.   Hoffman's is absent.    Bilateral upper and lower extremity sensation is intact to light touch.      Gait is normal.   Medical Decision Making  Imaging: none  I have personally reviewed the images and agree with the above interpretation.  Assessment and Plan: Mr. [name] has intermittent neck pain and more constant  numbness/tingling in right arm. Pain in right arm has improved. No weakness noted. Minimal relief with cervical ESI.    He has known cervical spondylosis with multilevel foraminal stenosis. Moderate right foraminal stenosis C3-C5. Severe right foraminal stenosis at C7-T1. Severe left foraminal stenosis C4-C6. Mild central stenosis C4-C6.    Neck pain is likely from underlying cervical spondylosis. Right arm pain likely from foraminal stenosis, especially at C7-T1.   He continues with weakness in right foot. He is s/p Right L4-L5 microdiscectomy on 11/06/22. No current back or leg pain.    Treatment options discussed with patient and following plan made:    - PT for  cervical spine. Given orders to take to [facility] with him. He travels there for work for weeks at a time.  - No relief with neurontin. He stopped it.  - No significant relief with cervical ESI.  - If no improvement with PT, may consider EMG and/or surgery options.  - Will schedule follow up with Dr. Katrinka Blazing in 2 months for recheck cervical and lumbar spine.   I spent a total of 20 minutes in face-to-face and non-face-to-face activities related to this patient's care today including review of outside records, review of imaging, review of symptoms, physical exam, discussion of differential diagnosis, discussion of treatment options, and documentation.   Thank you for involving me in the care of this patient.   [name] PA-C Dept. of Neurosurgery

## 2023-04-10 ENCOUNTER — Ambulatory Visit: Payer: 59 | Admitting: Orthopedic Surgery

## 2023-04-15 ENCOUNTER — Encounter: Payer: Self-pay | Admitting: Orthopedic Surgery

## 2023-04-15 ENCOUNTER — Ambulatory Visit (INDEPENDENT_AMBULATORY_CARE_PROVIDER_SITE_OTHER): Payer: BC Managed Care – PPO | Admitting: Orthopedic Surgery

## 2023-04-15 VITALS — BP 138/84 | Ht 74.0 in | Wt 250.0 lb

## 2023-04-15 DIAGNOSIS — M4802 Spinal stenosis, cervical region: Secondary | ICD-10-CM

## 2023-04-15 DIAGNOSIS — M47812 Spondylosis without myelopathy or radiculopathy, cervical region: Secondary | ICD-10-CM

## 2023-04-15 DIAGNOSIS — M5412 Radiculopathy, cervical region: Secondary | ICD-10-CM

## 2023-04-15 DIAGNOSIS — M4722 Other spondylosis with radiculopathy, cervical region: Secondary | ICD-10-CM

## 2023-04-15 NOTE — Patient Instructions (Signed)
It was so nice to see you today. Thank you so much for coming in.    Numbness/tingling in right arm is likely from your neck.   I gave you physical therapy orders to take with you to [facility]. Let me know if you need anything else.   If no improvement, may consider EMG (nerve test) or possible surgery.   Dr. Katrinka Blazing will see you back in April. Please do not hesitate to call if you have any questions or concerns. You can also message me in MyChart.   [name] PA-C [phone]     The physicians and staff at [facility] Neurosurgery at [facility] are committed to providing excellent care. You may receive a survey asking for feedback about your experience at our office. We value you your feedback and appreciate you taking the time to to fill it out. The [facility] leadership team is also available to discuss your experience in person, feel free to contact us [phone].

## 2023-07-01 ENCOUNTER — Ambulatory Visit: Payer: BC Managed Care – PPO | Admitting: Neurosurgery

## 2023-11-10 ENCOUNTER — Other Ambulatory Visit: Payer: Self-pay | Admitting: Family Medicine

## 2023-11-10 DIAGNOSIS — E785 Hyperlipidemia, unspecified: Secondary | ICD-10-CM

## 2024-01-21 ENCOUNTER — Other Ambulatory Visit: Payer: Self-pay | Admitting: Family Medicine

## 2024-01-21 DIAGNOSIS — E785 Hyperlipidemia, unspecified: Secondary | ICD-10-CM

## 2024-02-25 ENCOUNTER — Encounter: Payer: Self-pay | Admitting: Family Medicine

## 2024-02-25 ENCOUNTER — Ambulatory Visit: Admitting: Family Medicine

## 2024-02-25 VITALS — BP 138/88 | HR 92 | Temp 98.6°F | Ht 73.5 in | Wt 265.0 lb

## 2024-02-25 DIAGNOSIS — M545 Low back pain, unspecified: Secondary | ICD-10-CM

## 2024-02-25 DIAGNOSIS — E785 Hyperlipidemia, unspecified: Secondary | ICD-10-CM | POA: Diagnosis not present

## 2024-02-25 DIAGNOSIS — M79603 Pain in arm, unspecified: Secondary | ICD-10-CM

## 2024-02-25 DIAGNOSIS — Z23 Encounter for immunization: Secondary | ICD-10-CM

## 2024-02-25 DIAGNOSIS — J45909 Unspecified asthma, uncomplicated: Secondary | ICD-10-CM

## 2024-02-25 DIAGNOSIS — Z7189 Other specified counseling: Secondary | ICD-10-CM

## 2024-02-25 DIAGNOSIS — I10 Essential (primary) hypertension: Secondary | ICD-10-CM

## 2024-02-25 DIAGNOSIS — Z0000 Encounter for general adult medical examination without abnormal findings: Secondary | ICD-10-CM

## 2024-02-25 DIAGNOSIS — M7740 Metatarsalgia, unspecified foot: Secondary | ICD-10-CM

## 2024-02-25 DIAGNOSIS — F419 Anxiety disorder, unspecified: Secondary | ICD-10-CM

## 2024-02-25 LAB — TSH: TSH: 2.26 u[IU]/mL (ref 0.35–5.50)

## 2024-02-25 LAB — COMPREHENSIVE METABOLIC PANEL WITH GFR
ALT: 23 U/L (ref 0–53)
AST: 17 U/L (ref 0–37)
Albumin: 4.7 g/dL (ref 3.5–5.2)
Alkaline Phosphatase: 49 U/L (ref 39–117)
BUN: 19 mg/dL (ref 6–23)
CO2: 29 meq/L (ref 19–32)
Calcium: 9.7 mg/dL (ref 8.4–10.5)
Chloride: 101 meq/L (ref 96–112)
Creatinine, Ser: 0.9 mg/dL (ref 0.40–1.50)
GFR: 94.03 mL/min (ref >60.00)
Glucose, Bld: 92 mg/dL (ref 70–99)
Potassium: 4.2 meq/L (ref 3.5–5.1)
Sodium: 137 meq/L (ref 135–145)
Total Bilirubin: 0.7 mg/dL (ref 0.2–1.2)
Total Protein: 6.8 g/dL (ref 6.0–8.3)

## 2024-02-25 LAB — CBC WITH DIFFERENTIAL/PLATELET
Basophils Absolute: 0.1 K/uL (ref 0.0–0.1)
Basophils Relative: 0.8 % (ref 0.0–3.0)
Eosinophils Absolute: 0.6 K/uL (ref 0.0–0.7)
Eosinophils Relative: 8.8 % — ABNORMAL HIGH (ref 0.0–5.0)
HCT: 44.5 % (ref 39.0–52.0)
Hemoglobin: 15.1 g/dL (ref 13.0–17.0)
Lymphocytes Relative: 28.3 % (ref 12.0–46.0)
Lymphs Abs: 1.9 K/uL (ref 0.7–4.0)
MCHC: 34 g/dL (ref 30.0–36.0)
MCV: 89.5 fl (ref 78.0–100.0)
Monocytes Absolute: 0.5 K/uL (ref 0.1–1.0)
Monocytes Relative: 7.2 % (ref 3.0–12.0)
Neutro Abs: 3.7 K/uL (ref 1.4–7.7)
Neutrophils Relative %: 54.9 % (ref 43.0–77.0)
Platelets: 248 K/uL (ref 150.0–400.0)
RBC: 4.97 Mil/uL (ref 4.22–5.81)
RDW: 13.3 % (ref 11.5–15.5)
WBC: 6.7 K/uL (ref 4.0–10.5)

## 2024-02-25 LAB — LIPID PANEL
Cholesterol: 241 mg/dL — ABNORMAL HIGH (ref 0–200)
HDL: 35.3 mg/dL — ABNORMAL LOW (ref >39.00)
LDL Cholesterol: 150 mg/dL — ABNORMAL HIGH (ref 0–99)
NonHDL: 206.01
Total CHOL/HDL Ratio: 7
Triglycerides: 279 mg/dL — ABNORMAL HIGH (ref 0.0–149.0)
VLDL: 55.8 mg/dL — ABNORMAL HIGH (ref 0.0–40.0)

## 2024-02-25 MED ORDER — BECLOMETHASONE DIPROP HFA 80 MCG/ACT IN AERB: 1.0000 | INHALATION_SPRAY | Freq: Two times a day (BID) | RESPIRATORY_TRACT | 1 refills | Status: AC | PRN

## 2024-02-25 MED ORDER — LISINOPRIL 10 MG PO TABS: 10.0000 mg | ORAL_TABLET | Freq: Every day | ORAL | 3 refills | Status: AC

## 2024-02-25 MED ORDER — PRAVASTATIN SODIUM 80 MG PO TABS: 80.0000 mg | ORAL_TABLET | Freq: Every day | ORAL | 3 refills | Status: AC

## 2024-02-25 MED ORDER — ALBUTEROL SULFATE HFA 108 (90 BASE) MCG/ACT IN AERS: 1.0000 | INHALATION_SPRAY | Freq: Four times a day (QID) | RESPIRATORY_TRACT | 1 refills | Status: DC | PRN

## 2024-02-25 MED ORDER — FLUOXETINE HCL 10 MG PO CAPS: 10.0000 mg | ORAL_CAPSULE | Freq: Every day | ORAL | 3 refills | Status: AC

## 2024-02-25 NOTE — Progress Notes (Unsigned)
 CPE- See plan.  Routine anticipatory guidance given to patient.  See health maintenance.  The possibility exists that previously documented standard health maintenance information may have been brought forward from a previous encounter into this note.  If needed, that same information has been updated to reflect the current situation based on today's encounter.     Tetanus 2022 Flu 2025 PNA 23 2020.  Shingles dw pt.   covid vaccine prev done.  Prostate cancer screening and PSA options (with potential risks and benefits of testing vs not testing) were discussed along with recent recs/guidelines.  He declined testing PSA at this point. [facility]  D/w pt. Done 2017.  Living will d/w pt.  Wife designated if patient were incapacitated.     Rare etoh.    He had more R arm sx with R 4th and 5th finger sensation changes.  Prev MRI done and in PT.  He has seen the pain clinic in the meantime.  He is trying to put off surgery.    His back pain is better but still has altered sensation in the R leg.  His R toes are weaker with dorsiflexion but normal foot dorsiflexion.  Fall cautions d/w pt.    He has L 3rd distal metatarsal pain.  It improved with proximal cushioning at the visit.  See exam.   Elevated Cholesterol: Using medications without problems: yes Muscle aches: not from statin, see below.   Diet compliance: d/w pt.  Exercise: d/w pt.    He hasn't needed qvar  recently- work environment d/w pt- that was a trigger.  No recent albuterol  use.     Hypertension:               Using medication without problems or lightheadedness: yes Chest pain with exertion:no Edema:no Short of breath:no   Mood d/w pt.  No SI/HI.  Still on prozac  at baseline.  Compliant.  Mood is good.   He had MRI done per outside clinic re: arm pain with plan for f/u injection.  He has R hand paresthesias and pain.  Recently started back on gabapentin  since steroid use didn't  help.    Weight gain noted and discussed, affected by pain and exercise limitations.     PMH and SH reviewed   Meds, vitals, and allergies reviewed.    ROS: Per HPI.  Unless specifically indicated otherwise in HPI, the patient denies:   General: fever. Eyes: acute vision changes ENT: sore throat Cardiovascular: chest pain Respiratory: SOB GI: vomiting GU: dysuria Musculoskeletal: acute back pain Derm: acute rash Neuro: acute motor dysfunction Psych: worsening mood Endocrine: polydipsia Heme: bleeding Allergy: hayfever   GEN: nad, alert and oriented HEENT: mucous membranes moist NECK: supple w/o LA CV: rrr. PULM: ctab, no inc wob ABD: soft, +bs EXT: no edema SKIN: no acute rash Normal foot dorsiflexion B Not having R forearm paresthesia at OV.  Normal grip B hands. He has dropped left third metatarsal head with weightbearing.

## 2024-02-25 NOTE — Patient Instructions (Addendum)
 Try a metatarsal pad and see if that helps.   Take care.  Glad to see you. Go to the lab on the way out.   If you have mychart we'll likely use that to update you.    Reasonable to ask about possible injections if PT doesn't continue to help.

## 2024-02-26 DIAGNOSIS — M7740 Metatarsalgia, unspecified foot: Secondary | ICD-10-CM | POA: Insufficient documentation

## 2024-02-26 NOTE — Assessment & Plan Note (Signed)
 Improved in the meantime. He hasn't needed qvar  recently- work environment d/w pt- that was a trigger.  No recent albuterol  use.   Update me as needed.

## 2024-02-26 NOTE — Assessment & Plan Note (Signed)
 Prev MRI done and in PT.  He has seen the pain clinic in the meantime.  He is trying to put off surgery.   I asked him to check on possible injection if he levels off in terms of progress with PT.

## 2024-02-26 NOTE — Assessment & Plan Note (Signed)
 No SI/HI.  Still on prozac at baseline.  Compliant.  Mood is good.  Continue as is.

## 2024-02-26 NOTE — Assessment & Plan Note (Signed)
 Tetanus 2022 Flu 2025 PNA 23 2020.  Shingles dw pt.   covid vaccine prev done.  Prostate cancer screening and PSA options (with potential risks and benefits of testing vs not testing) were discussed along with recent recs/guidelines.  He declined testing PSA at this point. [facility]  D/w pt. Done 2017.  Living will d/w pt.  Wife designated if patient were incapacitated.

## 2024-02-26 NOTE — Assessment & Plan Note (Signed)
 Continue lisinopril

## 2024-02-26 NOTE — Assessment & Plan Note (Signed)
 With a drop left third metatarsal head.  He can try using a metatarsal pad and update me if that is not helping.

## 2024-02-26 NOTE — Assessment & Plan Note (Signed)
 Continue pravastatin 

## 2024-02-26 NOTE — Assessment & Plan Note (Signed)
 Living will d/w pt.  Wife designated if patient were incapacitated.   ?

## 2024-02-26 NOTE — Assessment & Plan Note (Signed)
 Fortunately his back pain is better but still has altered sensation in the R leg.  His R toes are weaker with dorsiflexion but normal foot dorsiflexion.  Fall cautions d/w pt.

## 2024-03-01 ENCOUNTER — Ambulatory Visit: Payer: Self-pay | Admitting: Family Medicine

## 2024-03-06 ENCOUNTER — Telehealth: Payer: Self-pay | Admitting: Family Medicine

## 2024-03-06 NOTE — Telephone Encounter (Signed)
 Copied from CRM #[id number]. Topic: Clinical - Prescription Issue >> Mar 05, 2024  3:33 PM Deaijah H wrote: Reason for CRM: Patients wife called in stating Dean Bishop did not receive albuterol  (VENTOLIN  HFA) 108 (90 Base) MCG/ACT inhaler in mail delivery.

## 2024-03-09 NOTE — Telephone Encounter (Signed)
 Spoke with Patient, he still has not received his albuterol . He says he will have his wife reach out to the pharmacy.

## 2024-03-13 NOTE — Telephone Encounter (Unsigned)
 Copied from CRM #[id number]. Topic: Clinical - Prescription Issue >> Mar 05, 2024  3:33 PM Deaijah H wrote: Reason for CRM: Patients wife called in stating Mr. Gardella did not receive albuterol  (VENTOLIN  HFA) 108 (90 Base) MCG/ACT inhaler in mail delivery. >> Mar 12, 2024  3:23 PM Franky GRADE wrote: Patient is calling because [facility] cancelled the prescription for albuterol  (VENTOLIN  HFA) 108 (90 Base) MCG/ACT inhaler [[id number]], they always cause them trouble for this. They would like to know if it could be sent to the local pharmacy  [id number] - [place], [place] - [address] AT [facility] [address] Phone: [phone]  Fax: [phone]

## 2024-03-16 MED ORDER — ALBUTEROL SULFATE HFA 108 (90 BASE) MCG/ACT IN AERS: 1.0000 | INHALATION_SPRAY | Freq: Four times a day (QID) | RESPIRATORY_TRACT | 1 refills | Status: AC | PRN

## 2024-03-16 NOTE — Addendum Note (Signed)
 Addended by: CLEATUS ARLYSS RAMAN on: 03/16/2024 03:31 PM   Modules accepted: Orders

## 2024-03-16 NOTE — Telephone Encounter (Signed)
 Sent. Thanks.
